# Patient Record
Sex: Male | Born: 1940
Health system: Southern US, Community
[De-identification: ages and names within clinical notes are randomized; demographics above are authoritative.]

## PROBLEM LIST (undated history)

## (undated) DIAGNOSIS — X32XXXA Exposure to sunlight, initial encounter: Secondary | ICD-10-CM

## (undated) DIAGNOSIS — I739 Peripheral vascular disease, unspecified: Secondary | ICD-10-CM

## (undated) DIAGNOSIS — M479 Spondylosis, unspecified: Secondary | ICD-10-CM

## (undated) DIAGNOSIS — I6529 Occlusion and stenosis of unspecified carotid artery: Secondary | ICD-10-CM

## (undated) DIAGNOSIS — I10 Essential (primary) hypertension: Secondary | ICD-10-CM

## (undated) DIAGNOSIS — M5430 Sciatica, unspecified side: Secondary | ICD-10-CM

## (undated) DIAGNOSIS — E785 Hyperlipidemia, unspecified: Secondary | ICD-10-CM

## (undated) DIAGNOSIS — K5790 Diverticulosis of intestine, part unspecified, without perforation or abscess without bleeding: Secondary | ICD-10-CM

## (undated) DIAGNOSIS — E538 Deficiency of other specified B group vitamins: Secondary | ICD-10-CM

## (undated) DIAGNOSIS — L570 Actinic keratosis: Secondary | ICD-10-CM

## (undated) DIAGNOSIS — I998 Other disorder of circulatory system: Secondary | ICD-10-CM

## (undated) DIAGNOSIS — K635 Polyp of colon: Secondary | ICD-10-CM

## (undated) HISTORY — DX: Occlusion and stenosis of unspecified carotid artery: I65.29

## (undated) HISTORY — DX: Spondylosis, unspecified: M47.9

## (undated) HISTORY — DX: Hyperlipidemia, unspecified: E78.5

## (undated) HISTORY — DX: Diverticulosis of intestine, part unspecified, without perforation or abscess without bleeding: K57.90

## (undated) HISTORY — DX: Actinic keratosis: L57.0

## (undated) HISTORY — DX: Sciatica, unspecified side: M54.30

## (undated) HISTORY — DX: Exposure to sunlight, initial encounter: X32.XXXA

## (undated) HISTORY — DX: Deficiency of other specified B group vitamins: E53.8

## (undated) HISTORY — DX: Essential (primary) hypertension: I10

## (undated) HISTORY — DX: Polyp of colon: K63.5

## (undated) HISTORY — PX: NECK MASS EXCISION: SHX2079

---

## 2008-10-04 ENCOUNTER — Ambulatory Visit: Payer: Self-pay | Admitting: Family Medicine

## 2008-10-04 DIAGNOSIS — F17200 Nicotine dependence, unspecified, uncomplicated: Secondary | ICD-10-CM

## 2008-10-04 DIAGNOSIS — K625 Hemorrhage of anus and rectum: Secondary | ICD-10-CM

## 2008-10-04 DIAGNOSIS — I10 Essential (primary) hypertension: Secondary | ICD-10-CM

## 2008-10-09 ENCOUNTER — Encounter: Admission: RE | Admit: 2008-10-09 | Discharge: 2008-10-09 | Payer: Self-pay | Admitting: Family Medicine

## 2008-10-15 ENCOUNTER — Ambulatory Visit: Payer: Self-pay | Admitting: Internal Medicine

## 2008-10-29 ENCOUNTER — Ambulatory Visit: Payer: Self-pay | Admitting: Internal Medicine

## 2008-10-29 ENCOUNTER — Encounter: Payer: Self-pay | Admitting: Internal Medicine

## 2008-10-31 ENCOUNTER — Encounter: Payer: Self-pay | Admitting: Internal Medicine

## 2008-11-01 ENCOUNTER — Ambulatory Visit: Payer: Self-pay | Admitting: Family Medicine

## 2008-11-29 ENCOUNTER — Encounter: Payer: Self-pay | Admitting: Family Medicine

## 2008-12-02 LAB — CONVERTED CEMR LAB
Albumin: 4.2 g/dL (ref 3.5–5.2)
Alkaline Phosphatase: 43 units/L (ref 39–117)
BUN: 18 mg/dL (ref 6–23)
CO2: 20 meq/L (ref 19–32)
Calcium: 9.3 mg/dL (ref 8.4–10.5)
Cholesterol: 265 mg/dL — ABNORMAL HIGH (ref 0–200)
Glucose, Bld: 91 mg/dL (ref 70–99)
HDL: 61 mg/dL (ref >39)
Potassium: 4.6 meq/L (ref 3.5–5.3)
Sodium: 141 meq/L (ref 135–145)
Total Protein: 7 g/dL (ref 6.0–8.3)
Triglycerides: 178 mg/dL — ABNORMAL HIGH (ref <150)

## 2009-01-29 ENCOUNTER — Ambulatory Visit: Payer: Self-pay | Admitting: Family Medicine

## 2009-01-29 DIAGNOSIS — L570 Actinic keratosis: Secondary | ICD-10-CM

## 2009-01-29 DIAGNOSIS — E785 Hyperlipidemia, unspecified: Secondary | ICD-10-CM | POA: Insufficient documentation

## 2009-01-30 ENCOUNTER — Encounter: Payer: Self-pay | Admitting: Family Medicine

## 2009-01-31 LAB — CONVERTED CEMR LAB
ALT: 11 units/L (ref 0–53)
Direct LDL: 171 mg/dL — ABNORMAL HIGH

## 2009-03-10 ENCOUNTER — Telehealth: Payer: Self-pay | Admitting: Family Medicine

## 2009-03-11 ENCOUNTER — Ambulatory Visit: Payer: Self-pay | Admitting: Family Medicine

## 2009-03-11 DIAGNOSIS — K649 Unspecified hemorrhoids: Secondary | ICD-10-CM | POA: Insufficient documentation

## 2009-03-11 DIAGNOSIS — L538 Other specified erythematous conditions: Secondary | ICD-10-CM

## 2009-04-08 ENCOUNTER — Telehealth: Payer: Self-pay | Admitting: Family Medicine

## 2010-03-27 ENCOUNTER — Encounter: Payer: Self-pay | Admitting: Family Medicine

## 2010-07-07 NOTE — Miscellaneous (Signed)
Summary: Fluzone at [facility]  Clinical Lists Changes  Observations: Added new observation of FLUVAXDUE: 03/27/2011 (03/27/2010 16:37) Added new observation of HDLNXTDUE: 11/29/2013 (03/27/2010 16:37) Added new observation of LDLNXTDUE: 11/29/2013 (03/27/2010 16:37) Added new observation of PSADUE: 11/29/2009 (03/27/2010 16:37) Added new observation of CREATNXTDUE: 11/29/2009 (03/27/2010 16:37) Added new observation of POTASSIUMDUE: 11/29/2009 (03/27/2010 16:37) Added new observation of FLU VAX: given (03/26/2010 16:37)     Flu Vaccine Result Date:  03/26/2010 Flu Vaccine Result:  given Flu Vaccine Next Due:  1 yr

## 2010-07-09 ENCOUNTER — Ambulatory Visit (INDEPENDENT_AMBULATORY_CARE_PROVIDER_SITE_OTHER): Payer: Medicare Other | Admitting: Emergency Medicine

## 2010-07-09 ENCOUNTER — Encounter: Payer: Self-pay | Admitting: Emergency Medicine

## 2010-07-09 DIAGNOSIS — I10 Essential (primary) hypertension: Secondary | ICD-10-CM

## 2010-07-10 ENCOUNTER — Encounter: Payer: Self-pay | Admitting: Family Medicine

## 2010-07-10 ENCOUNTER — Ambulatory Visit (INDEPENDENT_AMBULATORY_CARE_PROVIDER_SITE_OTHER): Payer: Medicare Other | Admitting: Family Medicine

## 2010-07-10 DIAGNOSIS — M5430 Sciatica, unspecified side: Secondary | ICD-10-CM

## 2010-07-10 DIAGNOSIS — I10 Essential (primary) hypertension: Secondary | ICD-10-CM

## 2010-07-10 DIAGNOSIS — E785 Hyperlipidemia, unspecified: Secondary | ICD-10-CM

## 2010-07-11 ENCOUNTER — Encounter: Payer: Self-pay | Admitting: Family Medicine

## 2010-07-13 ENCOUNTER — Ambulatory Visit: Payer: Medicare Other | Admitting: Family Medicine

## 2010-07-13 LAB — CONVERTED CEMR LAB
ALT: 14 units/L (ref 0–53)
AST: 17 units/L (ref 0–37)
CO2: 25 meq/L (ref 19–32)
Calcium: 9.1 mg/dL (ref 8.4–10.5)
Chloride: 102 meq/L (ref 96–112)
Cholesterol: 253 mg/dL — ABNORMAL HIGH (ref 0–200)
Creatinine, Ser: 1.44 mg/dL (ref 0.40–1.50)
PSA: 0.54 ng/mL (ref <=4.00)
Potassium: 4.7 meq/L (ref 3.5–5.3)
Sodium: 139 meq/L (ref 135–145)
Total CHOL/HDL Ratio: 2.9
Total Protein: 7.3 g/dL (ref 6.0–8.3)

## 2010-07-15 NOTE — Assessment & Plan Note (Signed)
Summary: f/u HTN   Vital Signs:  Patient profile:   70 year old male Height:      71.6 inches Weight:      199 pounds BMI:     27.39 O2 Sat:      98 % on Room air Pulse rate:   61 / minute BP sitting:   157 / 79  (left arm) Cuff size:   large  Vitals Entered By: Payton Spark CMA (July 10, 2010 11:40 AM)  O2 Flow:  Room air CC: F/U UC and BP   Primary Care Provider:  Seymour Bars DO  CC:  F/U UC and BP.  History of Present Illness: 70 yo WM with hx of HTN and hyperlipidemia presents 1 day after visit to UC for HIGH BP.  He ran out of meds 2 mos prior due to insurance no longer covering his RX for Lotrel.  He did not call here to let me know he needed a change.  He was having HAs but no blurry vision or SOB.  Denies palpitations or edema.    He was put back on Amlodopine + Benazepril separately which he took last night and this AM and is starting to feel better.  He is overdue for labs.    He also has R sided LBP with radiation down the leg on and off x mos.  No injury.  Taking a lot of ibuprofen.  Current Medications (verified): 1)  Bayer Aspirin Ec Low Dose 81 Mg Tbec (Aspirin) .Marland Kitchen.. 1 Tab By Mouth Daily 2)  Simvastatin 20 Mg Tabs (Simvastatin) .Marland Kitchen.. 1 Tab By Mouth Qhs 3)  Nystatin  Powd (Nystatin) .... Apply To Rash Bid 4)  Anucort-Hc 25 Mg Supp (Hydrocortisone Acetate) .Marland Kitchen.. 1 Supp Pr Two Times A Day As Needed Hemorrhoids 5)  Amlodipine Besylate 5 Mg Tabs (Amlodipine Besylate) .Marland Kitchen.. 1 By Mouth Daily 6)  Lotensin 20 Mg Tabs (Benazepril Hcl) .Marland Kitchen.. 1 By Mouth Daily  Allergies (verified): 1)  ! * Pollen  Past History:  Past Medical History: Reviewed history from 07/09/2010 and no changes required. HTN 2010 Stress Test: Spring 09, normal Hyperlipidemia  Social History: Reviewed history from 10/04/2008 and no changes required. Retired Engineer, maintenance. 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 HS degree.  Has 2 grown kids, son in [place] and daughter in [place] with 3  granddaughters. Enjoys fishing.  Lives alone. Smokes 1 ppd x 50 yrs. 1-2 ETOH/ day. Works out at [employer] 3 x a wk.  Review of Systems      See HPI  Physical Exam  General:  alert, well-developed, well-nourished, well-hydrated, and overweight-appearing.   Head:  normocephalic, atraumatic, and male-pattern balding.   Eyes:  pupils equal, pupils round, and pupils reactive to light.  wears glasses Mouth:  pharynx pink and moist.   Neck:  no masses.   Lungs:  normal respiratory effort and normal breath sounds.   Heart:  normal rate, regular rhythm, and no murmur.   Msk:  tender R sciatic notch, + R seated straight leg raise and tender R transverse process at L5-S1 Extremities:  no LE edema Neurologic:  gait normal.   Skin:  color normal.   Psych:  good eye contact, not anxious appearing, and not depressed appearing.     Impression & Recommendations:  Problem # 1:  ESSENTIAL HYPERTENSION, BENIGN (ICD-401.1) BP was HIGH w/o meds -- had stopped due to cost.  Slightly better today, 1 day after visit at [facility] for HTN.  He is doing well on Amlodopine 5 + Benazepril 20/ day but will add HCTZ daily, update labs today and schedule medicare physical in 3 wks. The following medications were removed from the medication list:    Lotrel 5-20 Mg Caps (Amlodipine besy-benazepril hcl) .Marland Kitchen... 1 tab by mouth once daily His updated medication list for this problem includes:    Amlodipine Besylate 5 Mg Tabs (Amlodipine besylate) .Marland Kitchen... 1 by mouth daily    Lotensin 20 Mg Tabs (Benazepril hcl) .Marland Kitchen... 1 by mouth daily    Hydrochlorothiazide 25 Mg Tabs (Hydrochlorothiazide) .Marland Kitchen... 1 tab by mouth daily  Orders: T-Comprehensive Metabolic Panel ([id number])  Problem # 2:  HYPERLIPIDEMIA (ICD-272.4) Overdue for labs.  Obtain today prior to sending in further RFs. His updated medication list for this problem includes:    Simvastatin 20 Mg Tabs (Simvastatin) .Marland Kitchen... 1 tab by mouth qhs  Orders: T-Lipid Profile  [phone])  Labs Reviewed: SGOT: 14 (01/30/2009)   SGPT: 11 (01/30/2009)   HDL:61 (11/29/2008)  LDL:168 (11/29/2008)  Chol:265 (11/29/2008)  Trig:178 (11/29/2008)  Problem # 3:  SCIATICA (ICD-724.3) R sided sciatica, chronic.  H/O given to pt on this condition.  He would benefit from regular exercise.  I'd like him to stop overuse of NSAIDs given HTN and change to Tylenol arthritis.  Plan further imaging/ tx if not improving. His updated medication list for this problem includes:    Bayer Aspirin Ec Low Dose 81 Mg Tbec (Aspirin) .Marland Kitchen... 1 tab by mouth daily  Complete Medication List: 1)  Bayer Aspirin Ec Low Dose 81 Mg Tbec (Aspirin) .Marland Kitchen.. 1 tab by mouth daily 2)  Simvastatin 20 Mg Tabs (Simvastatin) .Marland Kitchen.. 1 tab by mouth qhs 3)  Anucort-hc 25 Mg Supp (Hydrocortisone acetate) .Marland Kitchen.. 1 supp pr two times a day as needed hemorrhoids 4)  Amlodipine Besylate 5 Mg Tabs (Amlodipine besylate) .Marland Kitchen.. 1 by mouth daily 5)  Lotensin 20 Mg Tabs (Benazepril hcl) .Marland Kitchen.. 1 by mouth daily 6)  Hydrochlorothiazide 25 Mg Tabs (Hydrochlorothiazide) .Marland Kitchen.. 1 tab by mouth daily  Other Orders: T-PSA Total (Medicare Screen Only) ([id number])  Patient Instructions: 1)  Take Benazepril and HCTZ in the morning for high blood pressure and Amlodopine at night for high blood pressure. 2)  Update labs today. 3)  Will call you w/ results on Monday. 4)  Avoid OTC Ibuprofen or Aleve. 5)  Use Tylenol arthritis as needed for back pain/ sciatica and start exercising on a regular basis. 6)  REturn for a MEDICARE PHYSICAL in 3 wks. Prescriptions: HYDROCHLOROTHIAZIDE 25 MG TABS (HYDROCHLOROTHIAZIDE) 1 tab by mouth daily  #30 x 1   Entered and Authorized by:   Seymour Bars DO   Signed by:   Seymour Bars DO on 07/10/2010   Method used:   Electronically to        [facility]* (retail)       [address] [place], [place]  [address]       Ph: [phone]       Fax: [phone]   RxID:   [id number]    Orders  Added: 1)  T-Comprehensive Metabolic Panel [[id number]] 2)  T-Lipid Profile [phone] 3)  T-PSA Total (Medicare Screen Only) [[id number]] 4)  Est. Patient Level IV [93235]

## 2010-07-15 NOTE — Assessment & Plan Note (Signed)
Summary: High Blood Pressure?nh   Vital Signs:  Patient profile:   70 year old male Weight:      199 pounds BMI:     27.39 O2 Sat:      99 % on Room air Temp:     98.7 degrees F oral Pulse rate:   65 / minute Resp:     16 per minute BP sitting:   214 / 101  (left arm) Cuff size:   large  Vitals Entered By: Lajean Saver RN (July 09, 2010 2:39 PM)  O2 Flow:  Room air CC: hypertension Is Patient Diabetic? No Pain Assessment Patient in pain? no      Comments Patient has been off of his antihypertensive medication for about 2 months. Unable to see Dr. Cathey Endow today.    Allergies (verified): 1)  ! * Pollen  Past History:  Past Medical History: HTN 2010 Stress Test: Spring 09, normal Hyperlipidemia  Past Surgical History: none  Family History: Reviewed history from 10/04/2008 and no changes required. father died AMI, 86 brother alive, AMI at 73 mother alive, dementia 3 sisters alive, 1 disabled with spine problem  Social History: Reviewed history from 10/04/2008 and no changes required. Retired Engineer, maintenance. 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 HS degree.  Has 2 grown kids, son in [place] and daughter in [place] with 3 granddaughters. Enjoys fishing.  Lives alone. Smokes 1 ppd x 50 yrs. 1-2 ETOH/ day. Works out at [employer] 3 x a wk.   Physical Exam  General:  Well-developed,well-nourished,in no acute distress; alert,appropriate and cooperative throughout examination Head:  Normocephalic and atraumatic  Eyes:  PERRLA EOMI Lungs:  Normal respiratory effort, chest expands symmetrically. Lungs are clear to auscultation, no crackles or wheezes. Heart:  Normal rate and regular rhythm. S1 and S2 normal without gallop, murmur, click, rub or other extra sounds. Extremities:  FROM all 4 ext Neurologic:  CN2-12 intact, full strength and normal sensation all 4 extremities Skin:  Intact without suspicious lesions or rashes Psych:  Cognition and judgment  appear intact. Alert and cooperative with normal attention span and concentration. No apparent delusions, illusions, hallucinations  History of Present Illness Chief Complaint: hypertension History of Present Illness: He has been checking his BP at home on his machine and has noticed 200/100 frequently over the past 1-2 weeks.   He hasn't taken his BP med in almost 2 months due to the price.  He was on Amlodipine/Benzapril and the monthly price went from $5 to $90 and he cannot afford it.  He has no symptoms.  No CP, SOB, HA, dizziness, confusion, slurred speech, weakness.    Impression & Recommendations:  Problem # 1:  ESSENTIAL HYPERTENSION, BENIGN (ICD-401.1) Since he is unable to currently afford Lotrel, we separated it into Norvasc + Lotensin which should only be about $5 per month.  Since he has no alarming symptoms, he will restart his meds tonight.  We have him scheduled tomorrow morning with his PCP to recheck his BP.  I have also asked him to be fasting for the appt in case Dr. Cathey Endow would like to do any labwork.  I have written for only 1 month of meds and will leave it to his PCP to decide on a definitive treatment.  I have given him strict ER precautions (go to ER for any CP, SOB, confusion, blurry vision, weakness, or any stroke/MI symptoms).  I have also told him that if he is getting ready to run  out of any medicine, he needs to inform his PCP immediately so that a refill can be called in.  He understands and agrees.  His updated medication list for this problem includes:    Amlodipine Besylate 5 Mg Tabs (Amlodipine besylate) .Marland Kitchen... 1 by mouth daily    Lotensin 20 Mg Tabs (Benazepril hcl) .Marland Kitchen... 1 by mouth daily  Complete Medication List: 1)  Bayer Aspirin Ec Low Dose 81 Mg Tbec (Aspirin) .Marland Kitchen.. 1 tab by mouth daily 2)  Lotrel 5-20 Mg Caps (Amlodipine besy-benazepril hcl) .Marland Kitchen.. 1 tab by mouth once daily 3)  Simvastatin 20 Mg Tabs (Simvastatin) .Marland Kitchen.. 1 tab by mouth qhs 4)  Nystatin Powd  (Nystatin) .... Apply to rash bid 5)  Anucort-hc 25 Mg Supp (Hydrocortisone acetate) .Marland Kitchen.. 1 supp pr two times a day as needed hemorrhoids 6)  Amlodipine Besylate 5 Mg Tabs (Amlodipine besylate) .Marland Kitchen.. 1 by mouth daily 7)  Lotensin 20 Mg Tabs (Benazepril hcl) .Marland Kitchen.. 1 by mouth daily Prescriptions: LOTENSIN 20 MG TABS (BENAZEPRIL HCL) 1 by mouth daily  #30 x 0   Entered and Authorized by:   Hoyt Koch MD   Signed by:   Hoyt Koch MD on 07/09/2010   Method used:   Print then Give to Patient   RxID:   [id number] AMLODIPINE BESYLATE 5 MG TABS (AMLODIPINE BESYLATE) 1 by mouth daily  #30 x 0   Entered and Authorized by:   Hoyt Koch MD   Signed by:   Hoyt Koch MD on 07/09/2010   Method used:   Print then Give to Patient   RxID:   [id number]    Orders Added: 1)  New Patient Level III [72536]

## 2010-08-03 ENCOUNTER — Ambulatory Visit (INDEPENDENT_AMBULATORY_CARE_PROVIDER_SITE_OTHER): Payer: Medicare Other | Admitting: Family Medicine

## 2010-08-03 ENCOUNTER — Encounter: Payer: Self-pay | Admitting: Family Medicine

## 2010-08-03 DIAGNOSIS — Z0000 Encounter for general adult medical examination without abnormal findings: Secondary | ICD-10-CM

## 2010-08-03 DIAGNOSIS — R42 Dizziness and giddiness: Secondary | ICD-10-CM | POA: Insufficient documentation

## 2010-08-03 DIAGNOSIS — M5136 Other intervertebral disc degeneration, lumbar region: Secondary | ICD-10-CM | POA: Insufficient documentation

## 2010-08-04 ENCOUNTER — Other Ambulatory Visit: Payer: Self-pay | Admitting: Family Medicine

## 2010-08-04 ENCOUNTER — Ambulatory Visit
Admission: RE | Admit: 2010-08-04 | Discharge: 2010-08-04 | Disposition: A | Discharge status: Home / Self Care | Payer: Medicare Other | Source: Ambulatory Visit | Attending: Family Medicine | Admitting: Family Medicine

## 2010-08-04 DIAGNOSIS — R05 Cough: Secondary | ICD-10-CM

## 2010-08-04 DIAGNOSIS — M5430 Sciatica, unspecified side: Secondary | ICD-10-CM

## 2010-08-04 DIAGNOSIS — R0789 Other chest pain: Secondary | ICD-10-CM

## 2010-08-04 DIAGNOSIS — M79604 Pain in right leg: Secondary | ICD-10-CM

## 2010-08-04 DIAGNOSIS — R059 Cough, unspecified: Secondary | ICD-10-CM

## 2010-08-11 ENCOUNTER — Encounter: Payer: Self-pay | Admitting: Family Medicine

## 2010-08-12 ENCOUNTER — Encounter (INDEPENDENT_AMBULATORY_CARE_PROVIDER_SITE_OTHER): Payer: Medicare Other

## 2010-08-12 ENCOUNTER — Encounter: Payer: Self-pay | Admitting: Family Medicine

## 2010-08-12 DIAGNOSIS — I6529 Occlusion and stenosis of unspecified carotid artery: Secondary | ICD-10-CM

## 2010-08-12 DIAGNOSIS — I739 Peripheral vascular disease, unspecified: Secondary | ICD-10-CM

## 2010-08-13 ENCOUNTER — Telehealth: Payer: Self-pay | Admitting: Family Medicine

## 2010-08-18 NOTE — Miscellaneous (Signed)
Summary: Orders Update  Clinical Lists Changes  Orders: Added new Test order of Arterial Duplex Lower Extremity (Arterial Duplex Low) - Signed 

## 2010-08-18 NOTE — Progress Notes (Signed)
Summary: Vascular and Vein appt info  Phone Note From Other Clinic   Caller: Receptionist Reason for Call: Schedule Patient Appt Summary of Call: Patient has been scheduled with Vascular & Vein located on [facility] in [place] for 08/28/10 Friday at 11:15 and I spoke with Drinda Butts from that office and faxed her all the info.Marland KitchenMarland KitchenMichaelle Copas  August 13, 2010 12:06 PM  Initial call taken by: Michaelle Copas,  August 13, 2010 12:07 PM  Follow-up for Phone Call        OK.  Michelle, pls let pt know that if he develops dizziness, lightheadedness or chest pain, SOB, he needs to go to the ED.   Follow-up by: Seymour Bars DO,  August 13, 2010 12:08 PM     Appended Document: Vascular and Vein appt info Pt states he is experiencing increased dizziness, lightheadedness and numbness. I advised Pt to go to ED today. Pt verbalized understanding and importance of going. Pt did agree to go.

## 2010-08-18 NOTE — Miscellaneous (Signed)
Summary: Orders Update  Clinical Lists Changes  Orders: Added new Test order of Carotid Duplex (Carotid Duplex) - Signed 

## 2010-08-18 NOTE — Assessment & Plan Note (Signed)
Summary: Medicare Wellness   Vital Signs:  Patient profile:   70 year old male Height:      71.6 inches Weight:      196 pounds BMI:     26.98 O2 Sat:      97 % on Room air Pulse rate:   67 / minute BP sitting:   130 / 79  (left arm) Cuff size:   large  Vitals Entered By: Payton Spark CMA (August 03, 2010 1:31 PM)  O2 Flow:  Room air CC: Medicare Wellness Exam  Vision Screening:Left eye with correction: 20 / 25 Right eye with correction: 20 / 200 Both eyes with correction: 20 / 25        Vision Entered By: Payton Spark CMA (August 03, 2010 1:31 PM)   Primary Care Provider:  Seymour Bars DO  CC:  Medicare Wellness Exam.  History of Present Illness: I have personally reviewed the Medicare Annual Wellness questionnaire and have noted 1.   The patient's medical and social history 2.   Their use of alcohol, tobacco or illicit drugs 3.   Their current medications and supplements 4.   The patient's functional ability including ADL's, fall risks, home safety risks and hearing or visual             impairment. 5.   Diet and physical activities 6.   Evidence for depression or mood disorders The patients weight, height, BMI and visual acuity have been recorded in the chart I have made referrals, counseling and provided education to the patient based review of the above and I have provided the pt with a written personalized care plan for preventive services.     Habits & Providers  Alcohol-Tobacco-Diet     Tobacco Status: never     Tobacco Counseling: to quit use of tobacco products  Problems Prior to Update: 1)  Sciatica  (ICD-724.3) 2)  Intertrigo  (ICD-695.89) 3)  Hemorrhoids  (ICD-455.6) 4)  Solar Keratosis  (ICD-702.0) 5)  Hyperlipidemia  (ICD-272.4) 6)  Special Screening Malignant Neoplasm of Prostate  (ICD-V76.44) 7)  Essential Hypertension, Benign  (ICD-401.1) 8)  Rectal Bleeding  (ICD-569.3) 9)  Cigarette Smoker  (ICD-305.1)  Medications Prior to  Update: 1)  Bayer Aspirin Ec Low Dose 81 Mg Tbec (Aspirin) .Marland Kitchen.. 1 Tab By Mouth Daily 2)  Pravastatin Sodium 80 Mg Tabs (Pravastatin Sodium) .Marland Kitchen.. 1 Tab By Mouth Qhs 3)  Anucort-Hc 25 Mg Supp (Hydrocortisone Acetate) .Marland Kitchen.. 1 Supp Pr Two Times A Day As Needed Hemorrhoids 4)  Amlodipine Besylate 5 Mg Tabs (Amlodipine Besylate) .Marland Kitchen.. 1 By Mouth Daily 5)  Lotensin 20 Mg Tabs (Benazepril Hcl) .Marland Kitchen.. 1 By Mouth Daily 6)  Hydrochlorothiazide 25 Mg Tabs (Hydrochlorothiazide) .Marland Kitchen.. 1 Tab By Mouth Daily  Current Medications (verified): 1)  Bayer Aspirin Ec Low Dose 81 Mg Tbec (Aspirin) .Marland Kitchen.. 1 Tab By Mouth Daily 2)  Pravastatin Sodium 80 Mg Tabs (Pravastatin Sodium) .Marland Kitchen.. 1 Tab By Mouth Qhs 3)  Anamantle Hc Gel .... Apply To Hemorrhoids 1-2 X A Day As Needed 4)  Amlodipine Besylate 5 Mg Tabs (Amlodipine Besylate) .Marland Kitchen.. 1 By Mouth Daily 5)  Lotensin 20 Mg Tabs (Benazepril Hcl) .Marland Kitchen.. 1 By Mouth Daily 6)  Hydrochlorothiazide 25 Mg Tabs (Hydrochlorothiazide) .Marland Kitchen.. 1 Tab By Mouth Daily  Allergies: 1)  ! * Pollen  Directives (verified): 1)  Discussed - No Decision Made   Past History:  Past Medical History: Last updated: 07/09/2010 HTN 2010 Stress Test: Spring 09, normal Hyperlipidemia  Past Surgical History: Last updated: 07/09/2010 none  Family History: Last updated: 2008-10-24 father died AMI, 41 brother alive, AMI at 24 mother alive, dementia 3 sisters alive, 1 disabled with spine problem  Social History: Last updated: 10-24-2008 Retired Engineer, maintenance. 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 HS degree.  Has 2 grown kids, son in [place] and daughter in [place] with 3 granddaughters. Enjoys fishing.  Lives alone. Smokes 1 ppd x 50 yrs. 1-2 ETOH/ day. Works out at [employer] 3 x a wk.  Risk Factors: Smoking Status: never (08/03/2010)  Social History: Smoking Status:  never  Physical Exam  General:  alert, well-developed, well-nourished, and well-hydrated.   Head:  normocephalic,  atraumatic, and male-pattern balding.   Eyes:  pupils equal, pupils round, and pupils reactive to light.   Ears:  no external deformities.   Mouth:  pharynx pink and moist.   Neck:  no masses.  no audible carotid bruits Lungs:  normal respiratory effort and normal breath sounds.   Heart:  normal rate, regular rhythm, and no murmur.   Abdomen:  soft, non-tender, normal bowel sounds, no distention, no masses, no guarding, no hepatomegaly, and no splenomegaly.  no AA bruits Rectal:  ext hemorrhoidal tags with tenderness on DRE, hemoccult neg Prostate:  no nodules, no asymmetry, and 1+ enlarged.   Pulses:  2+ radial and pedal pulses Extremities:  no LE edema Neurologic:  gait normal.   Skin:  color normal.   Psych:  good eye contact, not anxious appearing, and not depressed appearing.     Impression & Recommendations:  Problem # 1:  HEALTH MAINTENANCE EXAM (ICD-V70.0)  Orders: Medicare -1st Annual Wellness Visit [phone]) EKG w/ Interpretation (93000)  Complete Medication List: 1)  Bayer Aspirin Ec Low Dose 81 Mg Tbec (Aspirin) .Marland Kitchen.. 1 tab by mouth daily 2)  Pravastatin Sodium 80 Mg Tabs (Pravastatin sodium) .Marland Kitchen.. 1 tab by mouth qhs 3)  Anamantle Hc Gel  .... Apply to hemorrhoids 1-2 x a day as needed 4)  Amlodipine Besylate 5 Mg Tabs (Amlodipine besylate) .Marland Kitchen.. 1 by mouth daily 5)  Lotensin 20 Mg Tabs (Benazepril hcl) .Marland Kitchen.. 1 by mouth daily 6)  Hydrochlorothiazide 25 Mg Tabs (Hydrochlorothiazide) .Marland Kitchen.. 1 tab by mouth daily  Other Orders: T-DG Chest 2 View (09811) T-DG Lumbar Spine 2-3 Views (72100) T-Vascular Study-Carotids Complete (91478) T-Arterial Duplex Lower Complete (29562)  Patient Instructions: 1)  I have provided you with a copy of your personalized plan for preventive services. Please take the time to review along with your updated medication list. Prescriptions: AMLODIPINE BESYLATE 5 MG TABS (AMLODIPINE BESYLATE) 1 by mouth daily  #30 x 6   Entered and Authorized by:    Seymour Bars DO   Signed by:   Seymour Bars DO on 08/03/2010   Method used:   Electronically to        [facility]* (retail)       [address] [place], [place]  [address]       Ph: [phone]       Fax: [phone]   RxID:   [id number] LOTENSIN 20 MG TABS (BENAZEPRIL HCL) 1 by mouth daily  #30 x 6   Entered and Authorized by:   Seymour Bars DO   Signed by:   Seymour Bars DO on 08/03/2010   Method used:   Electronically to        [facility]* (retail)  [address] [place], [place]  [address]       Ph: [phone]       Fax: [phone]   RxID:   [id number] ANAMANTLE HC GEL apply to hemorrhoids 1-2 x a day as needed  #1 tube x 1   Entered and Authorized by:   Seymour Bars DO   Signed by:   Seymour Bars DO on 08/03/2010   Method used:   Printed then faxed to ...       [facility]* (retail)       [address] [place], [place]  [address]       Ph: [phone]       Fax: [phone]   RxID:   [id number]    Orders Added: 1)  T-DG Chest 2 View [71020] 2)  T-DG Lumbar Spine 2-3 Views [72100] 3)  T-Vascular Study-Carotids Complete [93880] 4)  T-Arterial Duplex Lower Complete [93925] 5)  Medicare -1st Annual Wellness Visit [G0438] 6)  EKG w/ Interpretation [93000]

## 2010-08-24 ENCOUNTER — Other Ambulatory Visit: Payer: Self-pay | Admitting: Family Medicine

## 2010-08-24 DIAGNOSIS — R42 Dizziness and giddiness: Secondary | ICD-10-CM

## 2010-08-24 DIAGNOSIS — I739 Peripheral vascular disease, unspecified: Secondary | ICD-10-CM

## 2010-08-24 DIAGNOSIS — M79609 Pain in unspecified limb: Secondary | ICD-10-CM

## 2010-08-28 ENCOUNTER — Encounter (INDEPENDENT_AMBULATORY_CARE_PROVIDER_SITE_OTHER): Payer: Medicare Other | Admitting: Vascular Surgery

## 2010-08-28 DIAGNOSIS — I6529 Occlusion and stenosis of unspecified carotid artery: Secondary | ICD-10-CM

## 2010-08-31 ENCOUNTER — Telehealth: Payer: Self-pay | Admitting: Cardiovascular Disease

## 2010-08-31 NOTE — Telephone Encounter (Signed)
Patient needs an appointment sooner with Dr. Clifton James. Scheduled for 09/02/10 @ 2:15pm.

## 2010-08-31 NOTE — Telephone Encounter (Signed)
Pt wants to know test results and pt states he continues to have dizziness and and would like to be seen sooner.

## 2010-08-31 NOTE — Consult Note (Signed)
NEW PATIENT CONSULTATION  [name], [name] DOB:  1940-09-05                                       08/28/2010 ZOXWR#:[id number]  REASON FOR CONSULTATION:  Right internal carotid artery stenosis.  HISTORY OF PRESENT ILLNESS:  This is a 70 year old gentleman that recently had seen a screening examination of all his vascular beds and was then told that he has an elevated velocity in his right internal carotid artery.  At that point the patient was referred for evaluation by vascular surgery.  He is unclear whether he truly has a prior history of TIA or stroke.  He previously has been seen in the ER before and told that he had possibly had a stroke but he has never had residual deficits from that.  At this point he notes some dizziness with movements and some bilaterally blurred vision and also some right shoulder girdle numbness along with bilateral peripheral lower extremity neuropathy and occasional paresthesias.  He denies history that is consistent with amaurosis fugax or blindness.  He does note occasionally right facial numbness without droop and denies any hemiplegia.  He has never had any expressive or receptive aphasia.  His carotid disease risk factors included hyperlipidemia, hypertension and he does not have diabetes and he is also a smoker.  Risk factor management includes use of a statin and aspirin and he is on an antihypertensive regimen.  On his neurologic review of systems he noted dizziness, no vertigo.  No seizures.  He notes the paresthesias.  No headache.  No history of dementia or Parkinson's or Alzheimer's.  PAST MEDICAL HISTORY:  Past medical history includes diabetes, sciatica, intertrigo, hemorrhoids, solar keratosis, hyperlipidemia, hypertension and rectal bleeding.  PAST SURGICAL HISTORY:  Past surgical history includes some type of neck cystectomy.  SOCIAL HISTORY:  He is an active smoker one pack a day with greater than 50 pack year  history, drinks maybe one to two beers a day and denies any illicit drug use.  FAMILY HISTORY:  His father died of acute MI at age 70.  Mother is demented but alive.  MEDICATIONS:  Medications include aspirin, Zocor, amlodipine, Lotensin, hydrochlorothiazide, hydrochlorothiazide, Tylenol.  ALLERGIES:  No known drug allergies.  REVIEW OF SYSTEMS:  He noted dizziness, change in eye site, pain in legs with walking, blood clot in vein, arthritis, joint pain, muscle pain, chest tightness, pressure, shortness of breath, blood in stools and constipation.  Otherwise, the rest of his review is negative.  PHYSICAL EXAMINATION:  On the right arm he had a blood pressure 147/75, on the left 151/86, heart rate 67, respirations were 22. On examination he was alert and oriented x3, well-developed, well- nourished, in no apparent distress. On head exam normocephalic, atraumatic. On ENT exam hearing grossly intact.  Nares without any erythema or drainage.  Oropharynx without any erythema or exudate. On eye exam pupils were equal, round, reactive to light.  Extraocular movements were intact. On neck exam supple neck with no nuchal rigidity with no palpable lymphadenopathy. Pulmonary exam symmetric expansion.  Good air movement.  No rales, rhonchi or wheezing. Cardiac exam:  Regular rate and rhythm.  Normal S1, S2.  No murmurs, rubs, thrills, gallops. Vascular exam:  The upper extremities had easily palpable pulses and his carotids had no bruits on either side and had palpable pulses.  The aorta was not palpable due to  his obesity.  Femoral pulses were palpable bilaterally.  However, I did not feel any popliteal, dorsalis pedis pulses. On GI exam he had a soft abdomen, mildly obese, nontender, nondistended, no guarding, no rebound, no hepatosplenomegaly, no masses.  No costovertebral angle tenderness. Musculoskeletal exam:  He had 5/5 strength.  There are no obvious ischemic changes in any  extremity. On neuro exam cranial nerves II-XII were intact.  His motor exam was listed above.  He did have mildly decreased sensation in his lower extremities but he did have intact light touch in both extremities. Even with moving him from a supine position to a sitting position he did have some nystagmus in his eyes.  This was not a true Hallpike maneuver, however. On psych exam his judgment appears to be intact but his mood and affect is somewhat anxious. On skin exam he has some actinic keratoses on his face and arms. Otherwise no other rashes were noted.  The extremities were as listed above. On lymphatic exam no cervical, axillary, or inguinal lymphadenopathy.  I reviewed outside documentation 15 pages.  There is a carotid duplex that was read as 80-99% stenosis.  We repeated this study to verify and also determine the level of the bifurcation.  Peak systolic was up at 386 and on the end diastolic this was in the order of 136 cm/sec.  The plaque appears to extend to the bifurcation in both carotid arteries. This is consistent with an 80-90% stenosis.  The bifurcation is also noted to be in a normal location about the hyoid notch.  MEDICAL DECISION MAKING:  This is a 70 year old gentleman who has an asymptomatic right internal carotid artery stenosis.  It is not clear to me whether or not he truly had a stroke previously.  Regardless he meets the criteria for intervention as he has a greater than 80% stenosis.  At this point he meets criteria for intervention based on ACAS data.  Prior to proceeding with such I routinely obtain coronary artery risk factor stratification and preoperative optimization.  He is already being managed by [facility] health care, so will send him to get reevaluated by his cardiologist there hopefully within the next 2 weeks and then will have him follow-up.  At that point I will go ahead and schedule the carotid endarterectomy.    [name],  MD Electronically Signed  BLC/MEDQ  D:  08/28/2010  T:  08/31/2010  Job:  [id number]

## 2010-09-01 ENCOUNTER — Encounter: Payer: Self-pay | Admitting: *Deleted

## 2010-09-02 ENCOUNTER — Telehealth: Payer: Self-pay | Admitting: Physician Assistant

## 2010-09-02 ENCOUNTER — Encounter: Payer: Self-pay | Admitting: Cardiovascular Disease

## 2010-09-02 ENCOUNTER — Encounter: Payer: Self-pay | Admitting: Cardiology

## 2010-09-02 ENCOUNTER — Ambulatory Visit (INDEPENDENT_AMBULATORY_CARE_PROVIDER_SITE_OTHER): Payer: Medicare Other | Admitting: Cardiovascular Disease

## 2010-09-02 DIAGNOSIS — R079 Chest pain, unspecified: Secondary | ICD-10-CM | POA: Insufficient documentation

## 2010-09-02 DIAGNOSIS — Z01810 Encounter for preprocedural cardiovascular examination: Secondary | ICD-10-CM | POA: Insufficient documentation

## 2010-09-02 LAB — CBC WITH DIFFERENTIAL/PLATELET
Eosinophils Relative: 3 % (ref 0–5)
HCT: 40 % (ref 39.0–52.0)
Hemoglobin: 13.8 g/dL (ref 13.0–17.0)
Lymphocytes Relative: 21 % (ref 12–46)
MCHC: 34.5 g/dL (ref 30.0–36.0)
MCV: 96.6 fL (ref 78.0–100.0)
Monocytes Absolute: 0.9 10*3/uL (ref 0.1–1.0)
Monocytes Relative: 10 % (ref 3–12)
Neutro Abs: 5.8 10*3/uL (ref 1.7–7.7)
WBC: 9 10*3/uL (ref 4.0–10.5)

## 2010-09-02 LAB — BASIC METABOLIC PANEL
CO2: 26 mEq/L (ref 19–32)
Calcium: 9.8 mg/dL (ref 8.4–10.5)
Potassium: 5.2 mEq/L (ref 3.5–5.3)
Sodium: 137 mEq/L (ref 135–145)

## 2010-09-02 LAB — PROTIME-INR: INR: 0.9 (ref <1.50)

## 2010-09-02 NOTE — Patient Instructions (Signed)
Your physician recommends that you schedule a follow-up appointment in 1 month with Dr. Clifton James. Your physician has requested that you have a cardiac catheterization. Cardiac catheterization is used to diagnose and/or treat various heart conditions. Doctors may recommend this procedure for a number of different reasons. The most common reason is to evaluate chest pain. Chest pain can be a symptom of coronary artery disease (CAD), and cardiac catheterization can show whether plaque is narrowing or blocking your heart's arteries. This procedure is also used to evaluate the valves, as well as measure the blood flow and oxygen levels in different parts of your heart. For further information please visit [email or web address] Please follow instruction sheet, as given.

## 2010-09-02 NOTE — Assessment & Plan Note (Signed)
Plan cath for evaluation of CAD. See above.

## 2010-09-02 NOTE — Progress Notes (Signed)
History of Present Illness:69 yo WM with history of tobacco abuse, HTN, hyperlipidemia and recently diagnosed carotid artery disease. He was found to have severe disease in the right ICA. He has recently been seen by Dr. Imogene Burn with Vascular Surgery. Plans are being made for right carotid endarterectomy in the near future. I am asked to see him for cardiac risk assessment prior to the planned surgery. He has had no prior cardiac issues but he does have a strong family history of CAD including his father and brother. He describes tightness across his chest. At rest and  worsened at times with exertion. He has not performed much exercise lately because of his back issues. His chest pain lasts for 1-2 hours, no associated SOB, palpitations, diaphoresis or nausea. No radiation of pain. He continues to smoke, currently 2-3 cigarettes per day.    Past Medical History  Diagnosis Date  . Hypertension   . Hyperlipidemia     Past Surgical History  Procedure Date  . Neck mass excision     Current Outpatient Prescriptions  Medication Sig Dispense Refill  . Acetaminophen (TYLENOL ARTHRITIS PAIN PO) Take by mouth as needed.        Marland Kitchen amLODipine (NORVASC) 5 MG tablet Take 5 mg by mouth daily.        . Aspirin (ASPIR-81 PO) Take by mouth daily.        . benazepril (LOTENSIN) 20 MG tablet Take 20 mg by mouth daily.        . hydrochlorothiazide 25 MG tablet Take 25 mg by mouth daily.        . pravastatin (PRAVACHOL) 80 MG tablet Take 80 mg by mouth at bedtime.        Marland Kitchen DISCONTD: amLODipine (NORVASC) 2.5 MG tablet Take 2.5 mg by mouth daily.       Marland Kitchen DISCONTD: Lidocaine-Hydrocortisone Ace (ANAMANTLE HC RE) Place rectally as directed.          Allergies  Allergen Reactions  . Pollen Extract     History   Social History  . Marital Status: Widowed    Spouse Name: N/A    Number of Children: N/A  . Years of Education: N/A   Occupational History  . Not on file.   Social History Main Topics  . Smoking  status: Current Everyday Smoker    Types: Cigarettes  . Smokeless tobacco: Not on file  . Alcohol Use: Yes  . Drug Use: No  . Sexually Active: Not on file   Other Topics Concern  . Not on file   Social History Narrative  . No narrative on file    Family History  Problem Relation Age of Onset  . Dementia Mother   . Heart attack Father   . Heart attack Brother     Review of Systems:  Positive for chest pain. No SOB, palpitations, dizziness,  near syncope or syncope.  No PND, orthopnea, or Lower extremity edema.   BP 152/70  Pulse 74  Resp 18  Ht 5\' 11"  (1.803 m)  Wt 191 lb 12.8 oz (87 kg)  BMI 26.75 kg/m2  Physical Examination: General: Well developed, well nourished, NAD HEENT: OP clear, mucus membranes moist SKIN: warm, dry. No rashes. Neuro: No focal deficits Musculoskeletal: Muscle strength 5/5 all ext Psychiatric: Mood and affect normal Neck: No JVD, no carotid bruits, no thyromegaly, no lymphadenopathy. Lungs:Clear bilaterally, no wheezes, rhonci, crackles Cardiovascular: Regular rate and rhythm. No murmurs, gallops or rubs. Abdomen:Soft. Bowel sounds present. Non-tender.  Extremities: No lower extremity edema. Pulses are 2 + in the bilateral DP/PT.  EKG:NSR, rate 64 bpm. Incomplete RBBB  ABI normal by recent study.  Carotid dopplers: RICA 80-99%. LICA 40-59%.

## 2010-09-02 NOTE — Assessment & Plan Note (Signed)
Elevated. Will reassess at next visit. No changes today.

## 2010-09-02 NOTE — Telephone Encounter (Signed)
Received call from [facility] labs re: stat labs. Cr is 1.8. Reviewed Dr. Gibson Ramp OV from today. The patient was set up for a cath that is supposed to happen tomorrow. Discussed case with Dr. Myrtis Ser; we have told the patient it may not be the best timing for his cath with his kidney function. We instructed him to hold his HCTZ and Benazepril tomorrow and we will discuss with Dr. Clifton James about repeat labs/timing of upcoming cath. The patient was instructed to come to the ER if he develops CP/SOB in the interim. He expressed understanding and gratitude about update. Told him to call our office if he has not heard anything by the afternoon tomorrow.

## 2010-09-02 NOTE — Assessment & Plan Note (Signed)
Squeezing type chest pain with h/o tobacco abuse, HTN and hyperlipidemia with recent diagnosis of carotid artery disease. Strong family history of CAD. I think that a cardiac cath is indicated before proceeding with the planned surgery. He has a high probability of CAD. Will arrange left heart cath at [facility] in the outpatient cath lab tomorrow. Will get labs today including BMET, CBC and coags.

## 2010-09-02 NOTE — Progress Notes (Signed)
Addended by: Floreen Comber on: 09/02/2010 04:54 PM   Modules accepted: Orders

## 2010-09-03 NOTE — Progress Notes (Signed)
Reviewed and d/w pt. cdm

## 2010-09-03 NOTE — Progress Notes (Signed)
Labs reviewed and d/w pt on phone. cdm

## 2010-09-03 NOTE — Telephone Encounter (Signed)
Patient will come for a repeat BMP on Monday 09/07/10

## 2010-09-03 NOTE — Telephone Encounter (Signed)
I called the pt's cell phone this am and left a voice message. Cath cancelled because his creatinine was 1.8. I would like to hold his HCTZ and Benazepril and repeat BMET early next week, Monday. If creatinine better, would plan his cath for Tuesday or Wednesday of next week. Whitney, can you follow up with him today and arrange the BMET for next week? Thanks, chris

## 2010-09-04 ENCOUNTER — Ambulatory Visit (INDEPENDENT_AMBULATORY_CARE_PROVIDER_SITE_OTHER): Payer: Medicare Other | Admitting: Vascular Surgery

## 2010-09-04 ENCOUNTER — Telehealth: Payer: Self-pay | Admitting: Cardiovascular Disease

## 2010-09-04 DIAGNOSIS — I6529 Occlusion and stenosis of unspecified carotid artery: Secondary | ICD-10-CM

## 2010-09-04 NOTE — Telephone Encounter (Signed)
Pt had an appt today at [facility] and his BP was 185/102 and Dr Imogene Burn wanted him to call the office to check and see if there was something that he needed to do or take until his blood work on Monday

## 2010-09-04 NOTE — Telephone Encounter (Signed)
Spoke with Dr. Clifton James, we will increase Norvasc to 10mg  by mouth daily. I advised patient to recheck his BP at home this afternoon about an hour after taking the extra Norvasc & to call us back if his BP remains elevated.

## 2010-09-05 NOTE — Procedures (Unsigned)
CAROTID DUPLEX EXAM  INDICATION:  Right carotid stenosis.  HISTORY: Diabetes:  No. Cardiac:  No. Hypertension:  Yes. Smoking:  Yes. Previous Surgery:  No. CV History:  Asymptomatic. Amaurosis Fugax No, Paresthesias No, Hemiparesis No.                                      RIGHT               LEFT Brachial systolic pressure: Brachial Doppler waveforms: Vertebral direction of flow:        Antegrade DUPLEX VELOCITIES (cm/sec) CCA peak systolic                   116 ECA peak systolic                   265 ICA peak systolic                   P = 86, P/M = 350 ICA end diastolic                   P = 27, P/M = 136 PLAQUE MORPHOLOGY:                  Mixed PLAQUE AMOUNT:                      Large PLAQUE LOCATION:                    ICA/ECA/bifurcation  IMPRESSION: 1. Right-side-only repeat study. 2. Right internal carotid artery 80% to 99% stenosis with main focal     areas of severity extending from approximately 1.5 cm into the     internal carotid artery to approximately 3 cm into the internal     carotid artery. 3. Right external carotid artery stenosis.  ___________________________________________ [name], MD  AS/MEDQ  D:  08/28/2010  T:  08/28/2010  Job:  [id number]

## 2010-09-07 ENCOUNTER — Ambulatory Visit: Payer: Medicare Other | Admitting: Cardiovascular Disease

## 2010-09-07 ENCOUNTER — Other Ambulatory Visit: Payer: Self-pay | Admitting: Cardiovascular Disease

## 2010-09-07 ENCOUNTER — Telehealth: Payer: Self-pay | Admitting: Cardiovascular Disease

## 2010-09-07 ENCOUNTER — Other Ambulatory Visit: Payer: Medicare Other | Admitting: *Deleted

## 2010-09-07 LAB — BASIC METABOLIC PANEL WITH GFR
BUN: 17 mg/dL (ref 6–23)
CO2: 21 meq/L (ref 19–32)
Calcium: 9.4 mg/dL (ref 8.4–10.5)
Chloride: 102 meq/L (ref 96–112)
Creat: 1.28 mg/dL (ref 0.40–1.50)
Glucose, Bld: 94 mg/dL (ref 70–99)
Potassium: 4.5 meq/L (ref 3.5–5.3)
Sodium: 139 meq/L (ref 135–145)

## 2010-09-07 NOTE — Telephone Encounter (Signed)
We will start [name] back on his HCTZ since his creatinine is back to 1.28 from 1.9

## 2010-09-07 NOTE — Telephone Encounter (Signed)
Patient needs to have blood work drawn & would like to have the BMP drawn @ [facility] in [place]. I will fax the order now.

## 2010-09-07 NOTE — Assessment & Plan Note (Signed)
OFFICE VISIT  [name], [name] DOB:  [birth year]                                       09/04/2010 ONGEX#:[id number]  This is an established patient.  This is a 70 year old gentleman with an asymptomatic right internal carotid artery stenosis that is greater than 80%.  He has had some anginal type symptoms and was seen by Dr. Clifton James with cardiology and felt that he needed a cardiac catheterization.  He apparently was initially scheduled this last Thursday but due to some elevated creatinine the decision was made to hold off, hydrate and possible reschedule for the cardiac catheterization this coming week.  At this point he is continuing to be asymptomatic so mainly this appointment was to discuss the operation.  We are going to delay the procedure until after the cardiac catheterization.  I discussed over a 15-20 minute time period the nature of carotid endarterectomy.  We discussed the risks, benefits and alternatives.  The patient is aware the risks include but are not limited to bleeding, infection, possible stroke, possible myocardial infarction, possible death due to these two, possible cranial nerve injury, possible airway compromise due to hematoma and possible need for future procedures.  He is aware of such.  We discussed in depth the operation, what it entailed and the recovery.  He is aware of these details at this point and has no further questions.  I am going to tentatively schedule him for the week of April 16 pending the outcome of Dr. Gibson Ramp cardiac catheterization.  We discussed also if he ends up getting a drug-eluting stent there is a possibility that I may have to place a drain as an adjunct to try to control any possible bleeding from the procedure on Plavix.  He is aware of such and agrees to proceed forward.    [name], MD Electronically Signed  BLC/MEDQ  D:  09/04/2010  T:  09/07/2010  Job:  [id number]

## 2010-09-08 NOTE — Telephone Encounter (Signed)
Patient is aware of test/lab results. He will resume his HCTZ and will have his catheterization on 09/09/10 @ 11:30am. Spoke with patient's daughter and they are aware.

## 2010-09-09 ENCOUNTER — Inpatient Hospital Stay (HOSPITAL_BASED_OUTPATIENT_CLINIC_OR_DEPARTMENT_OTHER)
Admission: RE | Admit: 2010-09-09 | Discharge: 2010-09-09 | Disposition: A | Discharge status: Home / Self Care | Payer: Medicare Other | Source: Ambulatory Visit | Attending: Cardiovascular Disease | Admitting: Cardiovascular Disease

## 2010-09-09 DIAGNOSIS — Z8249 Family history of ischemic heart disease and other diseases of the circulatory system: Secondary | ICD-10-CM | POA: Insufficient documentation

## 2010-09-09 DIAGNOSIS — I10 Essential (primary) hypertension: Secondary | ICD-10-CM | POA: Insufficient documentation

## 2010-09-09 DIAGNOSIS — I6529 Occlusion and stenosis of unspecified carotid artery: Secondary | ICD-10-CM | POA: Insufficient documentation

## 2010-09-09 DIAGNOSIS — F17200 Nicotine dependence, unspecified, uncomplicated: Secondary | ICD-10-CM | POA: Insufficient documentation

## 2010-09-09 DIAGNOSIS — R079 Chest pain, unspecified: Secondary | ICD-10-CM | POA: Insufficient documentation

## 2010-09-09 DIAGNOSIS — E785 Hyperlipidemia, unspecified: Secondary | ICD-10-CM | POA: Insufficient documentation

## 2010-09-10 ENCOUNTER — Ambulatory Visit: Payer: Medicare Other

## 2010-09-10 NOTE — Procedures (Signed)
NAMEMarland Kitchen  [name], [name] NO.:  [id number]  MEDICAL RECORD NO.:  [id number]          PATIENT TYPE:  LOCATION:                                 FACILITY:  PHYSICIAN:  Verne Carrow, MDDATE OF BIRTH:  [birth year]  DATE OF PROCEDURE:  09/09/2010 DATE OF DISCHARGE:                           CARDIAC CATHETERIZATION   VASCULAR SURGEON:  [name], MD  PROCEDURES PERFORMED: 1. Left heart catheterization. 2. Selective coronary angiography. 3. Left ventricular angiogram.  OPERATOR:  Verne Carrow, MD  INDICATION:  This is a 70 year old Caucasian male with recent diagnosis of severe right internal carotid artery stenosis.  The patient has been seen by Dr. Leonides Sake of vein and vascular specialists with plans for upcoming surgery for the stenosis in the right internal carotid artery. The patient has a personal history of tobacco abuse, hypertension, and hyperlipidemia.  He also has a strong family history of coronary artery disease including his father and brother.  He continues to smoke two to three cigarettes per day.  I saw him in our office for preoperative evaluation.  Given his risk factors and family history of coronary artery disease, as well as his personal history of carotid artery disease, I felt that it was most appropriate to evaluate his chest discomfort with a diagnostic left heart catheterization.  Diagnostic left heart catheterization was planned in our outpatient cath lab.  DETAILS OF PROCEDURE:  The patient was brought to the outpatient cardiac catheterization laboratory after signing informed consent for the procedure.  The right groin was prepped and draped in a sterile fashion. A 1% lidocaine was used for local anesthesia.  A 4-French sheath was inserted into the right femoral artery without difficulty.  Standard diagnostic catheters were used to perform selective coronary angiography.  A pigtail catheter was used to perform a  left ventricular angiogram.  The patient tolerated the procedure well and was taken to the recovery area in stable condition.  HEMODYNAMIC FINDINGS:  Central aortic pressure 144/74.  Left ventricular pressure 146/9/25.  ANGIOGRAPHIC FINDINGS: 1. The left main coronary artery had no evidence of disease. 2. Left anterior descending was a large vessel that coursed to the     apex and gave off several diagonal branches.  There appeared to be     mild 20% stenosis in the proximal left anterior descending artery. 3. Circumflex artery had no obstructive disease. 4. The right coronary artery had mild proximal 20% stenosis. 5. Left ventricular angiogram was performed in the RAO projection and     showed normal left ventricular systolic function with an ejection     fraction of 60%.  IMPRESSION: 1. Mild nonobstructive coronary artery disease. 2. Normal left ventricular systolic function.  RECOMMENDATIONS:  This patient can proceed to his planned carotid artery surgery per Dr. Imogene Burn.  No further cardiac workup is necessary.  In regard to his hypertension, we will restart his ACE inhibitor at a lower dose.     Verne Carrow, MD     CM/MEDQ  D:  09/09/2010  T:  09/10/2010  Job:  [id number]  cc:   Arlys John  Rolena Infante, MD  Electronically Signed by Verne Carrow MD on 09/10/2010 02:09:26 PM

## 2010-09-14 ENCOUNTER — Telehealth: Payer: Self-pay | Admitting: Cardiovascular Disease

## 2010-09-14 NOTE — Telephone Encounter (Signed)
Cath faxed over to [facility] @ [phone] 09/14/10/KM

## 2010-09-17 ENCOUNTER — Telehealth: Payer: Self-pay | Admitting: Family Medicine

## 2010-09-17 ENCOUNTER — Ambulatory Visit (HOSPITAL_COMMUNITY)
Admission: RE | Admit: 2010-09-17 | Discharge: 2010-09-17 | Disposition: A | Discharge status: Home / Self Care | Payer: Medicare Other | Source: Ambulatory Visit | Attending: Vascular Surgery | Admitting: Vascular Surgery

## 2010-09-17 ENCOUNTER — Other Ambulatory Visit: Payer: Self-pay | Admitting: Vascular Surgery

## 2010-09-17 ENCOUNTER — Encounter (HOSPITAL_COMMUNITY)
Admission: RE | Admit: 2010-09-17 | Discharge: 2010-09-17 | Disposition: A | Discharge status: Home / Self Care | Payer: Medicare Other | Source: Ambulatory Visit | Attending: Vascular Surgery | Admitting: Vascular Surgery

## 2010-09-17 DIAGNOSIS — Z01812 Encounter for preprocedural laboratory examination: Secondary | ICD-10-CM | POA: Insufficient documentation

## 2010-09-17 DIAGNOSIS — I6529 Occlusion and stenosis of unspecified carotid artery: Secondary | ICD-10-CM

## 2010-09-17 DIAGNOSIS — Z01818 Encounter for other preprocedural examination: Secondary | ICD-10-CM | POA: Insufficient documentation

## 2010-09-17 DIAGNOSIS — Z01810 Encounter for preprocedural cardiovascular examination: Secondary | ICD-10-CM | POA: Insufficient documentation

## 2010-09-17 LAB — URINALYSIS, ROUTINE W REFLEX MICROSCOPIC
Glucose, UA: NEGATIVE mg/dL
Hgb urine dipstick: NEGATIVE
Specific Gravity, Urine: 1.026 (ref 1.005–1.030)
Urobilinogen, UA: 0.2 mg/dL (ref 0.0–1.0)
pH: 5 (ref 5.0–8.0)

## 2010-09-17 LAB — CBC
HCT: 39.1 % (ref 39.0–52.0)
Hemoglobin: 13.4 g/dL (ref 13.0–17.0)
MCH: 33.6 pg (ref 26.0–34.0)
MCHC: 34.3 g/dL (ref 30.0–36.0)
MCV: 98 fL (ref 78.0–100.0)

## 2010-09-17 LAB — COMPREHENSIVE METABOLIC PANEL
ALT: 59 U/L — ABNORMAL HIGH (ref 0–53)
AST: 29 U/L (ref 0–37)
CO2: 26 mEq/L (ref 19–32)
Calcium: 9.7 mg/dL (ref 8.4–10.5)
Chloride: 100 mEq/L (ref 96–112)
GFR calc Af Amer: 53 mL/min — ABNORMAL LOW (ref >60)
GFR calc non Af Amer: 44 mL/min — ABNORMAL LOW (ref >60)
Sodium: 137 mEq/L (ref 135–145)

## 2010-09-17 LAB — SURGICAL PCR SCREEN: Staphylococcus aureus: NEGATIVE

## 2010-09-17 NOTE — Telephone Encounter (Signed)
Faxed OV, EKG, CXR, Labs, Carotid & Cath to Olegario Messier (per Delice Bison) at [facility] ([phone]).

## 2010-09-21 ENCOUNTER — Inpatient Hospital Stay (HOSPITAL_COMMUNITY)
Admission: RE | Admit: 2010-09-21 | Discharge: 2010-09-22 | DRG: 039 | Disposition: A | Discharge status: Home / Self Care | Payer: Medicare Other | Source: Ambulatory Visit | Attending: Vascular Surgery | Admitting: Vascular Surgery

## 2010-09-21 ENCOUNTER — Other Ambulatory Visit: Payer: Self-pay | Admitting: Vascular Surgery

## 2010-09-21 DIAGNOSIS — Z01810 Encounter for preprocedural cardiovascular examination: Secondary | ICD-10-CM

## 2010-09-21 DIAGNOSIS — J4489 Other specified chronic obstructive pulmonary disease: Secondary | ICD-10-CM | POA: Diagnosis present

## 2010-09-21 DIAGNOSIS — I6529 Occlusion and stenosis of unspecified carotid artery: Principal | ICD-10-CM | POA: Diagnosis present

## 2010-09-21 DIAGNOSIS — E119 Type 2 diabetes mellitus without complications: Secondary | ICD-10-CM | POA: Diagnosis present

## 2010-09-21 DIAGNOSIS — I10 Essential (primary) hypertension: Secondary | ICD-10-CM | POA: Diagnosis present

## 2010-09-21 DIAGNOSIS — I2510 Atherosclerotic heart disease of native coronary artery without angina pectoris: Secondary | ICD-10-CM | POA: Diagnosis present

## 2010-09-21 DIAGNOSIS — F17200 Nicotine dependence, unspecified, uncomplicated: Secondary | ICD-10-CM | POA: Diagnosis present

## 2010-09-21 DIAGNOSIS — J449 Chronic obstructive pulmonary disease, unspecified: Secondary | ICD-10-CM | POA: Diagnosis present

## 2010-09-21 DIAGNOSIS — Z01812 Encounter for preprocedural laboratory examination: Secondary | ICD-10-CM

## 2010-09-21 HISTORY — PX: CAROTID ENDARTERECTOMY: SUR193

## 2010-09-21 LAB — BASIC METABOLIC PANEL
CO2: 25 mEq/L (ref 19–32)
Calcium: 9.5 mg/dL (ref 8.4–10.5)
Chloride: 104 mEq/L (ref 96–112)
Glucose, Bld: 98 mg/dL (ref 70–99)
Sodium: 137 mEq/L (ref 135–145)

## 2010-09-22 LAB — BASIC METABOLIC PANEL
BUN: 12 mg/dL (ref 6–23)
CO2: 25 mEq/L (ref 19–32)
Calcium: 8.4 mg/dL (ref 8.4–10.5)
Chloride: 104 mEq/L (ref 96–112)
Creatinine, Ser: 1.23 mg/dL (ref 0.4–1.5)
GFR calc Af Amer: >60 mL/min (ref >60)
Glucose, Bld: 105 mg/dL — ABNORMAL HIGH (ref 70–99)

## 2010-09-22 LAB — CBC
Hemoglobin: 10.2 g/dL — ABNORMAL LOW (ref 13.0–17.0)
MCH: 33.7 pg (ref 26.0–34.0)
MCHC: 34 g/dL (ref 30.0–36.0)
MCV: 99 fL (ref 78.0–100.0)

## 2010-09-22 NOTE — Op Note (Signed)
[name], [name] NO.:  [id number]  MEDICAL RECORD NO.:  [id number]           PATIENT TYPE:  I  LOCATION:  3310                         FACILITY:  [facility]  PHYSICIAN:  [name], MD       DATE OF BIRTH:  01-13-1941  DATE OF PROCEDURE:  09/21/2010 DATE OF DISCHARGE:                              OPERATIVE REPORT   PROCEDURES: 1. Right carotid endarterectomy with bovine patch angioplasty. 2. Intraoperative carotid duplex.  PREOPERATIVE DIAGNOSIS:  Right internal carotid artery stenosis, 80%.  POSTOPERATIVE DIAGNOSIS:  Right internal carotid artery stenosis, 80%.  SURGEON:  Arlys John L. Imogene Burn, MD  ASSISTANT:  Della Goo, PA-C  ANESTHESIA:  General.  FINDINGS IN THIS CASE:  No intimal flap in the right common carotid artery, internal carotid artery, and external carotid artery at the end of the case. Nearly occluded left internal carotid artery.  SPECIMEN IN THIS CASE:  Carotid plaque which was sent to pathology.  ESTIMATED BLOOD LOSS:  100 mL.  INDICATIONS:  This is a 70 year old gentleman with an asymptomatic right internal carotid artery stenosis.  He had multiple vague neurologic complaints but did not have any frank motor or cognitive defect associated with this internal carotid artery stenosis.  He underwent preoperative evaluation beforehand including a cardiac catheterization which delayed his procedure.  The cardiac catheterization was deemed to be negative and he needed no preoperative revascularization.  The patient is aware of the risks of this procedure including bleeding, infection, stroke, myocardial infarction, possible death related to the two previous, possible cranial nerve injury, possible need for additional procedures, and possibility of a neck hematoma.  He was aware of these risks and agreed to proceed forward.  DESCRIPTION OF OPERATION:  After full informed written consent had been obtained from the patient, he was brought  back to the operating room and placed supine upon the operating table.  Prior to induction, he had received IV antibiotics.  He was then prepped and draped in the standard fashion for a right carotid endarterectomy, after obtaining adequate anesthesia. I made a longitudinal incision over the anterior border of his sternocleidomastoid.  Using blunt dissection and electrocautery, I developed a plane down to the platysmas which was divided with electrocautery, then dissected down to internal jugular vein that was dissected laterally.  I identified the facial vein.  This was ligated with 2 silk ties and then transected.  This helped me find the bifurcation, which was lower than usual. I extended the dissection more proximally and  was able to dissect around the common carotid artery, and placed a umbilical  tape around the common carotid artery.  A Rumel tourniquet was loosely applied  to this.  Then I dissected in a periadventitial fashion, taking care to avoid  the vagus nerve which was obviously anterior and lateral in this patient.   I was able to dissect out the common carotid artery up to the bifurcation  and then carried the dissection onto the internal carotid artery.  There  appeared to be an area of focal stenosis in the common carotid artery just  proximal to the bifurcation and then an area of stenosis within the internal  carotid artery.  I was able to carry the dissection distal to this stenosis and was able to place umbilical tape around this artery, then dissected out the external carotid artery, placed a vessel loop around it and also dissected out the superior thyroid artery.  A tie was placed around this artery.  At this point, I gave the patient a therapeutic dose of heparin, for his weight approximately 7000 units of heparin and then waited 3 minutes.  Meanwhile, I also made certain that his blood pressure was elevated to a systolic in the 140s and then a bovine patch was  soaking during this period.  A 10 size shunt was also prepared. After almost 5 minutes had elapsed, I at that point clamped the external carotid artery, internal carotid artery, then the common carotid artery, and then made an arteriotomy in the common carotid artery.  I extended this  arteriotomy with Potts scissor up into the internal carotid artery past the  area of stenosis.  Note that the internal carotid artery stenosis was quite  tight and I actually had to reenter into the lumen from above rather than  directly through the lesion.  I also transected the lesion more proximally  using the Potts scissor and extended the arteriotomy more proximally onto the common carotid artery.  At this point, I took the 10 shunt and placed it into the common carotid artery and then brought the Rumel down around this shunt.  I allowed the shunt to bleed in a antegrade fashion until it demonstrated a good pulsatile bleeding.  Then I inserted the distal end of the shunt into the internal carotid artery distal to this stenosis and then there was actually a good fit between the shunt and the distal internal carotid artery, so I did not have to go down on the Rumel on this side.  At this point, less than 2 minutes of clamp time had elapsed  and the patient was successfully shunted.  At this point, I started the endarterectomy.  Using a Cytogeneticist, I dissected the plaque  around the common carotid artery and carried down to where it was densely  adherent.  Using scissors then I transected the plaque where it was adherent  and carried the dissection circumferentially.  I then carried this dissection up into the internal carotid artery.  It did not feather initially very well and with some additional dissection with the Penfield, I dissected it anteriorly where the plaque was densely adherent to the internal carotid artery and then at this point the plaque did feather out.  I then released the clamp  on the external carotid artery and everted the external carotid artery and then extracted the plaque from the external carotid artery.  At this point, the entirety of the carotid plaque was passed off the field as a single specimen.  I then spent the next 30 minutes carefully checking for any loose intimal flap or any debris. They were all removed.  I repeatedly tested the internal carotid artery plaque.  The plaque that was present was densely adherent and even with manual testing with a Penfield, I could not get the plaque to lift.  It had feathered out nicely and all of the plaque that was present here was densely adherent.  I then interrogated the rest of this carotid artery picking out any loose intimal debris and then examined the common carotid artery.  I transected the one portion of the plaque that was loose but the rest of this plaque was noted be densely adherent to the common carotid artery.  This was tested also with the [facility] and also the dextran.  This plaque did not lift whatsoever.  At the end of this 30-minute period, I no longer felt there was any residual plaque present that would be a potential risk.  All of the residual debris was densely adherent to the wall and could not be removed without damaging the artery.  At this point, I then took the bovine pericardial patch that was soaking and then fashioned it into the geometry of this arteriotomy.  It was sewn in place with 2 6-0 Prolene stitches, sewn from above and below and tying in the middle. Prior to completing this patch angioplasty, I did extract the shunt first from the internal carotid artery, then I clamped the internal carotid artery, then I extracted it from the common carotid artery and I clamped the common carotid artery.  In such fashion, the shunt was removed.  I allowed the internal carotid to back bleed.  There was good vigorous back bleeding.  There was also good vigorous back bleeding from  the external carotid artery and also good pulsatile bleeding from the common carotid artery.  I then flushed out this patch artery with heparinized saline until there was no blood present.  I completed the patch angioplasty in the usual fashion, tying off the ties and at this point I released the clamp on the external, then I released the clamp on the common carotid artery, and after waiting a few seconds I released the clamp on the internal carotid artery.  Total clamp time for this portion  of the case was less than 3 minutes.   At this point, there was good pulsatile flow in all arteries.  Using continuous Doppler, I tested all arteries and had appropriate flow signatures for each artery.  I then obtained a sterilely draped Sonosite probe and used it to interrogate all 3 carotid arteries.  There was no intimal flap evident on examination in both longitudinal and transverse views.  At this point, I examined the patched artery.  There was one area in the artery that needed to be repaired with a single 6-0 Prolene stitch and there were few areas that needed electrocautery to control the bleeding.  There was one loose branch off the residual facial vein stump that had to be tightened again with silk tie.  I then irrigated out the wound and then gave 15 mg of protamine to reverse the anticoagulation.  At this point, a hour had passed since the initial heparin bolus.  We then also placed thrombin and Gelfoam throughout the wound.  We repeated this process another 2-3 times controlling any venous ooze with electrocautery.  By the end of this case, there were no more active bleeding.  I irrigated out this surgical wound one last time and felt that at this point that hemostasis had been adequately obtained.  The platysma muscle was then reapproximated with running stitch of 3-0 Vicryl.  The skin was then reapproximated with a running subcuticular 4-0 Monocryl and then reinforced with a  Dermabond closure.  The patient was allowed to awaken at this point without any problems.  COMPLICATIONS:  None.  CONDITION:  Stable.     [name], MD     BLC/MEDQ  D:  09/21/2010  T:  09/22/2010  Job:  [id number]  Electronically Signed by Leonides Sake MD on 09/22/2010 10:36:50 AM

## 2010-09-26 ENCOUNTER — Encounter: Payer: Self-pay | Admitting: Cardiovascular Disease

## 2010-09-28 NOTE — Discharge Summary (Signed)
NAMESAULO, [name] NO.:  [id number]  MEDICAL RECORD NO.:  [id number]           PATIENT TYPE:  I  LOCATION:  3310                         FACILITY:  [facility]  PHYSICIAN:  [name], MD       DATE OF BIRTH:  [birth year]  DATE OF ADMISSION:  09/21/2010 DATE OF DISCHARGE:                              DISCHARGE SUMMARY   ADMISSION DIAGNOSIS:  Right internal carotid artery stenosis.  PAST MEDICAL HISTORY AND DISCHARGE DIAGNOSES: 1. Right internal carotid artery stenosis status post right carotid     endarterectomy. 2. Diabetes. 3. Sciatica. 4. Intertrigo. 5. Hemorrhoids. 6. Solar keratosis. 7. Hyperlipidemia. 8. Hypertension. 9. Rectal bleeding.  ALLERGIES:  No known drug allergies.  BRIEF HISTORY:  The patient is a 70 year old male with asymptomatic right internal carotid artery stenosis.  The patient had multiple vague neurologic complaints but does not have any frank motor or cognitive deficits associated with his internal carotid artery stenosis.  He underwent a preoperative evaluation including a cardiac catheterization which delayed his procedure.  The cath was negative and he needed no further preoperative intervention.  The patient was then scheduled for right carotid endarterectomy secondary to greater than 80%  right internal carotid artery stenosis.  HOSPITAL COURSE:  The patient was admitted and taken to the OR on September 21, 2010 for right carotid endarterectomy with bovine patch angioplasty.  The patient tolerated the procedure well and was hemodynamically immediately postoperatively.  He was transferred from the OR to the postanesthesia care unit in stable condition. He was extubated without complication and woke from anesthesia neurologically intact.  On postoperative day #1, the patient complained of mild incisional pain. He was afebrile with stable vital signs.  On physical exam: cardiac, regular rate and rhythm.  Lungs clear  to auscultation. The abdomen is soft, nontender, nondistended.  The neck incision is clean, dry and intact with no evidence of hematoma.  He is neurologically intact with motor and sensation 5/5 in all extremities.  The patient is doing well and he has ambulated and tolerated regular diet without difficulty.  As long as the patient is able to void, he will be able to discharge home on postop day #1 in stable condition.  LABORATORY DATA:  CBC and BMP on September 22, 2010; white count 6.9, hemoglobin 10.2, hematocrit 30, platelets 162.  Sodium 136, potassium 4.3, BUN 12, creatinine 1.3.  DISCHARGE INSTRUCTIONS:  The patient received specific written discharge instructions regarding diet, activity, and wound care.  He will follow up with Dr. Imogene Burn in approximately 2 weeks.  He will be contacted by the office with date and time of that appointment.  DISCHARGE MEDICATIONS: 1. Percocet 5/325 mg 1-2 every 4-6 hours p.r.n. 2. Amlodipine 5 mg daily. 3. Aspirin 81 mg daily. 4. Benazepril 20 mg one-half tablet daily. 5. HCTZ 25 mg daily. 6. Pravastatin 80 mg daily.     Pecola Leisure, PA   ______________________________ [name], MD    AY/MEDQ  D:  09/22/2010  T:  09/22/2010  Job:  [id number]  Electronically Signed by Pecola Leisure PA  on 09/22/2010 10:55:48 AM Electronically Signed by Leonides Sake MD on 09/28/2010 10:14:45 AM

## 2010-09-29 ENCOUNTER — Ambulatory Visit: Payer: Medicare Other | Admitting: Cardiovascular Disease

## 2010-09-30 ENCOUNTER — Ambulatory Visit: Payer: Medicare Other | Admitting: Family Medicine

## 2010-10-09 ENCOUNTER — Ambulatory Visit (INDEPENDENT_AMBULATORY_CARE_PROVIDER_SITE_OTHER): Payer: Medicare Other | Admitting: Vascular Surgery

## 2010-10-09 DIAGNOSIS — I6529 Occlusion and stenosis of unspecified carotid artery: Secondary | ICD-10-CM

## 2010-10-12 NOTE — Assessment & Plan Note (Signed)
OFFICE VISIT  MOHSIN, CRUM DOB:  11-18-1940                                       10/09/2010 MWUXL#:[id number]  This is a postop followup.  HISTORY OF PRESENT ILLNESS:  This is a 70 year old gentleman who presents status post a right carotid endarterectomy.  Since then the patient has had no stroke or TIA symptomatology, but has had a headache. His blood pressure however has been completely at his usual range.  He notes no like neurologic disturbance with these headaches.  At this point the frequency of these headaches is greatly decreased.  PHYSICAL EXAMINATION:  Blood pressure 163/75, heart rate of 56, respirations were 12, temperature 97.4.  On focused examination on neuro exam cranial nerves II-XII were intact.  Motor exam was symmetric, 5/5 strength.  Sensation grossly intact in all extremities.  Right neck is firm along the incision completely intact with no drainage, no erythema.  MEDICAL DECISION MAKING:  This is a 70 year old gentleman who presents now status post a right carotid endarterectomy.  No postoperative stroke.  At this point I emphasized the importance of maximal medical management including continuing aspirin use.  He is going to follow up in 6 months and repeat bilateral carotid duplexes for surveillance purposes and we will see him in 6 months.    [name], MD Electronically Signed  BLC/MEDQ  D:  10/09/2010  T:  10/12/2010  Job:  [id number]

## 2010-11-03 ENCOUNTER — Ambulatory Visit: Payer: Medicare Other | Admitting: Cardiovascular Disease

## 2011-02-23 ENCOUNTER — Encounter: Payer: Self-pay | Admitting: Vascular Surgery

## 2011-04-15 ENCOUNTER — Encounter: Payer: Self-pay | Admitting: Vascular Surgery

## 2011-04-16 ENCOUNTER — Encounter: Payer: Self-pay | Admitting: Vascular Surgery

## 2011-04-16 ENCOUNTER — Ambulatory Visit (INDEPENDENT_AMBULATORY_CARE_PROVIDER_SITE_OTHER): Payer: Medicare Other | Admitting: Vascular Surgery

## 2011-04-16 ENCOUNTER — Other Ambulatory Visit (INDEPENDENT_AMBULATORY_CARE_PROVIDER_SITE_OTHER): Payer: Medicare Other | Admitting: *Deleted

## 2011-04-16 VITALS — BP 204/79 | HR 59 | Resp 20 | Ht 71.0 in | Wt 195.0 lb

## 2011-04-16 DIAGNOSIS — I6529 Occlusion and stenosis of unspecified carotid artery: Secondary | ICD-10-CM

## 2011-04-16 DIAGNOSIS — Z48812 Encounter for surgical aftercare following surgery on the circulatory system: Secondary | ICD-10-CM

## 2011-04-16 NOTE — Progress Notes (Signed)
[facility] [place]  Established Carotid Patient  History of Present Illness  [name] is a 70 y.o. male who presents with chief complaint: routine follow.  S/p R CEA 09/21/10 for asx >80% R ICA stenosis.  Patient has no history of TIA or stroke symptom.  The patient has never had amaurosis fugax or monocular blindness.  The patient has never had facial drooping or hemiplegia.  The patient has never had receptive or expressive aphasia.  The patient notes some residual burning sensation and pain along the surgical incision.  Past Medical History, Past Surgical History, Social History, Family History, Medications, Allergies, and Review of Systems are unchanged from previous evaluation on 08/28/10.  Physical Examination  Filed Vitals:   04/16/11 1633  BP: 204/79  Pulse: 59  Resp: 20  Height: 5\' 11"  (1.803 m)  Weight: 195 lb (88.451 kg)   Body mass index is 27.20 kg/(m^2).  General: A&O x 3, WDWN  Eyes: PERRLA, EOMI  Neck: mild TTP to palpable of right neck incision, well healed  Pulmonary: Sym exp, good air movt, CTAB, no rales, rhonchi, & wheezing  Cardiac: RRR, Nl S1, S2, no Murmurs, rubs or gallops  Vascular: Vessel Right Left  Radial Palpable Palpable  Brachial Palpable Palpable  Carotid Palpable, without bruit Palpable, without bruit  Aorta Non-palpable N/A  Femoral Palpable Palpable  Popliteal Non-palpable Non-palpable  PT Palpable Palpable  DP Non-Palpable Non-Palpable   Gastrointestinal: soft, NTND, -G/R, - HSM, - masses, - CVAT B, mildly obese  Musculoskeletal: M/S 5/5 throughout , Extremities without ischemic changes   Neurologic: CN 2-12 intact , Pain and light touch intact in extremities , Motor exam as listed above  Non-Invasive Vascular Imaging  CAROTID DUPLEX (Date: 04/16/11):   R ICA stenosis: widely patent   R VA: patent and antegrade  L ICA stenosis: 1-39%  L VA: patent and antegrade  Medical Decision Making  [name]  is a 71 y.o. male who presents with: s/p successful R CEA and minimal L ICA stenosis.  Based on the patient's vascular studies and examination, I have offered the patient: surveillance in the form of B carotid duplex in 6 months.   If there is no evidence of restenosis on the right side in 6 months, I would proceed with annual surveillance. The patient may have an element of reflex sympathetic dystrophy at the surgical incision, given his hyperesthesia.  At this point, the patient is not interested in any immediate interventions.  I discussed in depth with the patient the nature of atherosclerosis, and emphasized the importance of maximal medical management including strict control of blood pressure, blood glucose, and lipid levels, obtaining regular exercise, and cessation of smoking.  The patient is aware that without maximal medical management the underlying atherosclerotic disease process will progress, limiting the benefit of any interventions.  Thank you for allowing [place] to participate in this patient's care.  Leonides Sake, MD [facility] [place] Office: [phone] Pager: [phone]  04/16/2011, 5:21 PM

## 2011-04-22 NOTE — Procedures (Unsigned)
CAROTID DUPLEX EXAM  INDICATION:  Follow up right CEA.  HISTORY: Diabetes:  No. Cardiac:  No. Hypertension:  Yes. Smoking:  Yes. Previous Surgery:  Right CEA, 09/21/2010. CV History: Amaurosis Fugax No, Paresthesias No, Hemiparesis No.                                      RIGHT             LEFT Brachial systolic pressure:         180               180 Brachial Doppler waveforms:         WNL               WNL Vertebral direction of flow:        Antegrade         Antegrade DUPLEX VELOCITIES (cm/sec) CCA peak systolic                   102               79 ECA peak systolic                   150               131 ICA peak systolic                   48                82 ICA end diastolic                   17                30 PLAQUE MORPHOLOGY:                                    Heterogenous PLAQUE AMOUNT:                      NA                Minimal PLAQUE LOCATION:                                      ICA  IMPRESSION: 1. Widely patent right carotid endarterectomy without evidence of     hyperplasia or restenosis. 2. 1% to 39% plaquing of the left internal carotid artery. 3. Bilateral vertebral arteries are within normal limits.  ___________________________________________ [name], MD  LT/MEDQ  D:  04/16/2011  T:  04/16/2011  Job:  [id number]

## 2011-05-28 ENCOUNTER — Other Ambulatory Visit: Payer: Self-pay | Admitting: *Deleted

## 2011-05-28 MED ORDER — PRAVASTATIN SODIUM 80 MG PO TABS: 80.0000 mg | ORAL_TABLET | Freq: Every day | ORAL | Status: DC

## 2011-05-28 MED ORDER — BENAZEPRIL HCL 20 MG PO TABS: 20.0000 mg | ORAL_TABLET | Freq: Every day | ORAL | Status: DC

## 2011-07-30 ENCOUNTER — Other Ambulatory Visit: Payer: Self-pay | Admitting: Family Medicine

## 2011-08-02 ENCOUNTER — Other Ambulatory Visit: Payer: Self-pay | Admitting: *Deleted

## 2011-08-02 MED ORDER — AMLODIPINE BESYLATE 5 MG PO TABS: 5.0000 mg | ORAL_TABLET | Freq: Every day | ORAL | Status: DC

## 2011-09-17 ENCOUNTER — Other Ambulatory Visit: Payer: Self-pay | Admitting: Family Medicine

## 2011-09-21 ENCOUNTER — Encounter: Payer: Self-pay | Admitting: Internal Medicine

## 2011-11-04 ENCOUNTER — Other Ambulatory Visit: Payer: Self-pay | Admitting: Physician Assistant

## 2011-11-04 ENCOUNTER — Other Ambulatory Visit: Payer: Self-pay | Admitting: Family Medicine

## 2011-11-09 ENCOUNTER — Other Ambulatory Visit: Payer: Self-pay | Admitting: *Deleted

## 2011-11-09 MED ORDER — BENAZEPRIL HCL 20 MG PO TABS: 20.0000 mg | ORAL_TABLET | Freq: Every day | ORAL | Status: DC

## 2011-11-11 ENCOUNTER — Encounter: Payer: Self-pay | Admitting: Neurosurgery

## 2011-11-12 ENCOUNTER — Ambulatory Visit (INDEPENDENT_AMBULATORY_CARE_PROVIDER_SITE_OTHER): Payer: Medicare Other | Admitting: Vascular Surgery

## 2011-11-12 ENCOUNTER — Encounter: Payer: Self-pay | Admitting: Neurosurgery

## 2011-11-12 ENCOUNTER — Ambulatory Visit (INDEPENDENT_AMBULATORY_CARE_PROVIDER_SITE_OTHER): Payer: Medicare Other | Admitting: Neurosurgery

## 2011-11-12 VITALS — BP 174/85 | HR 61 | Resp 16 | Ht 71.0 in | Wt 194.4 lb

## 2011-11-12 DIAGNOSIS — I6529 Occlusion and stenosis of unspecified carotid artery: Secondary | ICD-10-CM | POA: Insufficient documentation

## 2011-11-12 DIAGNOSIS — Z48812 Encounter for surgical aftercare following surgery on the circulatory system: Secondary | ICD-10-CM

## 2011-11-12 NOTE — Progress Notes (Signed)
[facility] [place] HISTORY AND PHYSICAL   CC: Annual carotid duplex 14 months status post right CEA Referring Physician: Imogene Burn  History of Present Illness: 71 year old male patient of Dr. Imogene Burn who underwent a right CEA in April 2012. Since that time he has been asymptomatic, he reports no signs or symptoms of CVA, TIA, amaurosis fugax or any neural deficit. The patient also denies any new medical diagnoses or recent surgeries.  Past Medical History  Diagnosis Date  . Hypertension     2010  . Hyperlipidemia   . Degenerative arthritis of spine   . Hemorrhoids   . Solar keratosis   . Carotid artery occlusion   . Sciatica   . Colon polyps   . Diverticulosis   . Hemorrhoids     ROS: [x]  Positive   [ ]  Denies    General: [ ]  Weight loss, [ ]  Fever, [ ]  chills Neurologic: [ x] Dizziness, [ ]  Blackouts, [ ]  Seizure [ ]  Stroke, [ ]  "Mini stroke", [ ]  Slurred speech, [ ]  Temporary blindness; [ ]  weakness in arms or legs, [ ]  Hoarseness Cardiac: [ ]  Chest pain/pressure, [ ]  Shortness of breath at rest [ ]  Shortness of breath with exertion, [ ]  Atrial fibrillation or irregular heartbeat Vascular: [ ]  Pain in legs with walking, [ ]  Pain in legs at rest, [ ]  Pain in legs at night,  [ ]  Non-healing ulcer, [ ]  Blood clot in vein/DVT,   Pulmonary: [ ]  Home oxygen, [ ]  Productive cough, [ ]  Coughing up blood, [ ]  Asthma,  [ ]  Wheezing Musculoskeletal:  [ ]  Arthritis, [ ]  Low back pain, [ ]  Joint pain Hematologic: [ ]  Easy Bruising, [ ]  Anemia; [ ]  Hepatitis Gastrointestinal: [ ]  Blood in stool, [ ]  Gastroesophageal Reflux/heartburn, [ ]  Trouble swallowing Urinary: [ ]  chronic Kidney disease, [ ]  on HD - [ ]  MWF or [ ]  TTHS, [ ]  Burning with urination, [ ]  Difficulty urinating Skin: [ ]  Rashes, [ ]  Wounds Psychological: [ ]  Anxiety, [ ]  Depression   Social History History  Substance Use Topics  . Smoking status: Former Smoker -- 1.0 packs/day for 50 years    Types:  Cigarettes    Quit date: 10/14/2010  . Smokeless tobacco: Never Used  . Alcohol Use: 0.0 oz/week    1-2 drink(s) per week    Family History Family History  Problem Relation Age of Onset  . Dementia Mother   . Heart attack Father     First MI age 31.  Marland Kitchen Heart disease Father 5  . Heart attack Brother     CAD, prior MI  . Hypertension Brother     Allergies  Allergen Reactions  . Pollen Extract     Current Outpatient Prescriptions  Medication Sig Dispense Refill  . Acetaminophen (TYLENOL ARTHRITIS PAIN PO) Take by mouth as needed.        Marland Kitchen amLODipine (NORVASC) 5 MG tablet TAKE 1 TABLET BY MOUTH EVERY DAY  30 tablet  0  . Aspirin (ASPIR-81 PO) Take by mouth daily.        . benazepril (LOTENSIN) 20 MG tablet Take 1 tablet (20 mg total) by mouth daily.  30 tablet  0  . hydrochlorothiazide 25 MG tablet Take 25 mg by mouth daily.        . Lidocaine-Hydrocortisone Ace (ANAMANTLE HC RE) Anamantle HC Gel - apply to hemorrhoids 1-2 x a day as needed.       Marland Kitchen  pravastatin (PRAVACHOL) 80 MG tablet TAKE 1 TABLET BY MOUTH EVERY NIGHT AT BEDTIME  30 tablet  0  . simvastatin (ZOCOR) 20 MG tablet Take 20 mg by mouth at bedtime.          Physical Examination  Filed Vitals:   11/12/11 1154  BP: 174/85  Pulse: 61  Resp: 16    Body mass index is 27.11 kg/(m^2).  General:  WDWN in NAD Gait: Normal HEENT: WNL Eyes: Pupils equal Pulmonary: normal non-labored breathing , without Rales, rhonchi,  wheezing Cardiac: RRR, without  Murmurs, rubs or gallops; Abdomen: soft, NT, no masses Skin: no rashes, ulcers noted  Vascular Exam Pulses: 2+ radial pulses bilaterally Carotid bruits: Carotid pulses to auscultation no bruits are heard Extremities without ischemic changes, no Gangrene , no cellulitis; no open wounds;  Musculoskeletal: no muscle wasting or atrophy   Neurologic: A&O X 3; Appropriate Affect ; SENSATION: normal; MOTOR FUNCTION:  moving all extremities equally. Speech is  fluent/normal  Non-Invasive Vascular Imaging CAROTID DUPLEX 11/12/2011  Right ICA 0 - 19% stenosis Left ICA 20 - 39 % stenosis   ASSESSMENT/PLAN: Asymptomatic mild left carotid stenosis 14 months status post right CEA. Patient will followup in one year with repeat carotid duplex and be seen in my clinic. His  questions were encouraged and answered.  Lauree Chandler ANP Clinic MD: Imogene Burn

## 2011-11-12 NOTE — Progress Notes (Signed)
Carotid duplex performed 11/12/2011 @ [facility]

## 2011-11-19 NOTE — Procedures (Unsigned)
CAROTID DUPLEX EXAM  INDICATION:  Carotid stenosis.  HISTORY: Diabetes:  No. Cardiac:  No. Hypertension:  Yes. Smoking:  Currently. Previous Surgery:  Right carotid endarterectomy on 09/21/2010. CV History:  Intermittent right-sided numbness and weakness, episodes of blurry vision. Amaurosis Fugax No, Paresthesias Yes, Hemiparesis No.                                      RIGHT             LEFT Brachial systolic pressure:         182               178 Brachial Doppler waveforms:         WNL               WNL Vertebral direction of flow:        Antegrade         Antegrade DUPLEX VELOCITIES (cm/sec) CCA peak systolic                   101               111 ECA peak systolic                   158               131 ICA peak systolic                   40                93 ICA end diastolic                   14                30 PLAQUE MORPHOLOGY:                  NV                Heterogenous PLAQUE AMOUNT:                      NV                Mild PLAQUE LOCATION:                    NV                CCA/ICA  IMPRESSION: 1. Right internal carotid artery is patent with a history of     endarterectomy, no hypoplasia or hemodynamically significant plaque     visualized. 2. Bilateral external carotid arteries are patent. 3. Left internal carotid artery stenosis present in the 1% to 39%     range. 4. Bilateral vertebrals are patent and antegrade. 5. Essentially unchanged since the previous study on 04/16/2011.  ___________________________________________ [name], MD  SH/MEDQ  D:  11/12/2011  T:  11/12/2011  Job:  [id number]

## 2011-12-07 ENCOUNTER — Other Ambulatory Visit: Payer: Self-pay | Admitting: Family Medicine

## 2011-12-07 ENCOUNTER — Other Ambulatory Visit: Payer: Self-pay | Admitting: Physician Assistant

## 2012-02-03 ENCOUNTER — Other Ambulatory Visit: Payer: Self-pay | Admitting: Family Medicine

## 2012-09-13 ENCOUNTER — Encounter: Payer: Self-pay | Admitting: Internal Medicine

## 2012-11-03 ENCOUNTER — Other Ambulatory Visit: Payer: Self-pay | Admitting: *Deleted

## 2012-11-03 DIAGNOSIS — Z48812 Encounter for surgical aftercare following surgery on the circulatory system: Secondary | ICD-10-CM

## 2012-11-10 ENCOUNTER — Other Ambulatory Visit: Payer: Medicare Other

## 2012-11-10 ENCOUNTER — Ambulatory Visit: Payer: Medicare Other | Admitting: Neurosurgery

## 2013-01-05 ENCOUNTER — Encounter: Payer: Self-pay | Admitting: Sports Medicine

## 2013-01-05 ENCOUNTER — Ambulatory Visit (INDEPENDENT_AMBULATORY_CARE_PROVIDER_SITE_OTHER): Payer: Medicare Other | Admitting: Sports Medicine

## 2013-01-05 VITALS — BP 198/99 | HR 77 | Wt 203.0 lb

## 2013-01-05 DIAGNOSIS — Z23 Encounter for immunization: Secondary | ICD-10-CM

## 2013-01-05 DIAGNOSIS — E785 Hyperlipidemia, unspecified: Secondary | ICD-10-CM

## 2013-01-05 DIAGNOSIS — Z299 Encounter for prophylactic measures, unspecified: Secondary | ICD-10-CM | POA: Insufficient documentation

## 2013-01-05 DIAGNOSIS — I10 Essential (primary) hypertension: Secondary | ICD-10-CM

## 2013-01-05 MED ORDER — HYDROCHLOROTHIAZIDE 25 MG PO TABS: 25.0000 mg | ORAL_TABLET | Freq: Every day | ORAL | Status: DC

## 2013-01-05 MED ORDER — SIMVASTATIN 20 MG PO TABS: 20.0000 mg | ORAL_TABLET | Freq: Every day | ORAL | Status: DC

## 2013-01-05 MED ORDER — AMLODIPINE BESYLATE 5 MG PO TABS: ORAL_TABLET | ORAL | Status: DC

## 2013-01-05 MED ORDER — ASPIRIN 81 MG PO TBEC: 81.0000 mg | DELAYED_RELEASE_TABLET | Freq: Every day | ORAL | Status: DC

## 2013-01-05 MED ORDER — PRAVASTATIN SODIUM 80 MG PO TABS: ORAL_TABLET | ORAL | Status: DC

## 2013-01-05 MED ORDER — BENAZEPRIL HCL 20 MG PO TABS: ORAL_TABLET | ORAL | Status: DC

## 2013-01-05 NOTE — Assessment & Plan Note (Signed)
Colonoscopy 6 years ago, Tdap and Pneumovax today.

## 2013-01-05 NOTE — Progress Notes (Signed)
  Subjective:    CC: Reestablish, hypertension  HPI:  [name] has not been to our practice in several years, he had a carotid endarterectomy years ago, unfortunately he tells me that he had some complications, and lost trust in doctors. He has not had his blood pressure medicine in several weeks, he knows his blood pressure is elevated, and he does have a mild headache localized in both temples, mild, persistent, no radiation, improving. No visual changes, no slurred speech, numbness or tingling, no lower extremity swelling. He desires to restart all medications.  Hyperlipidemia: Has been off of statins, he is on simvastatin and pravastatin.  Past medical history, Surgical history, Family history not pertinant except as noted below, Social history, Allergies, and medications have been entered into the medical record, reviewed, and no changes needed.   Review of Systems: No headache, visual changes, nausea, vomiting, diarrhea, constipation, dizziness, abdominal pain, skin rash, fevers, chills, night sweats, swollen lymph nodes, weight loss, chest pain, body aches, joint swelling, muscle aches, shortness of breath, mood changes, visual or auditory hallucinations.  Objective:    General: Well Developed, well nourished, and in no acute distress.  Neuro: Alert and oriented x3, extra-ocular muscles intact, sensation grossly intact.  HEENT: Normocephalic, atraumatic, pupils equal round reactive to light, neck supple, no masses, no lymphadenopathy, thyroid nonpalpable.  Skin: Warm and dry, no rashes noted.  Cardiac: Regular rate and rhythm, no murmurs rubs or gallops.  Respiratory: Clear to auscultation bilaterally. Not using accessory muscles, speaking in full sentences.  Abdominal: Soft, nontender, nondistended, positive bowel sounds, no masses, no organomegaly.  Musculoskeletal: Shoulder, elbow, wrist, hip, knee, ankle stable, and with full range of motion Impression and Recommendations:    The  patient was counselled, risk factors were discussed, anticipatory guidance given.

## 2013-01-05 NOTE — Assessment & Plan Note (Signed)
Refilling simvastatin and pravastatin, it seems he's using both of these together.

## 2013-01-05 NOTE — Assessment & Plan Note (Signed)
Extremely elevated, no current signs of end organ damage. Refilling blood pressure medications, he will call me in one week to let me know how the blood pressure is doing, and see me at my next available slot for a Medicare physical.

## 2013-01-15 ENCOUNTER — Encounter: Payer: Self-pay | Admitting: Sports Medicine

## 2013-01-15 ENCOUNTER — Ambulatory Visit (INDEPENDENT_AMBULATORY_CARE_PROVIDER_SITE_OTHER): Payer: Medicare Other

## 2013-01-15 ENCOUNTER — Ambulatory Visit (INDEPENDENT_AMBULATORY_CARE_PROVIDER_SITE_OTHER): Payer: Medicare Other | Admitting: Sports Medicine

## 2013-01-15 VITALS — BP 119/72 | HR 76 | Wt 202.0 lb

## 2013-01-15 DIAGNOSIS — I10 Essential (primary) hypertension: Secondary | ICD-10-CM

## 2013-01-15 DIAGNOSIS — Z299 Encounter for prophylactic measures, unspecified: Secondary | ICD-10-CM

## 2013-01-15 DIAGNOSIS — M5136 Other intervertebral disc degeneration, lumbar region: Secondary | ICD-10-CM

## 2013-01-15 DIAGNOSIS — L570 Actinic keratosis: Secondary | ICD-10-CM

## 2013-01-15 DIAGNOSIS — E785 Hyperlipidemia, unspecified: Secondary | ICD-10-CM

## 2013-01-15 DIAGNOSIS — M51379 Other intervertebral disc degeneration, lumbosacral region without mention of lumbar back pain or lower extremity pain: Secondary | ICD-10-CM

## 2013-01-15 DIAGNOSIS — R209 Unspecified disturbances of skin sensation: Secondary | ICD-10-CM

## 2013-01-15 DIAGNOSIS — L538 Other specified erythematous conditions: Secondary | ICD-10-CM

## 2013-01-15 DIAGNOSIS — K625 Hemorrhage of anus and rectum: Secondary | ICD-10-CM

## 2013-01-15 DIAGNOSIS — M51369 Other intervertebral disc degeneration, lumbar region without mention of lumbar back pain or lower extremity pain: Secondary | ICD-10-CM

## 2013-01-15 DIAGNOSIS — M545 Low back pain: Secondary | ICD-10-CM

## 2013-01-15 DIAGNOSIS — F17200 Nicotine dependence, unspecified, uncomplicated: Secondary | ICD-10-CM

## 2013-01-15 DIAGNOSIS — R42 Dizziness and giddiness: Secondary | ICD-10-CM

## 2013-01-15 DIAGNOSIS — M5137 Other intervertebral disc degeneration, lumbosacral region: Secondary | ICD-10-CM

## 2013-01-15 DIAGNOSIS — Z0000 Encounter for general adult medical examination without abnormal findings: Secondary | ICD-10-CM

## 2013-01-15 DIAGNOSIS — Z136 Encounter for screening for cardiovascular disorders: Secondary | ICD-10-CM

## 2013-01-15 DIAGNOSIS — K649 Unspecified hemorrhoids: Secondary | ICD-10-CM

## 2013-01-15 MED ORDER — MELOXICAM 15 MG PO TABS: ORAL_TABLET | ORAL | Status: DC

## 2013-01-15 MED ORDER — PREDNISONE 50 MG PO TABS: ORAL_TABLET | ORAL | Status: DC

## 2013-01-15 NOTE — Assessment & Plan Note (Signed)
Continue current medications. 

## 2013-01-15 NOTE — Assessment & Plan Note (Signed)
We will start conservatively with physical therapy, prednisone, Mobic. X-rays. Return in one month for this, MRI for interventional injection planning if no better.

## 2013-01-15 NOTE — Assessment & Plan Note (Signed)
Well-controlled on current medications, no changes. 

## 2013-01-15 NOTE — Assessment & Plan Note (Signed)
Medicare wellness exam performed today.

## 2013-01-15 NOTE — Progress Notes (Signed)
Subjective:    [name] is a 72 y.o. male who presents for Medicare Annual/Subsequent preventive examination.  Bilateral leg pain: Elisandro does have some difficulty going up stairs, this predominately stems from low back pain radiating down both legs and causing claudication. It is worse when sitting for long periods of time. Moderate, persistent.  Hypertension: Much better controlled now he is taking his medications.  Hyperlipidemia: Taking simvastatin and pravastatin. Stable.  Preventive Screening-Counseling & Management  Tobacco History  Smoking status  . Former Smoker -- 1.00 packs/day for 50 years  . Types: Cigarettes  . Quit date: 10/14/2010  Smokeless tobacco  . Never Used    Problems Prior to Visit 1.   Current Problems (verified) Patient Active Problem List   Diagnosis Date Noted  . Preventive measure 01/05/2013  . Occlusion and stenosis of carotid artery without mention of cerebral infarction 11/12/2011  . Carotid stenosis 04/16/2011  . Chest pain 09/02/2010  . Pre-operative cardiovascular examination 09/02/2010  . LEG PAIN, BILATERAL 08/03/2010  . DIZZINESS 08/03/2010  . SCIATICA 07/10/2010  . HEMORRHOIDS 03/11/2009  . INTERTRIGO 03/11/2009  . HYPERLIPIDEMIA 01/29/2009  . SOLAR KERATOSIS 01/29/2009  . CIGARETTE SMOKER 10/04/2008  . ESSENTIAL HYPERTENSION, BENIGN 10/04/2008  . RECTAL BLEEDING 10/04/2008    Medications Prior to Visit Current Outpatient Prescriptions on File Prior to Visit  Medication Sig Dispense Refill  . Acetaminophen (TYLENOL ARTHRITIS PAIN PO) Take by mouth as needed.        Marland Kitchen amLODipine (NORVASC) 5 MG tablet TAKE 1 TABLET BY MOUTH EVERY DAY  90 tablet  0  . aspirin (ASPIR-81) 81 MG EC tablet Take 1 tablet (81 mg total) by mouth daily. Swallow whole.  90 tablet  3  . benazepril (LOTENSIN) 20 MG tablet TAKE 1 TABLET BY MOUTH EVERY DAY  90 tablet  0  . hydrochlorothiazide (HYDRODIURIL) 25 MG tablet Take 1 tablet (25 mg total) by mouth  daily.  90 tablet  0  . Lidocaine-Hydrocortisone Ace (ANAMANTLE HC RE) Anamantle HC Gel - apply to hemorrhoids 1-2 x a day as needed.       . pravastatin (PRAVACHOL) 80 MG tablet TAKE 1 TABLET BY MOUTH EVERY NIGHT AT BEDTIME  90 tablet  0  . simvastatin (ZOCOR) 20 MG tablet Take 1 tablet (20 mg total) by mouth at bedtime.  90 tablet  0   No current facility-administered medications on file prior to visit.    Current Medications (verified) Current Outpatient Prescriptions  Medication Sig Dispense Refill  . Acetaminophen (TYLENOL ARTHRITIS PAIN PO) Take by mouth as needed.        Marland Kitchen amLODipine (NORVASC) 5 MG tablet TAKE 1 TABLET BY MOUTH EVERY DAY  90 tablet  0  . aspirin (ASPIR-81) 81 MG EC tablet Take 1 tablet (81 mg total) by mouth daily. Swallow whole.  90 tablet  3  . benazepril (LOTENSIN) 20 MG tablet TAKE 1 TABLET BY MOUTH EVERY DAY  90 tablet  0  . hydrochlorothiazide (HYDRODIURIL) 25 MG tablet Take 1 tablet (25 mg total) by mouth daily.  90 tablet  0  . Lidocaine-Hydrocortisone Ace (ANAMANTLE HC RE) Anamantle HC Gel - apply to hemorrhoids 1-2 x a day as needed.       . pravastatin (PRAVACHOL) 80 MG tablet TAKE 1 TABLET BY MOUTH EVERY NIGHT AT BEDTIME  90 tablet  0  . simvastatin (ZOCOR) 20 MG tablet Take 1 tablet (20 mg total) by mouth at bedtime.  90 tablet  0  No current facility-administered medications for this visit.     Allergies (verified) Pollen extract   PAST HISTORY  Family History Family History  Problem Relation Age of Onset  . Dementia Mother   . Heart attack Father     First MI age 80.  Marland Kitchen Heart disease Father 66  . Heart attack Brother     CAD, prior MI  . Hypertension Brother     Social History History  Substance Use Topics  . Smoking status: Former Smoker -- 1.00 packs/day for 50 years    Types: Cigarettes    Quit date: 10/14/2010  . Smokeless tobacco: Never Used  . Alcohol Use: 0.0 oz/week    1-2 drink(s) per week    Are there smokers in your  home (other than you)?  No  Risk Factors Current exercise habits: Walking  Dietary issues discussed: Yes   Cardiac risk factors: advanced age (older than 63 for men, 83 for women).  Depression Screen (Note: if answer to either of the following is "Yes", a more complete depression screening is indicated)   Q1: Over the past two weeks, have you felt down, depressed or hopeless? No  Q2: Over the past two weeks, have you felt little interest or pleasure in doing things? No  Have you lost interest or pleasure in daily life? No  Do you often feel hopeless? No  Do you cry easily over simple problems? No  Activities of Daily Living In your present state of health, do you have any difficulty performing the following activities?:  Driving? No Managing money?  No Feeding yourself? No Getting from bed to chair? No Climbing a flight of stairs? Yes Preparing food and eating?: No Bathing or showering? No Getting dressed: No Getting to the toilet? No Using the toilet:No Moving around from place to place: No In the past year have you fallen or had a near fall?:No   Are you sexually active?  No  Do you have more than one partner?  No  Hearing Difficulties: No Do you often ask people to speak up or repeat themselves? No Do you experience ringing or noises in your ears? Yes Do you have difficulty understanding soft or whispered voices? No   Do you feel that you have a problem with memory? No  Do you often misplace items? No  Do you feel safe at home?  Yes  Cognitive Testing  Alert? Yes  Normal Appearance?Yes  Oriented to person? Yes  Place? Yes   Time? Yes  Recall of three objects?  Yes  Can perform simple calculations? Yes  Displays appropriate judgment?Yes  Can read the correct time from a watch face?Yes   Advanced Directives have been discussed with the patient? Yes   List the Names of Other Physician/Practitioners you currently use: 1.    Indicate any recent Medical Services  you may have received from other than [facility] providers in the past year (date may be approximate).  Immunization History  Administered Date(s) Administered  . DTP 03/08/2007  . Influenza Whole 03/26/2010  . Pneumococcal Conjugate 01/05/2013  . Tdap 01/05/2013    Screening Tests Health Maintenance  Topic Date Due  . Zostavax  11/10/2001  . Influenza Vaccine  02/05/2013  . Colonoscopy  06/07/2016  . Tetanus/tdap  01/06/2023  . Pneumococcal Polysaccharide Vaccine Age 65 And Over  Completed    All answers were reviewed with the patient and necessary referrals were made:  Rodney Langton, MD   01/15/2013   History  reviewed: allergies, current medications, past family history, past medical history, past social history, past surgical history and problem list  Review of Systems Pertinent items are noted in HPI.    Objective:     Vision by Snellen chart: right eye:20/20, left eye:20/200 Blood pressure 119/72, pulse 76, weight 202 lb (91.627 kg). Body mass index is 28.19 kg/(m^2).  General Appearance: Alert, well developed and well nourished, cooperative, no distress.  Head: Normocephalic, no obvious abnormality  Eyes: PERRL, EOM's intact, conjunctiva and corneas clear.  Nose: Nares symmetrical, septum midline, mucosa pink, clear watery discharge; no sinus tenderness  Throat: Lips, tongue, and mucosa are moist, pink, and intact; teeth intact  Neck: Supple, symmetrical, trachea midline, no adenopathy; thyroid: no enlargement, symmetric,no tenderness/mass/nodules; no carotid bruit, no JVD  Back: Symmetrical, no curvature, ROM normal, no CVA tenderness  Chest/Breast: No mass or tenderness  Lungs: Clear to auscultation bilaterally, respirations unlabored  Heart: Normal PMI, no lower extremity edema, regular rate & rhythm, S1 and S2 normal, no murmurs, rubs, or gallops. Abdomen: Soft, non-tender, bowel sounds active all four quadrants, no mass, or organomegaly  Musculoskeletal: All  joints, extremities examined, non-tender and unremarkable.  Lymphatic: No adenopathy  Skin/Hair/Nails: Skin warm, dry, and intact, no rashes or abnormal dyspigmentation  Neurologic: Alert and oriented x3, no cranial nerve deficits, normal strength and tone, gait steady   12-lead EKG was reviewed, rate is 76, no PR interval abnormalities, there is some delayed repolarization as well as right bundle branch block, as well as what appears to be some left ventricular hypertrophy.     Assessment:     Healthy male     Plan:     During the course of the visit the patient was educated and counseled about appropriate screening and preventive services including:    Nothing else.  Diet review for nutrition referral? Yes ____  Not Indicated __x__   Patient Instructions (the written plan) was given to the patient.  Medicare Attestation I have personally reviewed: The patient's medical and social history Their use of alcohol, tobacco or illicit drugs Their current medications and supplements The patient's functional ability including ADLs,fall risks, home safety risks, cognitive, and hearing and visual impairment Diet and physical activities Evidence for depression or mood disorders  The patient's weight, height, BMI, and visual acuity have been recorded in the chart.  I have made referrals, counseling, and provided education to the patient based on review of the above and I have provided the patient with a written personalized care plan for preventive services.     Rodney Langton, MD   01/15/2013

## 2013-01-23 ENCOUNTER — Encounter: Payer: Self-pay | Admitting: *Deleted

## 2013-02-15 ENCOUNTER — Encounter: Payer: Self-pay | Admitting: Sports Medicine

## 2013-02-15 ENCOUNTER — Ambulatory Visit (INDEPENDENT_AMBULATORY_CARE_PROVIDER_SITE_OTHER): Payer: Medicare Other | Admitting: Sports Medicine

## 2013-02-15 VITALS — BP 122/79 | HR 83 | Wt 201.0 lb

## 2013-02-15 DIAGNOSIS — M51379 Other intervertebral disc degeneration, lumbosacral region without mention of lumbar back pain or lower extremity pain: Secondary | ICD-10-CM

## 2013-02-15 DIAGNOSIS — M51369 Other intervertebral disc degeneration, lumbar region without mention of lumbar back pain or lower extremity pain: Secondary | ICD-10-CM

## 2013-02-15 DIAGNOSIS — M5137 Other intervertebral disc degeneration, lumbosacral region: Secondary | ICD-10-CM

## 2013-02-15 DIAGNOSIS — M5136 Other intervertebral disc degeneration, lumbar region: Secondary | ICD-10-CM

## 2013-02-15 MED ORDER — TRAMADOL HCL 50 MG PO TABS: ORAL_TABLET | ORAL | Status: DC

## 2013-02-15 NOTE — Progress Notes (Signed)
  Subjective:    CC: Followup  HPI: Lumbar radiculitis: [name] has degenerative disc disease at L1-L2 as well as L5-S1. He has been through conservative measures, unfortunately he never got a call from physical therapy and has not done this. He is feeling better with the medications, but does desire to pursue physical therapy before pursuing any interventional treatments. Symptoms are moderate, persistent, continue to radiate down both legs and L5 versus an S1 distribution.  Past medical history, Surgical history, Family history not pertinant except as noted below, Social history, Allergies, and medications have been entered into the medical record, reviewed, and no changes needed.   Review of Systems: No fevers, chills, night sweats, weight loss, chest pain, or shortness of breath.   Objective:    General: Well Developed, well nourished, and in no acute distress.  Neuro: Alert and oriented x3, extra-ocular muscles intact, sensation grossly intact.  HEENT: Normocephalic, atraumatic, pupils equal round reactive to light, neck supple, no masses, no lymphadenopathy, thyroid nonpalpable.  Skin: Warm and dry, no rashes. Cardiac: Regular rate and rhythm, no murmurs rubs or gallops, no lower extremity edema.  Respiratory: Clear to auscultation bilaterally. Not using accessory muscles, speaking in full sentences.  Impression and Recommendations:

## 2013-02-15 NOTE — Assessment & Plan Note (Signed)
Degenerative changes at the L1-L2 level as well as L5-S1 level. Symptoms seem to predominately represent a bilateral S1 radiculitis. He did not yet have physical therapy, I'm going to reorder this. I do think eventually we will proceed to interventional treatment so I will also obtain an MRI and like to see him back to go over results. Tramadol as needed for pain in the meantime.

## 2013-02-16 ENCOUNTER — Telehealth: Payer: Self-pay | Admitting: *Deleted

## 2013-02-16 NOTE — Telephone Encounter (Signed)
PA obtained for MRI lumbar w/o contrast. Auth # is [id number]. Good thru 04/02/13. Linda at [facility] notified.

## 2013-02-19 ENCOUNTER — Ambulatory Visit (INDEPENDENT_AMBULATORY_CARE_PROVIDER_SITE_OTHER): Payer: Medicare Other | Admitting: Physical Therapy

## 2013-02-19 DIAGNOSIS — M2560 Stiffness of unspecified joint, not elsewhere classified: Secondary | ICD-10-CM

## 2013-02-19 DIAGNOSIS — M545 Low back pain: Secondary | ICD-10-CM

## 2013-02-19 DIAGNOSIS — M5137 Other intervertebral disc degeneration, lumbosacral region: Secondary | ICD-10-CM

## 2013-02-20 ENCOUNTER — Ambulatory Visit (HOSPITAL_BASED_OUTPATIENT_CLINIC_OR_DEPARTMENT_OTHER)
Admission: RE | Admit: 2013-02-20 | Discharge: 2013-02-20 | Disposition: A | Discharge status: Home / Self Care | Payer: Medicare Other | Source: Ambulatory Visit | Attending: Sports Medicine | Admitting: Sports Medicine

## 2013-02-20 DIAGNOSIS — M5137 Other intervertebral disc degeneration, lumbosacral region: Secondary | ICD-10-CM | POA: Insufficient documentation

## 2013-02-20 DIAGNOSIS — M5136 Other intervertebral disc degeneration, lumbar region: Secondary | ICD-10-CM

## 2013-02-20 DIAGNOSIS — M47817 Spondylosis without myelopathy or radiculopathy, lumbosacral region: Secondary | ICD-10-CM | POA: Insufficient documentation

## 2013-02-20 DIAGNOSIS — M51379 Other intervertebral disc degeneration, lumbosacral region without mention of lumbar back pain or lower extremity pain: Secondary | ICD-10-CM | POA: Insufficient documentation

## 2013-02-28 ENCOUNTER — Encounter (INDEPENDENT_AMBULATORY_CARE_PROVIDER_SITE_OTHER): Payer: Medicare Other | Admitting: Physical Therapy

## 2013-02-28 DIAGNOSIS — M545 Low back pain: Secondary | ICD-10-CM

## 2013-02-28 DIAGNOSIS — M5137 Other intervertebral disc degeneration, lumbosacral region: Secondary | ICD-10-CM

## 2013-02-28 DIAGNOSIS — M2560 Stiffness of unspecified joint, not elsewhere classified: Secondary | ICD-10-CM

## 2013-03-07 ENCOUNTER — Encounter: Payer: Medicare Other | Admitting: Physical Therapy

## 2013-06-08 ENCOUNTER — Other Ambulatory Visit: Payer: Self-pay | Admitting: Sports Medicine

## 2014-12-19 ENCOUNTER — Encounter: Payer: Self-pay | Admitting: Family Medicine

## 2014-12-19 ENCOUNTER — Ambulatory Visit (INDEPENDENT_AMBULATORY_CARE_PROVIDER_SITE_OTHER): Payer: Medicare Other | Admitting: Family Medicine

## 2014-12-19 VITALS — BP 152/78 | HR 73 | Wt 214.0 lb

## 2014-12-19 DIAGNOSIS — R609 Edema, unspecified: Secondary | ICD-10-CM

## 2014-12-19 MED ORDER — FUROSEMIDE 20 MG PO TABS: 20.0000 mg | ORAL_TABLET | Freq: Every day | ORAL | Status: DC

## 2014-12-19 NOTE — Assessment & Plan Note (Signed)
Bilateral lower extremity edema. I suspect patient has heart failure.  Check basic metabolic panel and BNP.  Order echocardiogram without bubbles. Start Lasix.  Return in one to 2 weeks.

## 2014-12-19 NOTE — Addendum Note (Signed)
Addended by: Minna AntisBRIGHAM, Taytum Wheller T on: 12/19/2014 02:04 PM   Modules accepted: Orders

## 2014-12-19 NOTE — Patient Instructions (Signed)
Thank you for coming in today. We are doing some labs today. We will need to get an ultrasound of your heart. Return in one to 2 weeks for repeat labs and follow-up with legs Start Lasix daily in the morning. Go to the emergency room if you get worse.  Call or go to the emergency room if you get worse, have trouble breathing, have chest pains, or palpitations.   Edema Edema is an abnormal buildup of fluids in your bodytissues. Edema is somewhatdependent on gravity to pull the fluid to the lowest place in your body. That makes the condition more common in the legs and thighs (lower extremities). Painless swelling of the feet and ankles is common and becomes more likely as you get older. It is also common in looser tissues, like around your eyes.  When the affected area is squeezed, the fluid may move out of that spot and leave a dent for a few moments. This dent is called pitting.  CAUSES  There are many possible causes of edema. Eating too much salt and being on your feet or sitting for a long time can cause edema in your legs and ankles. Hot weather may make edema worse. Common medical causes of edema include:  Heart failure.  Liver disease.  Kidney disease.  Weak blood vessels in your legs.  Cancer.  An injury.  Pregnancy.  Some medications.  Obesity. SYMPTOMS  Edema is usually painless.Your skin may look swollen or shiny.  DIAGNOSIS  Your health care provider may be able to diagnose edema by asking about your medical history and doing a physical exam. You may need to have tests such as X-rays, an electrocardiogram, or blood tests to check for medical conditions that may cause edema.  TREATMENT  Edema treatment depends on the cause. If you have heart, liver, or kidney disease, you need the treatment appropriate for these conditions. General treatment may include:  Elevation of the affected body part above the level of your heart.  Compression of the affected body part.  Pressure from elastic bandages or support stockings squeezes the tissues and forces fluid back into the blood vessels. This keeps fluid from entering the tissues.  Restriction of fluid and salt intake.  Use of a water pill (diuretic). These medications are appropriate only for some types of edema. They pull fluid out of your body and make you urinate more often. This gets rid of fluid and reduces swelling, but diuretics can have side effects. Only use diuretics as directed by your health care provider. HOME CARE INSTRUCTIONS   Keep the affected body part above the level of your heart when you are lying down.   Do not sit still or stand for prolonged periods.   Do not put anything directly under your knees when lying down.  Do not wear constricting clothing or garters on your upper legs.   Exercise your legs to work the fluid back into your blood vessels. This may help the swelling go down.   Wear elastic bandages or support stockings to reduce ankle swelling as directed by your health care provider.   Eat a low-salt diet to reduce fluid if your health care provider recommends it.   Only take medicines as directed by your health care provider. SEEK MEDICAL CARE IF:   Your edema is not responding to treatment.  You have heart, liver, or kidney disease and notice symptoms of edema.  You have edema in your legs that does not improve after elevating  them.   You have sudden and unexplained weight gain. SEEK IMMEDIATE MEDICAL CARE IF:   You develop shortness of breath or chest pain.   You cannot breathe when you lie down.  You develop pain, redness, or warmth in the swollen areas.   You have heart, liver, or kidney disease and suddenly get edema.  You have a fever and your symptoms suddenly get worse. MAKE SURE YOU:   Understand these instructions.  Will watch your condition.  Will get help right away if you are not doing well or get worse. Document Released:  05/24/2005 Document Revised: 10/08/2013 Document Reviewed: 03/16/2013 [facility] Patient Information 2015 [place], [place]. This information is not intended to replace advice given to you by your health care provider. Make sure you discuss any questions you have with your health care provider.

## 2014-12-19 NOTE — Progress Notes (Signed)
[name] is a 74 y.o. male who [facility]  today for lower extremity edema. Patient has a two-week history of bilateral lower extremity edema. No chest pains palpitations or shortness of breath. He denies any significant orthopnea. He notes that he had been not taking his blood pressure medicines recently. He restarted his amlodipine, hydrochlorothiazide, and ACE inhibitor when he developed the edema. That has not helped much. He feels well otherwise.   Past Medical History  Diagnosis Date  . Hypertension     2010  . Hyperlipidemia   . Degenerative arthritis of spine   . Hemorrhoids   . Solar keratosis   . Carotid artery occlusion   . Sciatica   . Colon polyps   . Diverticulosis   . Hemorrhoids    Past Surgical History  Procedure Laterality Date  . Neck mass excision    . Carotid endarterectomy  09/21/10    Right CEA   History  Substance Use Topics  . Smoking status: Former Smoker -- 1.00 packs/day for 50 years    Types: Cigarettes    Quit date: 10/14/2010  . Smokeless tobacco: Never Used  . Alcohol Use: 0.0 oz/week    1-2 drink(s) per week   ROS as above Medications: Current Outpatient Prescriptions  Medication Sig Dispense Refill  . Acetaminophen (TYLENOL ARTHRITIS PAIN PO) Take by mouth as needed.      Marland Kitchen. amLODipine (NORVASC) 5 MG tablet TAKE 1 TABLET BY MOUTH EVERY DAY 90 tablet 3  . aspirin (ASPIR-81) 81 MG EC tablet Take 1 tablet (81 mg total) by mouth daily. Swallow whole. 90 tablet 3  . benazepril (LOTENSIN) 20 MG tablet TAKE 1 TABLET BY MOUTH EVERY DAY 90 tablet 3  . hydrochlorothiazide (HYDRODIURIL) 25 MG tablet TAKE 1 TABLET BY MOUTH DAILY 90 tablet 3  . Lidocaine-Hydrocortisone Ace (ANAMANTLE HC RE) Anamantle HC Gel - apply to hemorrhoids 1-2 x a day as needed.     . meloxicam (MOBIC) 15 MG tablet One tab PO qAM with breakfast for 2 weeks, then daily prn pain. 30 tablet 3  . pravastatin (PRAVACHOL) 80 MG tablet  TAKE 1 TABLET BY MOUTH EVERY NIGHT AT BEDTIME 90 tablet 0  . predniSONE (DELTASONE) 50 MG tablet One tab PO daily for 5 days. 5 tablet 0  . simvastatin (ZOCOR) 20 MG tablet Take 1 tablet (20 mg total) by mouth at bedtime. 90 tablet 0  . traMADol (ULTRAM) 50 MG tablet 1-2 tabs by mouth Q8 hours, maximum 6 tabs per day. 90 tablet 0  . furosemide (LASIX) 20 MG tablet Take 1 tablet (20 mg total) by mouth daily. 30 tablet 0   No current facility-administered medications for this visit.   Allergies  Allergen Reactions  . Pollen Extract      Exam:  BP 152/78 mmHg  Pulse 73  Wt 214 lb (97.07 kg) Gen: Well NAD HEENT: EOMI,  MMM Lungs: Normal work of breathing. CTABL Heart: RRR no MRG Abd: NABS, Soft. Nondistended, Nontender Exts: Brisk capillary refill, warm and well perfused. Pulses intact. 2+ pitting edema bilateral lower extremities to the knees. Symmetrical appearance.  12-lead EKG:  Normal sinus rhythm with PAC and a rate of 63 bpm. QRS morphology suggestive of right bundle branch block. Normal-appearing T waves. QTC 470. Unchanged from prior EKG dated 01/15/2013.  No results found for this or any previous visit (from the past 24 hour(s)). No results found.   Please see individual assessment and  plan sections. This visit required moderate complexity and decision making.

## 2014-12-20 LAB — BASIC METABOLIC PANEL
BUN: 12 mg/dL (ref 6–23)
CHLORIDE: 102 meq/L (ref 96–112)
CO2: 24 meq/L (ref 19–32)
Calcium: 8.9 mg/dL (ref 8.4–10.5)
Creat: 1.37 mg/dL — ABNORMAL HIGH (ref 0.50–1.35)
Glucose, Bld: 86 mg/dL (ref 70–99)
Potassium: 4.9 mEq/L (ref 3.5–5.3)
SODIUM: 137 meq/L (ref 135–145)

## 2014-12-20 LAB — PRO B NATRIURETIC PEPTIDE: Pro B Natriuretic peptide (BNP): 2396 pg/mL — ABNORMAL HIGH (ref <126)

## 2014-12-26 ENCOUNTER — Encounter: Payer: Self-pay | Admitting: Family Medicine

## 2014-12-26 ENCOUNTER — Ambulatory Visit (INDEPENDENT_AMBULATORY_CARE_PROVIDER_SITE_OTHER): Payer: Medicare Other | Admitting: Family Medicine

## 2014-12-26 VITALS — BP 120/68 | HR 74 | Wt 208.0 lb

## 2014-12-26 DIAGNOSIS — R42 Dizziness and giddiness: Secondary | ICD-10-CM

## 2014-12-26 DIAGNOSIS — R609 Edema, unspecified: Secondary | ICD-10-CM

## 2014-12-26 NOTE — Patient Instructions (Signed)
Thank you for coming in today. STOP the hydrochlorothiazide and amlodipine Continue Lasix (furosemide)  Come back Monday for lab recheck You should hear from ultrasound tomorrow for heart ultrasound Call or go to the emergency room if you get worse, have trouble breathing, have chest pains, or palpitations.   Peripheral Edema You have swelling in your legs (peripheral edema). This swelling is due to excess accumulation of salt and water in your body. Edema may be a sign of heart, kidney or liver disease, or a side effect of a medication. It may also be due to problems in the leg veins. Elevating your legs and using special support stockings may be very helpful, if the cause of the swelling is due to poor venous circulation. Avoid long periods of standing, whatever the cause. Treatment of edema depends on identifying the cause. Chips, pretzels, pickles and other salty foods should be avoided. Restricting salt in your diet is almost always needed. Water pills (diuretics) are often used to remove the excess salt and water from your body via urine. These medicines prevent the kidney from reabsorbing sodium. This increases urine flow. Diuretic treatment may also result in lowering of potassium levels in your body. Potassium supplements may be needed if you have to use diuretics daily. Daily weights can help you keep track of your progress in clearing your edema. You should call your caregiver for follow up care as recommended. SEEK IMMEDIATE MEDICAL CARE IF:   You have increased swelling, pain, redness, or heat in your legs.  You develop shortness of breath, especially when lying down.  You develop chest or abdominal pain, weakness, or fainting.  You have a fever. Document Released: 07/01/2004 Document Revised: 08/16/2011 Document Reviewed: 06/11/2009 [facility] Patient Information 2015 [place], [place]. This information is not intended to replace advice given to you by your health care provider. Make  sure you discuss any questions you have with your health care provider.

## 2014-12-26 NOTE — Assessment & Plan Note (Addendum)
Likely related to heart failure. Patient has elevated proBNP. Echo pending. Clarify the order. It should be performed soon. Discontinue hydrochlorothiazide and amlodipine as I believe this is causing mild hypotension. Check BMP today and return on Monday. Continue Lasix

## 2014-12-26 NOTE — Progress Notes (Signed)
[name] is a 74 y.o. male who presents to [facility]  today for follow-up leg edema. Patient was seen last week and diagnosed with peripheral edema. He was thought possibly due to heart failure. An echocardiogram was ordered but has not been done yet. He denies any chest pain or shortness of breath. He does note lightheadedness starting especially with standing since starting Lasix. He notes that he is urinating frequently while taking the medication. He notes the lower extremity swelling has improved slightly but is still persistent.   Past Medical History  Diagnosis Date  . Hypertension     2010  . Hyperlipidemia   . Degenerative arthritis of spine   . Hemorrhoids   . Solar keratosis   . Carotid artery occlusion   . Sciatica   . Colon polyps   . Diverticulosis   . Hemorrhoids    Past Surgical History  Procedure Laterality Date  . Neck mass excision    . Carotid endarterectomy  09/21/10    Right CEA   History  Substance Use Topics  . Smoking status: Former Smoker -- 1.00 packs/day for 50 years    Types: Cigarettes    Quit date: 10/14/2010  . Smokeless tobacco: Never Used  . Alcohol Use: 0.0 oz/week    1-2 drink(s) per week   ROS as above Medications: Current Outpatient Prescriptions  Medication Sig Dispense Refill  . Acetaminophen (TYLENOL ARTHRITIS PAIN PO) Take by mouth as needed.      Marland Kitchen amLODipine (NORVASC) 5 MG tablet TAKE 1 TABLET BY MOUTH EVERY DAY 90 tablet 3  . aspirin (ASPIR-81) 81 MG EC tablet Take 1 tablet (81 mg total) by mouth daily. Swallow whole. 90 tablet 3  . benazepril (LOTENSIN) 20 MG tablet TAKE 1 TABLET BY MOUTH EVERY DAY 90 tablet 3  . furosemide (LASIX) 20 MG tablet Take 1 tablet (20 mg total) by mouth daily. 30 tablet 0  . hydrochlorothiazide (HYDRODIURIL) 25 MG tablet TAKE 1 TABLET BY MOUTH DAILY 90 tablet 3  . Lidocaine-Hydrocortisone Ace (ANAMANTLE HC RE) Anamantle HC Gel - apply to hemorrhoids 1-2 x a day as  needed.     . meloxicam (MOBIC) 15 MG tablet One tab PO qAM with breakfast for 2 weeks, then daily prn pain. 30 tablet 3  . pravastatin (PRAVACHOL) 80 MG tablet TAKE 1 TABLET BY MOUTH EVERY NIGHT AT BEDTIME 90 tablet 0  . predniSONE (DELTASONE) 50 MG tablet One tab PO daily for 5 days. 5 tablet 0  . simvastatin (ZOCOR) 20 MG tablet Take 1 tablet (20 mg total) by mouth at bedtime. 90 tablet 0  . traMADol (ULTRAM) 50 MG tablet 1-2 tabs by mouth Q8 hours, maximum 6 tabs per day. 90 tablet 0   No current facility-administered medications for this visit.   Allergies  Allergen Reactions  . Pollen Extract      Exam:  BP 120/68 mmHg  Pulse 74  Wt 208 lb (94.348 kg)  Orthostatic VS for the past 24 hrs:  BP- Lying Pulse- Lying BP- Sitting Pulse- Sitting BP- Standing at 0 minutes Pulse- Standing at 0 minutes  12/26/14 1552 121/63 mmHg 73 120/68 mmHg 74 103/60 mmHg 72      Gen: Well NAD HEENT: EOMI,  MMM Lungs: Normal work of breathing. CTABL Heart: RRR no MRG Abd: NABS, Soft. Nondistended, Nontender Exts: Brisk capillary refill, warm and well perfused. 1+ bilateral lower extremity edema.  No results found for this or any previous  visit (from the past 24 hour(s)). No results found.   Please see individual assessment and plan sections.

## 2014-12-26 NOTE — Assessment & Plan Note (Signed)
Patient is a new problem of mild lightheadedness and dizziness. This is likely orthostasis due to his mild hypotension. This is likely due to decreasing preload due to Lasix while continuing to take 3 and I hypertensive. Discontinue hydrochlorothiazide and amlodipine.

## 2014-12-27 LAB — BASIC METABOLIC PANEL
BUN: 19 mg/dL (ref 6–23)
CALCIUM: 8.9 mg/dL (ref 8.4–10.5)
CHLORIDE: 101 meq/L (ref 96–112)
CO2: 21 mEq/L (ref 19–32)
Creat: 1.66 mg/dL — ABNORMAL HIGH (ref 0.50–1.35)
GLUCOSE: 86 mg/dL (ref 70–99)
Potassium: 4.5 mEq/L (ref 3.5–5.3)
SODIUM: 139 meq/L (ref 135–145)

## 2014-12-27 NOTE — Progress Notes (Signed)
Quick Note:  Not sig changed, no changes. ______

## 2014-12-30 ENCOUNTER — Ambulatory Visit (INDEPENDENT_AMBULATORY_CARE_PROVIDER_SITE_OTHER): Payer: Medicare Other | Admitting: Family Medicine

## 2014-12-30 ENCOUNTER — Encounter: Payer: Self-pay | Admitting: Family Medicine

## 2014-12-30 VITALS — BP 140/80 | HR 75 | Wt 208.0 lb

## 2014-12-30 DIAGNOSIS — R609 Edema, unspecified: Secondary | ICD-10-CM | POA: Diagnosis not present

## 2014-12-30 MED ORDER — FUROSEMIDE 20 MG PO TABS: 20.0000 mg | ORAL_TABLET | Freq: Two times a day (BID) | ORAL | Status: DC

## 2014-12-30 MED ORDER — BENAZEPRIL HCL 20 MG PO TABS: 20.0000 mg | ORAL_TABLET | Freq: Every day | ORAL | Status: DC

## 2014-12-30 NOTE — Progress Notes (Signed)
[name] is a 74 y.o. male who presents to [facility]  today for follow-up dizziness. Patient was seen last week with continued leg swelling. He felt quite dizzy while on Lasix as well as hydrochlorothiazide and amlodipine. He feels much less dizzy than he did last week however notes continued leg swelling. He takes his Lasix one time daily. No chest pains palpitations or shortness of breath.   Past Medical History  Diagnosis Date  . Hypertension     2010  . Hyperlipidemia   . Degenerative arthritis of spine   . Hemorrhoids   . Solar keratosis   . Carotid artery occlusion   . Sciatica   . Colon polyps   . Diverticulosis   . Hemorrhoids    Past Surgical History  Procedure Laterality Date  . Neck mass excision    . Carotid endarterectomy  09/21/10    Right CEA   History  Substance Use Topics  . Smoking status: Former Smoker -- 1.00 packs/day for 50 years    Types: Cigarettes    Quit date: 10/14/2010  . Smokeless tobacco: Never Used  . Alcohol Use: 0.0 oz/week    1-2 drink(s) per week   ROS as above Medications: Current Outpatient Prescriptions  Medication Sig Dispense Refill  . Acetaminophen (TYLENOL ARTHRITIS PAIN PO) Take by mouth as needed.      Marland Kitchen aspirin (ASPIR-81) 81 MG EC tablet Take 1 tablet (81 mg total) by mouth daily. Swallow whole. 90 tablet 3  . benazepril (LOTENSIN) 20 MG tablet Take 1 tablet (20 mg total) by mouth daily. 90 tablet 3  . furosemide (LASIX) 20 MG tablet Take 1 tablet (20 mg total) by mouth 2 (two) times daily. 60 tablet 2  . Lidocaine-Hydrocortisone Ace (ANAMANTLE HC RE) Anamantle HC Gel - apply to hemorrhoids 1-2 x a day as needed.     . meloxicam (MOBIC) 15 MG tablet One tab PO qAM with breakfast for 2 weeks, then daily prn pain. 30 tablet 3  . pravastatin (PRAVACHOL) 80 MG tablet TAKE 1 TABLET BY MOUTH EVERY NIGHT AT BEDTIME 90 tablet 0  . simvastatin (ZOCOR) 20 MG tablet Take 1 tablet (20 mg total) by mouth  at bedtime. 90 tablet 0  . traMADol (ULTRAM) 50 MG tablet 1-2 tabs by mouth Q8 hours, maximum 6 tabs per day. 90 tablet 0   No current facility-administered medications for this visit.   Allergies  Allergen Reactions  . Pollen Extract      Exam:  BP 140/80 mmHg  Pulse 75  Wt 208 lb (94.348 kg) Gen: Well NAD HEENT: EOMI,  MMM Lungs: Normal work of breathing. CTABL Heart: RRR no MRG Abd: NABS, Soft. Nondistended, Nontender Exts: Brisk capillary refill, warm and well perfused. 1+ pitting edema bilateral lower extremities. Symmetrical appearing  No results found for this or any previous visit (from the past 24 hour(s)). No results found.   Please see individual assessment and plan sections.

## 2014-12-30 NOTE — Assessment & Plan Note (Signed)
The dizziness resolved. Increase Lasix to twice daily. Ultrasound scheduled for Wednesday. Return in 2 weeks BMP today

## 2014-12-30 NOTE — Patient Instructions (Addendum)
Thank you for coming in today. Please follow up with ECHO ultrasound of your heart in [place] at 3pm on 01/01/15. Arrive by 2:30pm. Ask at the front desk where to go. 1st floor [address]. Take Lasix twice daily.  Return after you get back from the beach.  Call or go to the emergency room if you get worse, have trouble breathing, have chest pains, or palpitations.   Get labs again today.

## 2014-12-31 LAB — BASIC METABOLIC PANEL
BUN: 18 mg/dL (ref 7–25)
CALCIUM: 9.3 mg/dL (ref 8.6–10.3)
CHLORIDE: 104 mmol/L (ref 98–110)
CO2: 25 mmol/L (ref 20–31)
CREATININE: 1.56 mg/dL — AB (ref 0.70–1.18)
GLUCOSE: 90 mg/dL (ref 65–99)
Potassium: 5.5 mmol/L — ABNORMAL HIGH (ref 3.5–5.3)
SODIUM: 139 mmol/L (ref 135–146)

## 2014-12-31 NOTE — Progress Notes (Signed)
Quick Note:  Potassium is high. An increased dose of Lasix should help. Return on Thursday for a lab only visit to recheck lab. ______

## 2015-01-01 ENCOUNTER — Ambulatory Visit (HOSPITAL_BASED_OUTPATIENT_CLINIC_OR_DEPARTMENT_OTHER)
Admission: RE | Admit: 2015-01-01 | Discharge: 2015-01-01 | Disposition: A | Discharge status: Home / Self Care | Payer: Medicare Other | Source: Ambulatory Visit | Attending: Family Medicine | Admitting: Family Medicine

## 2015-01-01 DIAGNOSIS — I509 Heart failure, unspecified: Secondary | ICD-10-CM | POA: Insufficient documentation

## 2015-01-01 DIAGNOSIS — I10 Essential (primary) hypertension: Secondary | ICD-10-CM | POA: Diagnosis not present

## 2015-01-01 DIAGNOSIS — F17200 Nicotine dependence, unspecified, uncomplicated: Secondary | ICD-10-CM | POA: Diagnosis not present

## 2015-01-01 DIAGNOSIS — R609 Edema, unspecified: Secondary | ICD-10-CM

## 2015-01-01 DIAGNOSIS — E785 Hyperlipidemia, unspecified: Secondary | ICD-10-CM | POA: Insufficient documentation

## 2015-01-01 NOTE — Progress Notes (Signed)
  Echocardiogram 2D Echocardiogram has been performed.  Leta Jungling M 01/01/2015, 3:41 PM

## 2015-01-02 ENCOUNTER — Telehealth: Payer: Self-pay | Admitting: Family Medicine

## 2015-01-02 DIAGNOSIS — I5032 Chronic diastolic (congestive) heart failure: Secondary | ICD-10-CM

## 2015-01-02 NOTE — Telephone Encounter (Signed)
Pt stopped by the office and picked up a copy of results.

## 2015-01-02 NOTE — Addendum Note (Signed)
Addended by: Rodolph Bong on: 01/02/2015 02:27 PM   Modules accepted: Orders

## 2015-01-02 NOTE — Telephone Encounter (Signed)
Patient has diastolic heart failure. This is what we were expecting to find and better then he could have been. Continue medicines. Get those labs rechecked before he goes on vacation. CMA will notify pt.

## 2015-01-02 NOTE — Telephone Encounter (Signed)
Called and left vm requesting a call back for results and recommendations.

## 2015-01-03 ENCOUNTER — Telehealth: Payer: Self-pay | Admitting: Family Medicine

## 2015-01-03 ENCOUNTER — Telehealth: Payer: Self-pay

## 2015-01-03 LAB — BASIC METABOLIC PANEL
BUN: 22 mg/dL (ref 7–25)
CALCIUM: 9.4 mg/dL (ref 8.6–10.3)
CO2: 25 mEq/L (ref 20–31)
Chloride: 100 mEq/L (ref 98–110)
Creat: 1.82 mg/dL — ABNORMAL HIGH (ref 0.70–1.18)
Glucose, Bld: 89 mg/dL (ref 65–99)
Potassium: 5 mEq/L (ref 3.5–5.3)
SODIUM: 137 meq/L (ref 135–146)

## 2015-01-03 NOTE — Telephone Encounter (Signed)
Pt daughter called to see if the pt could have blood work done at location near where they will be vacationing and have the results sent to [place] on Monday 01/06/2015 . I advised that this is ok per Dr. Denyse Amass.

## 2015-01-03 NOTE — Telephone Encounter (Signed)
pts daughter notified of results and recommendations. I advised that the pt should not go out of town this weekend per Dr. Denyse Amass. Pt will schedule an appointment for Monday.

## 2015-01-03 NOTE — Telephone Encounter (Signed)
Creatinine is elevated. Called patient and left a message to decrease benazepril to one half tablet daily and just take Lasix one daily. Return on Monday for recheck

## 2015-01-06 ENCOUNTER — Other Ambulatory Visit: Payer: Self-pay | Admitting: Sports Medicine

## 2015-01-06 ENCOUNTER — Telehealth: Payer: Self-pay

## 2015-01-06 ENCOUNTER — Ambulatory Visit: Payer: Medicare Other | Admitting: Family Medicine

## 2015-01-06 DIAGNOSIS — E785 Hyperlipidemia, unspecified: Secondary | ICD-10-CM

## 2015-01-06 DIAGNOSIS — I10 Essential (primary) hypertension: Secondary | ICD-10-CM

## 2015-01-06 DIAGNOSIS — L570 Actinic keratosis: Secondary | ICD-10-CM

## 2015-01-06 DIAGNOSIS — I5032 Chronic diastolic (congestive) heart failure: Secondary | ICD-10-CM

## 2015-01-06 NOTE — Telephone Encounter (Signed)
Pts daughter left a vm requesting that an order for labs be sent to [facility] in [place] ,[place]. This was taken care of Marcy Salvo, LPN.

## 2015-01-08 LAB — BASIC METABOLIC PANEL WITH GFR
BUN: 22 mg/dL (ref 7–25)
CHLORIDE: 104 mmol/L (ref 98–110)
CO2: 22 mmol/L (ref 20–31)
Calcium: 8.8 mg/dL (ref 8.6–10.3)
Creat: 1.79 mg/dL — ABNORMAL HIGH (ref 0.70–1.18)
GFR, EST NON AFRICAN AMERICAN: 37 mL/min — AB (ref >=60)
GFR, Est African American: 43 mL/min — ABNORMAL LOW (ref >=60)
Glucose, Bld: 89 mg/dL (ref 65–99)
Potassium: 4.6 mmol/L (ref 3.5–5.3)
Sodium: 139 mmol/L (ref 135–146)

## 2015-01-08 NOTE — Progress Notes (Signed)
Quick Note:  Labs OK no changes f/u with Dr/ t next week. ______

## 2015-01-13 ENCOUNTER — Ambulatory Visit (INDEPENDENT_AMBULATORY_CARE_PROVIDER_SITE_OTHER): Payer: Medicare Other | Admitting: Family Medicine

## 2015-01-13 ENCOUNTER — Encounter: Payer: Self-pay | Admitting: Family Medicine

## 2015-01-13 VITALS — BP 161/73 | HR 71 | Wt 206.0 lb

## 2015-01-13 DIAGNOSIS — L299 Pruritus, unspecified: Secondary | ICD-10-CM | POA: Diagnosis not present

## 2015-01-13 DIAGNOSIS — R609 Edema, unspecified: Secondary | ICD-10-CM | POA: Diagnosis not present

## 2015-01-13 DIAGNOSIS — I5032 Chronic diastolic (congestive) heart failure: Secondary | ICD-10-CM

## 2015-01-13 DIAGNOSIS — M7989 Other specified soft tissue disorders: Secondary | ICD-10-CM | POA: Insufficient documentation

## 2015-01-13 LAB — COMPLETE METABOLIC PANEL WITH GFR
ALT: 11 U/L (ref 9–46)
AST: 14 U/L (ref 10–35)
Albumin: 3.5 g/dL — ABNORMAL LOW (ref 3.6–5.1)
Alkaline Phosphatase: 45 U/L (ref 40–115)
BUN: 18 mg/dL (ref 7–25)
CO2: 22 mmol/L (ref 20–31)
CREATININE: 1.6 mg/dL — AB (ref 0.70–1.18)
Calcium: 8.7 mg/dL (ref 8.6–10.3)
Chloride: 107 mmol/L (ref 98–110)
GFR, Est African American: 49 mL/min — ABNORMAL LOW (ref >=60)
GFR, Est Non African American: 42 mL/min — ABNORMAL LOW (ref >=60)
Glucose, Bld: 94 mg/dL (ref 65–99)
POTASSIUM: 5.2 mmol/L (ref 3.5–5.3)
SODIUM: 139 mmol/L (ref 135–146)
Total Bilirubin: 0.3 mg/dL (ref 0.2–1.2)
Total Protein: 6.4 g/dL (ref 6.1–8.1)

## 2015-01-13 MED ORDER — FUROSEMIDE 20 MG PO TABS
20.0000 mg | ORAL_TABLET | Freq: Every day | ORAL | Status: DC
Start: 2015-01-13 — End: 2015-02-13

## 2015-01-13 MED ORDER — BENAZEPRIL HCL 20 MG PO TABS: 10.0000 mg | ORAL_TABLET | Freq: Every day | ORAL | Status: DC

## 2015-01-13 MED ORDER — METOPROLOL SUCCINATE ER 50 MG PO TB24: 50.0000 mg | ORAL_TABLET | Freq: Every day | ORAL | Status: DC

## 2015-01-13 MED ORDER — TRIAMCINOLONE 0.1 % CREAM:EUCERIN CREAM 1:1: 1.0000 "application " | TOPICAL_CREAM | Freq: Two times a day (BID) | CUTANEOUS | Status: DC | PRN

## 2015-01-13 NOTE — Assessment & Plan Note (Signed)
a 

## 2015-01-13 NOTE — Progress Notes (Signed)
[name] is a 74 y.o. male who presents to [facility]  today for follow-up heart failure and leg swelling. Patient feels well with no chest pains or palpitations or shortness of breath. He notes the leg swelling is persistent. He has reduced his benazepril to 10 mg daily and only is taking Lasix once daily now. He does note some itching across his whole body present for a few weeks now. He has tried some over-the-counter medicines which have helped. He notes persistent swelling of his left leg and is concerned about a DVT.   Past Medical History  Diagnosis Date  . Hypertension     2010  . Hyperlipidemia   . Degenerative arthritis of spine   . Hemorrhoids   . Solar keratosis   . Carotid artery occlusion   . Sciatica   . Colon polyps   . Diverticulosis   . Hemorrhoids    Past Surgical History  Procedure Laterality Date  . Neck mass excision    . Carotid endarterectomy  09/21/10    Right CEA   History  Substance Use Topics  . Smoking status: Former Smoker -- 1.00 packs/day for 50 years    Types: Cigarettes    Quit date: 10/14/2010  . Smokeless tobacco: Never Used  . Alcohol Use: 0.0 oz/week    1-2 drink(s) per week   ROS as above Medications: Current Outpatient Prescriptions  Medication Sig Dispense Refill  . Acetaminophen (TYLENOL ARTHRITIS PAIN PO) Take by mouth as needed.      Marland Kitchen aspirin (ASPIR-81) 81 MG EC tablet Take 1 tablet (81 mg total) by mouth daily. Swallow whole. 90 tablet 3  . benazepril (LOTENSIN) 20 MG tablet Take 0.5 tablets (10 mg total) by mouth daily. 45 tablet 3  . furosemide (LASIX) 20 MG tablet Take 1 tablet (20 mg total) by mouth daily. 60 tablet 2  . Lidocaine-Hydrocortisone Ace (ANAMANTLE HC RE) Anamantle HC Gel - apply to hemorrhoids 1-2 x a day as needed.     . meloxicam (MOBIC) 15 MG tablet One tab PO qAM with breakfast for 2 weeks, then daily prn pain. 30 tablet 3  . pravastatin (PRAVACHOL) 80 MG tablet TAKE 1  TABLET BY MOUTH EVERY NIGHT AT BEDTIME 90 tablet 0  . simvastatin (ZOCOR) 20 MG tablet Take 1 tablet (20 mg total) by mouth at bedtime. 90 tablet 0  . traMADol (ULTRAM) 50 MG tablet 1-2 tabs by mouth Q8 hours, maximum 6 tabs per day. 90 tablet 0  . metoprolol succinate (TOPROL-XL) 50 MG 24 hr tablet Take 1 tablet (50 mg total) by mouth daily. Take with or immediately following a meal. 30 tablet 1  . Triamcinolone Acetonide (TRIAMCINOLONE 0.1 % CREAM : EUCERIN) CREA Apply 1 application topically 2 (two) times daily as needed. 50/50. Disp 1 pound 1 each 1   No current facility-administered medications for this visit.   Allergies  Allergen Reactions  . Pollen Extract      Exam:  BP 161/73 mmHg  Pulse 71  Wt 206 lb (93.441 kg) Gen: Well NAD HEENT: EOMI,  MMM Lungs: Normal work of breathing. CTABL Heart: RRR no MRG Abd: NABS, Soft. Nondistended, Nontender Exts: Brisk capillary refill, warm and well perfused. 1+ pitting lower extremity edema bilaterally. Soft swelling left lateral lower leg present. No palpable cords. Calf diameters are normal and equal bilaterally Skin: No rash  No results found for this or any previous visit (from the past 24 hour(s)). No results  found.   Please see individual assessment and plan sections.

## 2015-01-13 NOTE — Assessment & Plan Note (Addendum)
Doing reasonably well. Creatinine continues to be elevated.  Continued decreased benazepril and Lasix dose. Add Toprol now. Recommend lower extremity compression stockings. Follow-up with PCP

## 2015-01-13 NOTE — Assessment & Plan Note (Signed)
Triamcinolone Eucerin cream.

## 2015-01-13 NOTE — Patient Instructions (Signed)
Thank you for coming in today. Call or go to the emergency room if you get worse, have trouble breathing, have chest pains, or palpitations.  Follow up with Dr. Karie Schwalbe in 2 weeks.   START metoporol.  You should hear from the ultrasound people.

## 2015-01-13 NOTE — Assessment & Plan Note (Signed)
Obtain vascular ultrasound to rule out DVT

## 2015-01-14 NOTE — Progress Notes (Signed)
Quick Note:  Normal, no changes. ______ 

## 2015-01-15 ENCOUNTER — Ambulatory Visit (HOSPITAL_COMMUNITY)
Admission: RE | Admit: 2015-01-15 | Discharge: 2015-01-15 | Disposition: A | Discharge status: Home / Self Care | Payer: Medicare Other | Source: Ambulatory Visit | Attending: Family Medicine | Admitting: Family Medicine

## 2015-01-15 DIAGNOSIS — M7989 Other specified soft tissue disorders: Secondary | ICD-10-CM | POA: Diagnosis not present

## 2015-01-15 NOTE — Progress Notes (Signed)
Preliminary results by tech - Left Lower Ext. Venous Duplex Completed. Negative for deep and superficial vein thrombosis in the left lower extremity. Dr. Denyse Amass was notified with these results. Marilynne Halsted, BS, RDMS, RVT

## 2015-01-16 NOTE — Progress Notes (Signed)
Quick Note:  No DVT on ultrasound. Follow up with Dr. Karie Schwalbe. ______

## 2015-01-27 ENCOUNTER — Ambulatory Visit (INDEPENDENT_AMBULATORY_CARE_PROVIDER_SITE_OTHER): Payer: Medicare Other | Admitting: Sports Medicine

## 2015-01-27 ENCOUNTER — Encounter: Payer: Self-pay | Admitting: Sports Medicine

## 2015-01-27 VITALS — BP 131/63 | HR 52 | Ht 71.5 in | Wt 200.0 lb

## 2015-01-27 DIAGNOSIS — L299 Pruritus, unspecified: Secondary | ICD-10-CM

## 2015-01-27 DIAGNOSIS — I5032 Chronic diastolic (congestive) heart failure: Secondary | ICD-10-CM | POA: Diagnosis not present

## 2015-01-27 DIAGNOSIS — R2243 Localized swelling, mass and lump, lower limb, bilateral: Secondary | ICD-10-CM

## 2015-01-27 DIAGNOSIS — E538 Deficiency of other specified B group vitamins: Secondary | ICD-10-CM

## 2015-01-27 DIAGNOSIS — R269 Unspecified abnormalities of gait and mobility: Secondary | ICD-10-CM | POA: Insufficient documentation

## 2015-01-27 DIAGNOSIS — R42 Dizziness and giddiness: Secondary | ICD-10-CM

## 2015-01-27 DIAGNOSIS — D519 Vitamin B12 deficiency anemia, unspecified: Secondary | ICD-10-CM

## 2015-01-27 MED ORDER — AMBULATORY NON FORMULARY MEDICATION: Status: AC

## 2015-01-27 MED ORDER — HYDROXYZINE HCL 50 MG PO TABS: 25.0000 mg | ORAL_TABLET | Freq: Three times a day (TID) | ORAL | Status: DC | PRN

## 2015-01-27 NOTE — Assessment & Plan Note (Addendum)
Relatively wide-based shuffling gait, resting tremor, as well as masklike face. Also with complaints of poor balance. No incontinence or dementia type symptoms to suggest normal pressure hydrocephalus. 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 Referral to neurology.  Labs do show low B12 level, anemia, elevated BNP. Patient will return for weekly B12 injections for 1 month, then monthly B12 injections. I'm also going to add an anemia workup.

## 2015-01-27 NOTE — Assessment & Plan Note (Signed)
Grade 2 diastolic heart failure. Currently slightly hypervolemic. Continue benazepril, metoprolol, Lasix. I'm not going to go up on the dose, he is having mild renal insufficiency as well as orthostasis, going to simply use compression stockings. Lower extremity Dopplers were negative. Recent echocardiogram was stable.

## 2015-01-27 NOTE — Progress Notes (Signed)
  Subjective:    CC: Follow-up  HPI: Diastolic congestive heart failure: On the appropriate medications including ACE inhibitor, diuretics, and beta blockers, overall down to 200 pounds and denies any orthopnea or paroxysmal nocturnal dyspnea. His exercise tolerance is good however he is limited due to a sensation of being off balance. He does also describe an element of orthostatic symptoms. He has not been wearing any of his compression hose.  Instability: Patient notes abnormalities and instability of gait, he does walk with a wide-based gait, shuffling, and no further questioning his daughter does note a resting tremor. She also notes that his face is often flat and without expression.  Imaging: Unable to cover sufficient area with topical creams, amenable to trying oral medication, bilirubin levels were normal.  Past medical history, Surgical history, Family history not pertinant except as noted below, Social history, Allergies, and medications have been entered into the medical record, reviewed, and no changes needed.   Review of Systems: No fevers, chills, night sweats, weight loss, chest pain, or shortness of breath.   Objective:    General: Well Developed, well nourished, and in no acute distress.  Neuro: Alert and oriented x3, extra-ocular muscles intact, sensation grossly intact. Cranial nerves II through XII are intact, motor, sensory functions are intact, he does walk with a broad-based, shuffling gait, also exhibits a masklike facies, no tremor visible however his daughter tells me he does have a pill-rolling type resting tremor HEENT: Normocephalic, atraumatic, pupils equal round reactive to light, neck supple, no masses, no lymphadenopathy, thyroid nonpalpable.  Skin: Warm and dry, no rashes. Cardiac: Regular rate and rhythm, no murmurs rubs or gallops, no lower extremity edema.  Respiratory: Clear to auscultation bilaterally. Not using accessory muscles, speaking in full  sentences. Legs: 2+ lower extremity pitting edema with a negative Homans sign bilaterally, did have a negative lower extremity Doppler. Both lower extremities were strapped with compressive dressing.  Impression and Recommendations:   I spent 40 minutes with this patient, greater than 50% was face-to-face time counseling regarding the above diagnoses

## 2015-01-27 NOTE — Assessment & Plan Note (Signed)
Adding B12 levels, TSH, vestibular rehabilitation.

## 2015-01-27 NOTE — Assessment & Plan Note (Signed)
Adding hydroxyzine.

## 2015-01-28 DIAGNOSIS — D531 Other megaloblastic anemias, not elsewhere classified: Secondary | ICD-10-CM | POA: Insufficient documentation

## 2015-01-28 LAB — COMPREHENSIVE METABOLIC PANEL
Albumin: 3.7 g/dL (ref 3.6–5.1)
BUN: 26 mg/dL — ABNORMAL HIGH (ref 7–25)
Chloride: 105 mmol/L (ref 98–110)
Potassium: 4.3 mmol/L (ref 3.5–5.3)
Total Protein: 6.5 g/dL (ref 6.1–8.1)

## 2015-01-28 LAB — COMPREHENSIVE METABOLIC PANEL WITH GFR
ALT: 8 U/L — ABNORMAL LOW (ref 9–46)
AST: 14 U/L (ref 10–35)
Alkaline Phosphatase: 35 U/L — ABNORMAL LOW (ref 40–115)
CO2: 23 mmol/L (ref 20–31)
Calcium: 9 mg/dL (ref 8.6–10.3)
Creat: 1.85 mg/dL — ABNORMAL HIGH (ref 0.70–1.18)
Glucose, Bld: 85 mg/dL (ref 65–99)
Sodium: 137 mmol/L (ref 135–146)
Total Bilirubin: 0.4 mg/dL (ref 0.2–1.2)

## 2015-01-28 LAB — CBC
HCT: 27.7 % — ABNORMAL LOW (ref 39.0–52.0)
Hemoglobin: 8.1 g/dL — ABNORMAL LOW (ref 13.0–17.0)
MCH: 21.3 pg — ABNORMAL LOW (ref 26.0–34.0)
MCHC: 29.2 g/dL — ABNORMAL LOW (ref 30.0–36.0)
MCV: 72.9 fL — ABNORMAL LOW (ref 78.0–100.0)
MPV: 9.6 fL (ref 8.6–12.4)
Platelets: 238 10*3/uL (ref 150–400)
RBC: 3.8 MIL/uL — ABNORMAL LOW (ref 4.22–5.81)
RDW: 19.2 % — ABNORMAL HIGH (ref 11.5–15.5)
WBC: 6.7 K/uL (ref 4.0–10.5)

## 2015-01-28 LAB — FOLATE: Folate: 4.2 ng/mL

## 2015-01-28 LAB — TSH: TSH: 0.44 u[IU]/mL (ref 0.350–4.500)

## 2015-01-28 LAB — VITAMIN B12: Vitamin B-12: 185 pg/mL — ABNORMAL LOW (ref 211–911)

## 2015-01-28 LAB — BRAIN NATRIURETIC PEPTIDE: Brain Natriuretic Peptide: 621.2 pg/mL — ABNORMAL HIGH (ref 0.0–100.0)

## 2015-01-28 NOTE — Addendum Note (Signed)
Addended by: Monica Becton on: 01/28/2015 11:59 AM   Modules accepted: Orders

## 2015-01-28 NOTE — Assessment & Plan Note (Signed)
Labs do show low B12 level, anemia, elevated BNP. Surprisingly he is microcytic with anemia. Patient will return for weekly B12 injections for 1 month, then monthly B12 injections. I'm also going to add an anemia workup.

## 2015-01-29 ENCOUNTER — Ambulatory Visit (INDEPENDENT_AMBULATORY_CARE_PROVIDER_SITE_OTHER): Payer: Medicare Other | Admitting: Sports Medicine

## 2015-01-29 DIAGNOSIS — E538 Deficiency of other specified B group vitamins: Secondary | ICD-10-CM

## 2015-01-30 DIAGNOSIS — E538 Deficiency of other specified B group vitamins: Secondary | ICD-10-CM | POA: Diagnosis not present

## 2015-01-30 LAB — CBC
HCT: 27.5 % — ABNORMAL LOW (ref 39.0–52.0)
Hemoglobin: 8.1 g/dL — ABNORMAL LOW (ref 13.0–17.0)
MCH: 21.4 pg — ABNORMAL LOW (ref 26.0–34.0)
MCHC: 29.5 g/dL — ABNORMAL LOW (ref 30.0–36.0)
MCV: 72.6 fL — ABNORMAL LOW (ref 78.0–100.0)
MPV: 9.2 fL (ref 8.6–12.4)
Platelets: 225 K/uL (ref 150–400)
RBC: 3.79 MIL/uL — ABNORMAL LOW (ref 4.22–5.81)
RDW: 19.5 % — ABNORMAL HIGH (ref 11.5–15.5)
WBC: 5.4 K/uL (ref 4.0–10.5)

## 2015-01-30 LAB — PATHOLOGIST SMEAR REVIEW

## 2015-01-30 LAB — RETICULOCYTES
ABS Retic: 37.9 10*3/uL (ref 19.0–186.0)
RBC.: 3.79 MIL/uL — ABNORMAL LOW (ref 4.22–5.81)
Retic Ct Pct: 1 % (ref 0.4–2.3)

## 2015-01-30 LAB — IRON AND TIBC
%SAT: 5 % — ABNORMAL LOW (ref 20–55)
Iron: 20 ug/dL — ABNORMAL LOW (ref 42–165)
TIBC: 411 ug/dL (ref 215–435)
UIBC: 391 ug/dL (ref 125–400)

## 2015-01-30 LAB — HOMOCYSTEINE: Homocysteine: 29.4 umol/L — ABNORMAL HIGH (ref <11.4)

## 2015-01-30 LAB — VITAMIN B12: Vitamin B-12: >2000 pg/mL — ABNORMAL HIGH (ref 211–911)

## 2015-01-30 LAB — FOLATE: Folate: 5.8 ng/mL

## 2015-01-30 LAB — FERRITIN: Ferritin: 7 ng/mL — ABNORMAL LOW (ref 22–322)

## 2015-01-30 MED ORDER — CYANOCOBALAMIN 1000 MCG/ML IJ SOLN
1000.0000 ug | Freq: Once | INTRAMUSCULAR | Status: AC
  Administered 2015-01-30: 1000 ug via INTRAMUSCULAR

## 2015-01-30 NOTE — Progress Notes (Signed)
   Subjective:    Patient ID: [name], male    DOB: 06/12/1940, 74 y.o.   MRN: [id number]  HPI  Ramone Gander is here for a vitamin B 12 injection.   Review of Systems     Objective:   Physical Exam        Assessment & Plan:  Patient tolerated injection well without complications. Patient advised to schedule next injection 7 days from today.

## 2015-01-31 LAB — METHYLMALONIC ACID, SERUM: Methylmalonic Acid, Quant: 396 nmol/L — ABNORMAL HIGH (ref 87–318)

## 2015-02-06 ENCOUNTER — Ambulatory Visit (INDEPENDENT_AMBULATORY_CARE_PROVIDER_SITE_OTHER): Payer: Medicare Other | Admitting: Sports Medicine

## 2015-02-06 VITALS — BP 102/55 | HR 52 | Wt 196.0 lb

## 2015-02-06 DIAGNOSIS — I10 Essential (primary) hypertension: Secondary | ICD-10-CM

## 2015-02-06 DIAGNOSIS — D519 Vitamin B12 deficiency anemia, unspecified: Secondary | ICD-10-CM

## 2015-02-06 MED ORDER — CYANOCOBALAMIN 1000 MCG/ML IJ SOLN
1000.0000 ug | Freq: Once | INTRAMUSCULAR | Status: AC
  Administered 2015-02-06: 1000 ug via INTRAMUSCULAR

## 2015-02-06 NOTE — Assessment & Plan Note (Signed)
Blood pressure minimally low, patient also clinically and symptomatically orthostatic. Discontinue furosemide and increase hydration the next week, and then come back to recheck blood pressure and weight.

## 2015-02-06 NOTE — Progress Notes (Signed)
Patient came into clinic today for B12 injection. Pt BP was low today, he states it has "felt low lately." Pt describes that feeling as weakness, fatigue, and blurred vision/light headed if he stands too quickly. Advised I would route this information to his PCP and see if any Rx adjustments are advised. Pt was advised I would contact him with any new information. Pt tolerated B12 injection in Rt deltoid well with no immediate complications. Pt scheduled his next f/u appt prior to leaving clinic today.

## 2015-02-07 NOTE — Progress Notes (Signed)
Left information on Pt's voicemail, provided callback information for any questions he may have. Pt is scheduled to come back in next week for B12 injection, will recheck his BP at that time.

## 2015-02-12 ENCOUNTER — Ambulatory Visit (INDEPENDENT_AMBULATORY_CARE_PROVIDER_SITE_OTHER): Payer: Medicare Other | Admitting: Family Medicine

## 2015-02-12 VITALS — BP 88/46 | HR 49 | Wt 198.0 lb

## 2015-02-12 DIAGNOSIS — I10 Essential (primary) hypertension: Secondary | ICD-10-CM | POA: Diagnosis not present

## 2015-02-12 DIAGNOSIS — D519 Vitamin B12 deficiency anemia, unspecified: Secondary | ICD-10-CM | POA: Diagnosis not present

## 2015-02-12 MED ORDER — METOPROLOL SUCCINATE ER 25 MG PO TB24: 25.0000 mg | ORAL_TABLET | Freq: Every day | ORAL | Status: DC

## 2015-02-12 MED ORDER — CYANOCOBALAMIN 1000 MCG/ML IJ SOLN
1000.0000 ug | Freq: Once | INTRAMUSCULAR | Status: AC
  Administered 2015-02-12: 1000 ug via INTRAMUSCULAR

## 2015-02-12 NOTE — Progress Notes (Signed)
   Subjective:    Patient ID: [name], male    DOB: 01-11-1941, 74 y.o.   MRN: [id number]  HPI Patient came into clinic for his B12 injection. Pt BP was low at his last visit and it was lower today. Pt states he has continued to hold his Lasix, increased his fluids, and has been monitoring for edema. Pt reports he has continued taking his metoprolol at night and his benazepril daily. Pt does report he still gets the "dizzy feeling" if he stands to quickly but it "passes," no symptoms at this time.   Review of Systems     Objective:   Physical Exam        Assessment & Plan:  Pt tolerated B12 injection in left deltoid well, with no immediate complications. Advised Pt I would contact him with any other changes, he does have an upcoming appt with Neurology tomorrow (02/13/15) and an appt with PCP on 02/17/15.

## 2015-02-12 NOTE — Progress Notes (Signed)
Pt advised of Rx change. States he has 3 pills of the Metoprolol  left, so he will cut them in half then go get the new Rx for  when he runs out. Advised him to keep his future appt. Verbalized understanding.

## 2015-02-12 NOTE — Progress Notes (Signed)
Kelsi, Will you please let patient know that his BP appears to be abnormally low.  I'd recommend changing his metoprolol to  daily.  A new Rx was sent to his [facility] [address].  I'd recommend f/u with Dr. Karie Schwalbe sometime next week.

## 2015-02-13 ENCOUNTER — Ambulatory Visit (INDEPENDENT_AMBULATORY_CARE_PROVIDER_SITE_OTHER): Payer: Medicare Other | Admitting: Neurology

## 2015-02-13 ENCOUNTER — Encounter: Payer: Self-pay | Admitting: Neurology

## 2015-02-13 VITALS — BP 160/58 | HR 41 | Ht 71.0 in | Wt 198.0 lb

## 2015-02-13 DIAGNOSIS — R269 Unspecified abnormalities of gait and mobility: Secondary | ICD-10-CM

## 2015-02-13 NOTE — Progress Notes (Signed)
Reason for visit: Gait disturbance  Referring physician: Dr. Raymond Gurney [name] is a 73 y.o. 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 He had a carotid endarterectomy on the right side about 4 years ago, and he believes that his balance was slightly off following the surgery. The patient has seemed to gradually worsen with the gait instability over the last 2 years, but over the last 2 months in particular the walking has become more of a problem. He has had occasional falls, the last fall was 1 month ago. He has reports of dizziness with standing, at times he has been noted to have low blood pressures. The patient indicates that when he sits down the sensation of imbalance will improve, if he is walking and he touches a wall or table, the sensation of imbalance improves. The patient does have some numbness sensations in the feet, no burning or stinging in the feet at night. He does have a history of low back pain, MRI evaluation of the low back done in 2014 showed some mild spondylosis, no significant spinal stenosis was noted. The patient reports generalized fatigue. He does report some urgency of the bladder, no incontinence. He denies any neck pain or pain down the arms. He reports no issues with focal numbness or weakness of the arms or legs, and no issues with memory. He denies any headaches, or visual disturbances. He recently was found to have a low vitamin B12 level of 185 with a slightly elevated methylmalonic acid level. He is now on B12 shots, this was just recently started 3 weeks ago. He comes to this office for evaluation of the gait disturbance.  Past Medical History  Diagnosis Date  . Hypertension     2010  . Hyperlipidemia   . Degenerative arthritis of spine   . Hemorrhoids   . Solar keratosis   . Carotid artery occlusion   . Sciatica   . Colon polyps   .  Diverticulosis   . Hemorrhoids   . Vitamin B12 deficiency     Past Surgical History  Procedure Laterality Date  . Neck mass excision    . Carotid endarterectomy  09/21/10    Right CEA    Family History  Problem Relation Age of Onset  . Dementia Mother   . Heart attack Father     First MI age 79.  Marland Kitchen Heart disease Father 70  . Heart attack Brother     CAD, prior MI  . Hypertension Brother   . ALS Sister     Social history:  reports that he quit smoking about 4 years ago. His smoking use included Cigarettes. He has a 50 pack-year smoking history. He has never used smokeless tobacco. He reports that he drinks alcohol. He reports that he does not use illicit drugs.  Medications:  Prior to Admission medications   Medication Sig Start Date End Date Taking? Authorizing Provider  Acetaminophen (TYLENOL ARTHRITIS PAIN PO) Take by mouth as needed.     Yes Historical Provider, MD  AMBULATORY NON FORMULARY MEDICATION Knee-high, medium compression, graduated compression stockings. Apply to lower extremities. [email or web address], Zippered Compression Stockings, medium circ, long length 01/27/15  Yes Monica Becton, MD  benazepril (LOTENSIN) 20 MG tablet Take 0.5 tablets (10 mg total) by mouth daily. 01/13/15  Yes Rodolph Bong, MD  cyanocobalamin (,VITAMIN B-12,) 1000 MCG/ML injection  1000 mg weekly for 1 month, then monthly. 01/28/15  Yes Monica Becton, MD  hydrOXYzine (ATARAX/VISTARIL) 50 MG tablet Take 0.5 tablets (25 mg total) by mouth 3 (three) times daily as needed for itching. 01/27/15  Yes Monica Becton, MD  metoprolol succinate (TOPROL-XL) 25 MG 24 hr tablet Take 1 tablet (25 mg total) by mouth daily. Follow up with Dr. Karie Schwalbe on/after September 14th 2016 02/12/15  Yes Laren Boom, DO      Allergies  Allergen Reactions  . Pollen Extract     ROS:  Out of a complete 14 system review of symptoms, the patient complains only of the following symptoms, and all other  reviewed systems are negative.  Fatigue Chest pain, palpitations of the heart, swelling in the legs Ringing in the ears, dizziness Itching Blurred vision Shortness of breath, snoring Blood in stools, diarrhea, constipation Urinary urgency Easy bruising, easy bleeding Feeling cold Joint pain Skin sensitivity Numbness, weakness, slurred speech, dizziness, tremor Anxiety, decreased energy Sleepiness  Blood pressure 160/58, pulse 41, height  (1.803 m), weight 198 lb (89.812 kg).  Physical Exam  General: The patient is alert and cooperative at the time of the examination.  Eyes: Pupils are equal, round, and reactive to light. Discs are flat bilaterally.  Neck: The neck is supple, no carotid bruits are noted.  Respiratory: The respiratory examination is clear.  Cardiovascular: The cardiovascular examination reveals a regular rate and rhythm, no obvious murmurs or rubs are noted.  Skin: Extremities are with 2+ edema on the lower extremities, right somewhat more prominent than the left.  Neurologic Exam  Mental status: The patient is alert and oriented x 3 at the time of the examination. The patient has apparent normal recent and remote memory, with an apparently normal attention span and concentration ability.  Cranial nerves: Facial symmetry is present. There is good sensation of the face to pinprick and soft touch on the right, slightly decreased on the left. The strength of the facial muscles and the muscles to head turning and shoulder shrug are normal bilaterally. Speech is well enunciated, no aphasia or dysarthria is noted. Extraocular movements are full. Visual fields are full. The tongue is midline, and the patient has symmetric elevation of the soft palate. No obvious hearing deficits are noted.  Motor: The motor testing reveals 5 over 5 strength of all 4 extremities. Good symmetric motor tone is noted throughout.  Sensory: Sensory testing is intact to pinprick, soft  touch, vibration sensation, and position sense on the upper extremities, with exception that soft touch sensation is slightly decreased on the left arm. With the lower extremity, there is a stocking pattern pinprick sensory deficit up to the knees bilaterally, vibration sensation is impaired in both feet, position sensation is relatively normal in both feet. No evidence of extinction is noted.  Coordination: Cerebellar testing reveals good finger-nose-finger and heel-to-shin bilaterally.  Gait and station: Gait is wide-based, unsteady. Tandem gait is unsteady. Romberg is negative. No drift is seen.  Reflexes: Deep tendon reflexes are symmetric, but are depressed bilaterally. Toes are downgoing bilaterally.   Assessment/Plan:  1. Gait disturbance  2. Vitamin B12 deficiency  3. History of cerebrovascular disease, right carotid endarterectomy  The patient recently was found to have a vitamin B12 deficiency, this could have some impact on the balance. The patient appears to have some features on clinical examination that could be consistent with a peripheral neuropathy. He will be set up for nerve conduction studies of both  legs, and one arm. He will have EMG evaluation of one leg. Given the history of cerebrovascular disease, he will be set up for MRI evaluation of the brain. Further blood work may be done if the patient has been documented to have a peripheral neuropathy. He will continue the vitamin B12 supplementation, if this issue has had any impact on his balance, he may see some improvement over the next several months. The patient will be set up for physical therapy for gait training. He will follow-up for the EMG evaluation.   Marlan Palau MD 02/13/2015 7:07 PM  [facility] [address] [place], [place] [address]  Phone [phone] Fax [phone]

## 2015-02-13 NOTE — Patient Instructions (Addendum)
We will get you set up for MRI evaluation of the brain, and look at the peripheral nerve function the legs with EMG and nerve conduction studies. He will get you set up for physical therapy to work on balance training and safety.  Fall Prevention and Home Safety Falls cause injuries and can affect all age groups. It is possible to use preventive measures to significantly decrease the likelihood of falls. There are many simple measures which can make your home safer and prevent falls. OUTDOORS  Repair cracks and edges of walkways and driveways.  Remove high doorway thresholds.  Trim shrubbery on the main path into your home.  Have good outside lighting.  Clear walkways of tools, rocks, debris, and clutter.  Check that handrails are not broken and are securely fastened. Both sides of steps should have handrails.  Have leaves, snow, and ice cleared regularly.  Use sand or salt on walkways during winter months.  In the garage, clean up grease or oil spills. BATHROOM  Install night lights.  Install grab bars by the toilet and in the tub and shower.  Use non-skid mats or decals in the tub or shower.  Place a plastic non-slip stool in the shower to sit on, if needed.  Keep floors dry and clean up all water on the floor immediately.  Remove soap buildup in the tub or shower on a regular basis.  Secure bath mats with non-slip, double-sided rug tape.  Remove throw rugs and tripping hazards from the floors. BEDROOMS  Install night lights.  Make sure a bedside light is easy to reach.  Do not use oversized bedding.  Keep a telephone by your bedside.  Have a firm chair with side arms to use for getting dressed.  Remove throw rugs and tripping hazards from the floor. KITCHEN  Keep handles on pots and pans turned toward the center of the stove. Use back burners when possible.  Clean up spills quickly and allow time for drying.  Avoid walking on wet floors.  Avoid hot  utensils and knives.  Position shelves so they are not too high or low.  Place commonly used objects within easy reach.  If necessary, use a sturdy step stool with a grab bar when reaching.  Keep electrical cables out of the way.  Do not use floor polish or wax that makes floors slippery. If you must use wax, use non-skid floor wax.  Remove throw rugs and tripping hazards from the floor. STAIRWAYS  Never leave objects on stairs.  Place handrails on both sides of stairways and use them. Fix any loose handrails. Make sure handrails on both sides of the stairways are as long as the stairs.  Check carpeting to make sure it is firmly attached along stairs. Make repairs to worn or loose carpet promptly.  Avoid placing throw rugs at the top or bottom of stairways, or properly secure the rug with carpet tape to prevent slippage. Get rid of throw rugs, if possible.  Have an electrician put in a light switch at the top and bottom of the stairs. OTHER FALL PREVENTION TIPS  Wear low-heel or rubber-soled shoes that are supportive and fit well. Wear closed toe shoes.  When using a stepladder, make sure it is fully opened and both spreaders are firmly locked. Do not climb a closed stepladder.  Add color or contrast paint or tape to grab bars and handrails in your home. Place contrasting color strips on first and last steps.  Learn  and use mobility aids as needed. Install an electrical emergency response system.  Turn on lights to avoid dark areas. Replace light bulbs that burn out immediately. Get light switches that glow.  Arrange furniture to create clear pathways. Keep furniture in the same place.  Firmly attach carpet with non-skid or double-sided tape.  Eliminate uneven floor surfaces.  Select a carpet pattern that does not visually hide the edge of steps.  Be aware of all pets. OTHER HOME SAFETY TIPS  Set the water temperature for 120 F (48.8 C).  Keep emergency numbers on  or near the telephone.  Keep smoke detectors on every level of the home and near sleeping areas. Document Released: 05/14/2002 Document Revised: 11/23/2011 Document Reviewed: 08/13/2011 [facility] Patient Information 2015 [place], [place]. This information is not intended to replace advice given to you by your health care provider. Make sure you discuss any questions you have with your health care provider.

## 2015-02-17 ENCOUNTER — Encounter: Payer: Self-pay | Admitting: Sports Medicine

## 2015-02-17 ENCOUNTER — Ambulatory Visit (INDEPENDENT_AMBULATORY_CARE_PROVIDER_SITE_OTHER): Payer: Medicare Other | Admitting: Sports Medicine

## 2015-02-17 VITALS — BP 122/56 | HR 58 | Wt 199.0 lb

## 2015-02-17 DIAGNOSIS — Z1211 Encounter for screening for malignant neoplasm of colon: Secondary | ICD-10-CM | POA: Diagnosis not present

## 2015-02-17 DIAGNOSIS — Z23 Encounter for immunization: Secondary | ICD-10-CM | POA: Diagnosis not present

## 2015-02-17 DIAGNOSIS — D531 Other megaloblastic anemias, not elsewhere classified: Secondary | ICD-10-CM | POA: Diagnosis not present

## 2015-02-17 DIAGNOSIS — E538 Deficiency of other specified B group vitamins: Secondary | ICD-10-CM | POA: Diagnosis not present

## 2015-02-17 LAB — HEMOCCULT GUIAC POC 1CARD (OFFICE): Fecal Occult Blood, POC: NEGATIVE

## 2015-02-17 MED ORDER — FOLIC ACID 5 MG PO CAPS: 1.0000 | ORAL_CAPSULE | Freq: Every day | ORAL | Status: DC

## 2015-02-17 MED ORDER — FERROUS SULFATE 325 (65 FE) MG PO TBEC: 325.0000 mg | DELAYED_RELEASE_TABLET | Freq: Three times a day (TID) | ORAL | Status: AC

## 2015-02-17 MED ORDER — CYANOCOBALAMIN 1000 MCG/ML IJ SOLN
1000.0000 ug | Freq: Once | INTRAMUSCULAR | Status: AC
  Administered 2015-02-17: 1000 ug via INTRAMUSCULAR

## 2015-02-17 NOTE — Progress Notes (Signed)
  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 Anemia was with a hemoglobin in the eights. We also had him touch base with neurology as he had a masklike face, shuffling gait, and a tremor noted by his daughter,the neurology visit resulted in nerve conduction and EMG as well as brain MRI, the full workup is not yet done however the neurologist didn't want to wait until we have his B12 levels corrected. Overall he tells me he feels significantly better since starting B12 injections, we have not yet started folate or iron supplementation.  Past medical history, Surgical history, Family history not pertinant except as noted below, Social history, Allergies, and medications have been entered into the medical record, reviewed, and no changes needed.   Review of Systems: No fevers, chills, night sweats, weight loss, chest pain, or shortness of breath.   Objective:    General: Well Developed, well nourished, and in no acute distress.  Neuro: Alert and oriented x3, extra-ocular muscles intact, sensation grossly intact.  HEENT: Normocephalic, atraumatic, pupils equal round reactive to light, neck supple, no masses, no lymphadenopathy, thyroid nonpalpable.  Skin: Warm and dry, no rashes. Cardiac: Regular rate and rhythm, no murmurs rubs or gallops, no lower extremity edema.  Respiratory: Clear to auscultation bilaterally. Not using accessory muscles, speaking in full sentences. Rectal: Hemorrhoids externally, good tone, smooth prostate, Hemoccult-negative.  Impression and Recommendations:    I spent 25 minutes with this patient,  greater than 50% was face-to-face time counseling regarding the above diagnoses

## 2015-02-17 NOTE — Assessment & Plan Note (Signed)
Improving significantly with B12 supplementation, he did have an elevated homocysteine level and a significantly low iron level with a negative Hemoccults. Adding 5 mg folic acid daily as well as high-dose iron supplementation. If he doesn't tolerate oral iron we will set him up for iron infusions.

## 2015-02-21 ENCOUNTER — Telehealth: Payer: Self-pay

## 2015-02-21 DIAGNOSIS — D531 Other megaloblastic anemias, not elsewhere classified: Secondary | ICD-10-CM

## 2015-02-21 MED ORDER — FOLIC ACID 1 MG PO TABS: 5.0000 mg | ORAL_TABLET | Freq: Every day | ORAL | Status: AC

## 2015-02-21 NOTE — Telephone Encounter (Signed)
Spoke to patient gave him instructions as noted below. Rhonda Cunningham,CMA  

## 2015-02-21 NOTE — Telephone Encounter (Signed)
PATIENT CALLED STATED THAT [facility] DID NOT HAVE THE FOLIC ACID MEDICATION SO PATIENT WANTS TO KNOW WHAT DOES HE NEED TO TAKE. Rhonda Cunningham,CMA

## 2015-02-21 NOTE — Telephone Encounter (Signed)
They can simply do folic acid 1 mg and he can do 5 pills daily. I'm going to switch to this.

## 2015-03-01 ENCOUNTER — Ambulatory Visit
Admission: RE | Admit: 2015-03-01 | Discharge: 2015-03-01 | Disposition: A | Discharge status: Home / Self Care | Payer: Medicare Other | Source: Ambulatory Visit | Attending: Neurology | Admitting: Neurology

## 2015-03-01 DIAGNOSIS — R269 Unspecified abnormalities of gait and mobility: Secondary | ICD-10-CM | POA: Diagnosis not present

## 2015-03-03 ENCOUNTER — Telehealth: Payer: Self-pay | Admitting: Neurology

## 2015-03-03 NOTE — Telephone Encounter (Signed)
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 Likely not the source of the walking problems. I will be doing EMG evaluation.   MRI brain 03/01/15:  IMPRESSION: This is an abnormal MRI of the brain without contrast showing the following: 1. Moderate cortical atrophy. 2. Scattered T2/flair hyperintense foci most consistent with chronic microvascular ischemic changes.  3. There are no acute findings.

## 2015-03-17 ENCOUNTER — Ambulatory Visit (INDEPENDENT_AMBULATORY_CARE_PROVIDER_SITE_OTHER): Payer: Self-pay | Admitting: Neurology

## 2015-03-17 ENCOUNTER — Ambulatory Visit (INDEPENDENT_AMBULATORY_CARE_PROVIDER_SITE_OTHER): Payer: Medicare Other | Admitting: Neurology

## 2015-03-17 ENCOUNTER — Encounter: Payer: Self-pay | Admitting: Neurology

## 2015-03-17 DIAGNOSIS — R202 Paresthesia of skin: Secondary | ICD-10-CM | POA: Diagnosis not present

## 2015-03-17 DIAGNOSIS — R2689 Other abnormalities of gait and mobility: Secondary | ICD-10-CM | POA: Diagnosis not present

## 2015-03-17 DIAGNOSIS — D531 Other megaloblastic anemias, not elsewhere classified: Secondary | ICD-10-CM

## 2015-03-17 DIAGNOSIS — R269 Unspecified abnormalities of gait and mobility: Secondary | ICD-10-CM

## 2015-03-17 NOTE — Progress Notes (Signed)
The patient comes back for EMG and nerve conduction study evaluation today. He believes that some of the sensory changes in the legs has improved on B12 shots, his balance is improved slightly. No falls since last seen.  Nerve conduction study does show some lowering of motor amplitudes in the legs, patient does have some peripheral edema. No evidence of a peripheral neuropathy is seen. EMG of the right leg is unremarkable.  The patient will be followed conservatively, he seems to be improving some. He will follow-up in 4 months. It is possible that the B12 deficiency may be the etiology of his walking problems.

## 2015-03-17 NOTE — Procedures (Signed)
     HISTORY:  [name] is a 74 year old gentleman with a history of a gait disorder. He was found to have a vitamin B12 deficiency, he has gotten B12 shots. He has noted that the numbness in the legs has descended into the feet, and his balance has improved somewhat. He has not had any falls since last seen. He returns for EMG and nerve conduction study evaluation.  NERVE CONDUCTION STUDIES:  Nerve conduction studies were performed on the left upper extremity. The distal motor latencies and motor amplitudes for the median and ulnar nerves were within normal limits. The F wave latencies and nerve conduction velocities for these nerves were also normal. The sensory latencies for the median and ulnar nerves were normal.  Nerve conduction studies were performed on both lower extremities. The distal motor latencies for the peroneal and posterior tibial nerves were within normal limits. The motor amplitudes for these nerves were low. The nerve conduction velocities for these nerves were also normal. The H reflex latencies normal. The sensory latencies for the peroneal nerves were within normal limits.   EMG STUDIES:  EMG study was performed on the right lower extremity:  The tibialis anterior muscle reveals 2 to 4K motor units with full recruitment. No fibrillations or positive waves were seen. The peroneus tertius muscle reveals 2 to 4K motor units with full recruitment. No fibrillations or positive waves were seen. The medial gastrocnemius muscle reveals 1 to 3K motor units with full recruitment. No fibrillations or positive waves were seen. The vastus lateralis muscle reveals 2 to 4K motor units with full recruitment. No fibrillations or positive waves were seen. The iliopsoas muscle reveals 2 to 4K motor units with full recruitment. No fibrillations or positive waves were seen. The biceps femoris muscle (long head) reveals 2 to 4K motor units with full recruitment. No fibrillations or positive  waves were seen. The lumbosacral paraspinal muscles were tested at 3 levels, and revealed no abnormalities of insertional activity at all 3 levels tested. There was good relaxation.   IMPRESSION:  Nerve conduction studies done on the left upper extremity and both lower extremities is relatively unremarkable with exception of some lowering of motor amplitudes in the lower extremity peripheral nerves. No clear evidence of a sensorimotor peripheral neuropathy is seen. The patient does have some peripheral edema. EMG evaluation of the right lower extremity is unremarkable, without evidence of an overlying lumbosacral radiculopathy.  Marlan Palau MD 03/17/2015 1:43 PM  [facility] [address] [place], [place] [address]  Phone [phone] Fax [phone]

## 2015-03-17 NOTE — Progress Notes (Signed)
Please refer to EMG and nerve conduction study procedure note. 

## 2015-03-18 ENCOUNTER — Ambulatory Visit (INDEPENDENT_AMBULATORY_CARE_PROVIDER_SITE_OTHER): Payer: Medicare Other | Admitting: Sports Medicine

## 2015-03-18 VITALS — BP 141/46 | HR 48 | Wt 198.0 lb

## 2015-03-18 DIAGNOSIS — D531 Other megaloblastic anemias, not elsewhere classified: Secondary | ICD-10-CM

## 2015-03-18 DIAGNOSIS — L299 Pruritus, unspecified: Secondary | ICD-10-CM

## 2015-03-18 DIAGNOSIS — D649 Anemia, unspecified: Secondary | ICD-10-CM | POA: Diagnosis not present

## 2015-03-18 MED ORDER — CYANOCOBALAMIN 1000 MCG/ML IJ SOLN
1000.0000 ug | Freq: Once | INTRAMUSCULAR | Status: AC
  Administered 2015-03-18: 1000 ug via INTRAMUSCULAR

## 2015-03-18 MED ORDER — PREDNISONE 10 MG (21) PO TBPK: ORAL_TABLET | ORAL | Status: DC

## 2015-03-18 NOTE — Progress Notes (Signed)
  Subjective:    CC: Follow-up  HPI: Vitamin B-12 deficiency:  Initially had severe gait abnormality, we did note a severe vitamin B12 deficiency, folate deficiency, and iron deficiency with anemia. I have been aggressively treating him, and his gait and lower extremity sensation has improved significantly, he still has a bit of lower extremity numbness. He was seen by neurology, and nerve conduction studies with electromyography were centrally negative. 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  Skin rash: Hives, itching overall his skin. Did not respond to Benadryl or hydroxyzine.  Past medical history, Surgical history, Family history not pertinant except as noted below, Social history, Allergies, and medications have been entered into the medical record, reviewed, and no changes needed.   Review of Systems: No fevers, chills, night sweats, weight loss, chest pain, or shortness of breath.   Objective:    General: Well Developed, well nourished, and in no acute distress.  Neuro: Alert and oriented x3, extra-ocular muscles intact, sensation grossly intact. Gait is only minimally unsteady, improved significantly from prior. HEENT: Normocephalic, atraumatic, pupils equal round reactive to light, neck supple, no masses, no lymphadenopathy, thyroid nonpalpable.  Skin: Warm and dry, cutaneous rash consisting of hives over the arms, back. Cardiac: Regular rate and rhythm, no murmurs rubs or gallops, no lower extremity edema.  Respiratory: Clear to auscultation bilaterally. Not using accessory muscles, speaking in full sentences.  Impression and Recommendations:

## 2015-03-18 NOTE — Assessment & Plan Note (Signed)
Gait is significantly improving with B12 supplementation, he also had both iron deficiency and folate deficiency anemia. Rechecking all hematologic indices. I did explain that it could take months to years for sensation and gait to improve after B12 supplements.

## 2015-03-18 NOTE — Assessment & Plan Note (Signed)
Persistent despite Benadryl and hydroxyzine. He does have some hives on his arm. I'm going to add a prednisone taper, we are also going to check his blood work with a peripheral smear, there are some rare T-cell lymphomas that can present with cutaneous hives, primarily Sezary Syndrome.

## 2015-03-19 LAB — HOMOCYSTEINE: Homocysteine: 14.4 umol/L — ABNORMAL HIGH (ref <11.4)

## 2015-03-19 LAB — CBC
HCT: 31 % — ABNORMAL LOW (ref 39.0–52.0)
Hemoglobin: 9.3 g/dL — ABNORMAL LOW (ref 13.0–17.0)
MCH: 22.6 pg — ABNORMAL LOW (ref 26.0–34.0)
MCHC: 30 g/dL (ref 30.0–36.0)
MCV: 75.4 fL — ABNORMAL LOW (ref 78.0–100.0)
MPV: 10.1 fL (ref 8.6–12.4)
Platelets: 236 10*3/uL (ref 150–400)
RBC: 4.11 MIL/uL — ABNORMAL LOW (ref 4.22–5.81)
RDW: 20.9 % — ABNORMAL HIGH (ref 11.5–15.5)
WBC: 7.3 10*3/uL (ref 4.0–10.5)

## 2015-03-19 LAB — IRON AND TIBC
%SAT: 3 % — ABNORMAL LOW (ref 15–60)
Iron: 13 ug/dL — ABNORMAL LOW (ref 50–180)
TIBC: 418 ug/dL (ref 250–425)
UIBC: 405 ug/dL — ABNORMAL HIGH (ref 125–400)

## 2015-03-19 LAB — PATHOLOGIST SMEAR REVIEW

## 2015-03-19 LAB — VITAMIN B12: Vitamin B-12: >2000 pg/mL — ABNORMAL HIGH (ref 211–911)

## 2015-03-19 LAB — COMPREHENSIVE METABOLIC PANEL WITH GFR
ALT: 10 U/L (ref 9–46)
AST: 14 U/L (ref 10–35)
Albumin: 3.7 g/dL (ref 3.6–5.1)
Alkaline Phosphatase: 51 U/L (ref 40–115)
CO2: 22 mmol/L (ref 20–31)
Calcium: 9 mg/dL (ref 8.6–10.3)
Total Bilirubin: 0.3 mg/dL (ref 0.2–1.2)
Total Protein: 6.3 g/dL (ref 6.1–8.1)

## 2015-03-19 LAB — COMPREHENSIVE METABOLIC PANEL
BUN: 23 mg/dL (ref 7–25)
Chloride: 110 mmol/L (ref 98–110)
Creat: 1.48 mg/dL — ABNORMAL HIGH (ref 0.70–1.18)
Glucose, Bld: 85 mg/dL (ref 65–99)
Potassium: 5.1 mmol/L (ref 3.5–5.3)
Sodium: 141 mmol/L (ref 135–146)

## 2015-03-19 LAB — FERRITIN: Ferritin: 10 ng/mL — ABNORMAL LOW (ref 22–322)

## 2015-03-19 LAB — FOLATE: Folate: >20 ng/mL

## 2015-03-20 LAB — METHYLMALONIC ACID, SERUM: Methylmalonic Acid, Quant: 149 nmol/L (ref 87–318)

## 2015-03-21 LAB — PROTEIN ELECTROPHORESIS, SERUM, WITH REFLEX
Albumin ELP: 3.4 g/dL — ABNORMAL LOW (ref 3.8–4.8)
Alpha-1-Globulin: 0.3 g/dL (ref 0.2–0.3)
Alpha-2-Globulin: 0.8 g/dL (ref 0.5–0.9)
Beta 2: 0.4 g/dL (ref 0.2–0.5)
Beta Globulin: 0.5 g/dL (ref 0.4–0.6)
Gamma Globulin: 0.9 g/dL (ref 0.8–1.7)
Total Protein, Serum Electrophoresis: 6.3 g/dL (ref 6.1–8.1)

## 2015-03-21 LAB — IGG, IGA, IGM
IgA: 407 mg/dL — ABNORMAL HIGH (ref 68–379)
IgG (Immunoglobin G), Serum: 962 mg/dL (ref 650–1600)
IgM, Serum: 149 mg/dL (ref 41–251)

## 2015-03-21 LAB — IFE INTERPRETATION

## 2015-04-03 ENCOUNTER — Other Ambulatory Visit: Payer: Self-pay | Admitting: Sports Medicine

## 2015-04-03 ENCOUNTER — Other Ambulatory Visit: Payer: Self-pay | Admitting: Family Medicine

## 2015-04-10 ENCOUNTER — Encounter: Payer: Self-pay | Admitting: Sports Medicine

## 2015-04-15 ENCOUNTER — Encounter: Payer: Self-pay | Admitting: Sports Medicine

## 2015-04-15 ENCOUNTER — Ambulatory Visit: Payer: Medicare Other | Admitting: Sports Medicine

## 2015-04-15 DIAGNOSIS — D531 Other megaloblastic anemias, not elsewhere classified: Secondary | ICD-10-CM

## 2015-04-15 MED ORDER — PREDNISONE 10 MG (21) PO TBPK: ORAL_TABLET | ORAL | Status: DC

## 2015-04-15 NOTE — Progress Notes (Signed)
  Subjective:    CC: Follow-up  HPI: Multifactorial anemia: B12, folate, and iron deficiency, currently only doing iron sulfate twice a day, he is doing 5 mg of folate daily and a vitamin B12 shot. Overall continues to improve however still having some fatigue, gait continues to improve as we correct his vitamin B12 deficiency.  Past medical history, Surgical history, Family history not pertinant except as noted below, Social history, Allergies, and medications have been entered into the medical record, reviewed, and no changes needed.   Review of Systems: No fevers, chills, night sweats, weight loss, chest pain, or shortness of breath.   Objective:    General: Well Developed, well nourished, and in no acute distress.  Neuro: Alert and oriented x3, extra-ocular muscles intact, sensation grossly intact. Ambulatory with an improving gait, minimally wide-based but not shuffling. HEENT: Normocephalic, atraumatic, pupils equal round reactive to light, neck supple, no masses, no lymphadenopathy, thyroid nonpalpable.  Skin: Warm and dry, no rashes. Cardiac: Regular rate and rhythm, no murmurs rubs or gallops, no lower extremity edema.  Respiratory: Clear to auscultation bilaterally. Not using accessory muscles, speaking in full sentences.  Impression and Recommendations:   I spent 25 minutes with this patient, greater than 50% was face-to-face time counseling regarding the above diagnoses

## 2015-04-15 NOTE — Assessment & Plan Note (Signed)
Hemoglobin continues to increase slowly, he is only using iron sulfate twice a day, advised to increase to 3 times a day. He also had low vitamin B12 with subacute combined degeneration and an elevated methylmalonic acid level, slowly improving with parenteral B12 supplementation, no evidence of pernicious anemia. Lastly he also had a profoundly low folate level with elevated homocystine, currently doing 5 mg of folate daily. I think it can see him back in 2 months, and we can recheck his indices. Gait continues to improve significantly. Still has some widespread itching, suite going to do another burst of prednisone. GI workup was negative.

## 2015-04-16 MED ORDER — CYANOCOBALAMIN 1000 MCG/ML IJ SOLN
1000.0000 ug | Freq: Once | INTRAMUSCULAR | Status: AC
  Administered 2015-04-15: 1000 ug via INTRAMUSCULAR

## 2015-04-16 NOTE — Addendum Note (Signed)
Addended by: Baird KayUGLAS, Kelyn Koskela M on: 04/16/2015 08:20 AM   Modules accepted: Orders

## 2015-04-30 ENCOUNTER — Other Ambulatory Visit: Payer: Self-pay | Admitting: Family Medicine

## 2015-06-16 ENCOUNTER — Encounter: Payer: Self-pay | Admitting: Sports Medicine

## 2015-06-16 ENCOUNTER — Ambulatory Visit (INDEPENDENT_AMBULATORY_CARE_PROVIDER_SITE_OTHER): Payer: Medicare Other | Admitting: Sports Medicine

## 2015-06-16 VITALS — BP 125/80 | HR 70 | Temp 98.5°F | Resp 18 | Wt 201.3 lb

## 2015-06-16 DIAGNOSIS — R609 Edema, unspecified: Secondary | ICD-10-CM | POA: Diagnosis not present

## 2015-06-16 DIAGNOSIS — R600 Localized edema: Secondary | ICD-10-CM

## 2015-06-16 DIAGNOSIS — D531 Other megaloblastic anemias, not elsewhere classified: Secondary | ICD-10-CM | POA: Diagnosis not present

## 2015-06-16 MED ORDER — METOPROLOL SUCCINATE ER 25 MG PO TB24: 25.0000 mg | ORAL_TABLET | Freq: Every day | ORAL | Status: DC

## 2015-06-16 MED ORDER — VITAMIN B-12 1000 MCG PO TABS: 1000.0000 ug | ORAL_TABLET | Freq: Every day | ORAL | Status: AC

## 2015-06-16 MED ORDER — BENAZEPRIL HCL 20 MG PO TABS: 10.0000 mg | ORAL_TABLET | Freq: Every day | ORAL | Status: DC

## 2015-06-16 NOTE — Progress Notes (Signed)
  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  Itching: Improving  Hypertension: Doing well, needs refills on metoprolol and benazepril.  Past medical history, Surgical history, Family history not pertinant except as noted below, Social history, Allergies, and medications have been entered into the medical record, reviewed, and no changes needed.   Review of Systems: No fevers, chills, night sweats, weight loss, chest pain, or shortness of breath.   Objective:    General: Well Developed, well nourished, and in no acute distress.  Neuro: Alert and oriented x3, extra-ocular muscles intact, sensation grossly intact. Gait is significantly more stable. Was able to dance in the hallway. HEENT: Normocephalic, atraumatic, pupils equal round reactive to light, neck supple, no masses, no lymphadenopathy, thyroid nonpalpable.  Skin: Warm and dry, no rashes. Cardiac: Regular rate and rhythm, no murmurs rubs or gallops, no lower extremity edema.  Respiratory: Clear to auscultation bilaterally. Not using accessory muscles, speaking in full sentences.  Impression and Recommendations:

## 2015-06-16 NOTE — Assessment & Plan Note (Signed)
Gait has improved significantly. His hemoglobin was improving more slowly. We are going to check his blood work again including all iron indices, B12, folate, normal cystine, and methylmalonic acid levels. Continue folic acid 5 mg daily, switching to oral B12 supplementation, 1000 g daily, and continue iron sulfate 3 times a day. GI workup was negative for a gastrointestinal source of blood loss.

## 2015-06-17 LAB — CBC
HCT: 40.1 % (ref 39.0–52.0)
Hemoglobin: 13.3 g/dL (ref 13.0–17.0)
MCH: 28.4 pg (ref 26.0–34.0)
MCHC: 33.2 g/dL (ref 30.0–36.0)
MCV: 85.7 fL (ref 78.0–100.0)
MPV: 9.8 fL (ref 8.6–12.4)
Platelets: 192 10*3/uL (ref 150–400)
RBC: 4.68 MIL/uL (ref 4.22–5.81)
RDW: 19.5 % — ABNORMAL HIGH (ref 11.5–15.5)
WBC: 6.9 10*3/uL (ref 4.0–10.5)

## 2015-06-17 LAB — HOMOCYSTEINE: Homocysteine: 14.1 umol/L — ABNORMAL HIGH (ref <11.4)

## 2015-06-17 LAB — IRON AND TIBC
%SAT: 40 % (ref 15–60)
Iron: 142 ug/dL (ref 50–180)
TIBC: 353 ug/dL (ref 250–425)
UIBC: 211 ug/dL (ref 125–400)

## 2015-06-17 LAB — FERRITIN: Ferritin: 37 ng/mL (ref 22–322)

## 2015-06-17 LAB — FOLATE: Folate: >20 ng/mL

## 2015-06-17 LAB — VITAMIN B12: Vitamin B-12: 458 pg/mL (ref 211–911)

## 2015-06-19 LAB — METHYLMALONIC ACID, SERUM: Methylmalonic Acid, Quant: 125 nmol/L (ref 87–318)

## 2015-09-02 ENCOUNTER — Encounter: Payer: Self-pay | Admitting: Internal Medicine

## 2015-12-15 ENCOUNTER — Ambulatory Visit (INDEPENDENT_AMBULATORY_CARE_PROVIDER_SITE_OTHER): Payer: Medicare Other | Admitting: Sports Medicine

## 2015-12-15 ENCOUNTER — Encounter: Payer: Self-pay | Admitting: Sports Medicine

## 2015-12-15 VITALS — BP 160/88 | HR 58 | Resp 16 | Wt 197.3 lb

## 2015-12-15 DIAGNOSIS — D531 Other megaloblastic anemias, not elsewhere classified: Secondary | ICD-10-CM

## 2015-12-15 DIAGNOSIS — Z23 Encounter for immunization: Secondary | ICD-10-CM

## 2015-12-15 DIAGNOSIS — Z299 Encounter for prophylactic measures, unspecified: Secondary | ICD-10-CM

## 2015-12-15 DIAGNOSIS — R600 Localized edema: Secondary | ICD-10-CM

## 2015-12-15 DIAGNOSIS — R609 Edema, unspecified: Secondary | ICD-10-CM

## 2015-12-15 DIAGNOSIS — I10 Essential (primary) hypertension: Secondary | ICD-10-CM | POA: Diagnosis not present

## 2015-12-15 MED ORDER — BENAZEPRIL HCL 20 MG PO TABS: 20.0000 mg | ORAL_TABLET | Freq: Every day | ORAL | Status: DC

## 2015-12-15 NOTE — Assessment & Plan Note (Signed)
Increasing benazepril to full dose. Return in 2 weeks to recheck Vitals.

## 2015-12-15 NOTE — Assessment & Plan Note (Signed)
Rechecking B12, folate, CBC, methylmalonic acid, homocysteine levels. Continues with iron, folate, B12 supplementation.

## 2015-12-15 NOTE — Assessment & Plan Note (Signed)
Pneumococcal 23 needed.

## 2015-12-15 NOTE — Progress Notes (Signed)
  Subjective:    CC: Follow-up  HPI: Multifactorial anemia: Doing well  Hypertension: Elevated today. Currently doing one half tab of benazepril.  Preventative measures: Due for pneumococcal vaccine 23 #2  Past medical history, Surgical history, Family history not pertinant except as noted below, Social history, Allergies, and medications have been entered into the medical record, reviewed, and no changes needed.   Review of Systems: No fevers, chills, night sweats, weight loss, chest pain, or shortness of breath.   Objective:    General: Well Developed, well nourished, and in no acute distress.  Neuro: Alert and oriented x3, extra-ocular muscles intact, sensation grossly intact.  HEENT: Normocephalic, atraumatic, pupils equal round reactive to light, neck supple, no masses, no lymphadenopathy, thyroid nonpalpable.  Skin: Warm and dry, no rashes. Cardiac: Regular rate and rhythm, no murmurs rubs or gallops, no lower extremity edema.  Respiratory: Clear to auscultation bilaterally. Not using accessory muscles, speaking in full sentences.  Impression and Recommendations:   I spent 25 minutes with this patient, greater than 50% was face-to-face time counseling regarding the above diagnoses

## 2015-12-16 LAB — CBC
HCT: 44.2 % (ref 38.5–50.0)
Hemoglobin: 14.7 g/dL (ref 13.2–17.1)
MCH: 32 pg (ref 27.0–33.0)
MCHC: 33.3 g/dL (ref 32.0–36.0)
MCV: 96.1 fL (ref 80.0–100.0)
MPV: 10.2 fL (ref 7.5–12.5)
Platelets: 161 K/uL (ref 140–400)
RBC: 4.6 MIL/uL (ref 4.20–5.80)
RDW: 13.8 % (ref 11.0–15.0)
WBC: 5.7 10*3/uL (ref 3.8–10.8)

## 2015-12-16 LAB — COMPREHENSIVE METABOLIC PANEL WITH GFR
AST: 14 U/L (ref 10–35)
Albumin: 4.1 g/dL (ref 3.6–5.1)
BUN: 16 mg/dL (ref 7–25)
Chloride: 103 mmol/L (ref 98–110)
Creat: 1.63 mg/dL — ABNORMAL HIGH (ref 0.70–1.18)
Sodium: 139 mmol/L (ref 135–146)
Total Bilirubin: 0.5 mg/dL (ref 0.2–1.2)
Total Protein: 6.7 g/dL (ref 6.1–8.1)

## 2015-12-16 LAB — IRON AND TIBC
%SAT: 44 % (ref 15–60)
Iron: 160 ug/dL (ref 50–180)
TIBC: 362 ug/dL (ref 250–425)
UIBC: 202 ug/dL (ref 125–400)

## 2015-12-16 LAB — VITAMIN B12: Vitamin B-12: 928 pg/mL (ref 200–1100)

## 2015-12-16 LAB — COMPREHENSIVE METABOLIC PANEL
ALT: 10 U/L (ref 9–46)
Alkaline Phosphatase: 43 U/L (ref 40–115)
CO2: 25 mmol/L (ref 20–31)
Calcium: 9.4 mg/dL (ref 8.6–10.3)
Glucose, Bld: 85 mg/dL (ref 65–99)
Potassium: 4.9 mmol/L (ref 3.5–5.3)

## 2015-12-16 LAB — FOLATE: Folate: >24 ng/mL (ref >5.4)

## 2015-12-16 LAB — FERRITIN: Ferritin: 64 ng/mL (ref 20–380)

## 2015-12-29 ENCOUNTER — Ambulatory Visit (INDEPENDENT_AMBULATORY_CARE_PROVIDER_SITE_OTHER): Payer: Medicare Other | Admitting: Sports Medicine

## 2015-12-29 ENCOUNTER — Encounter: Payer: Self-pay | Admitting: Sports Medicine

## 2015-12-29 DIAGNOSIS — I10 Essential (primary) hypertension: Secondary | ICD-10-CM | POA: Diagnosis not present

## 2015-12-29 MED ORDER — VALSARTAN-HYDROCHLOROTHIAZIDE 320-25 MG PO TABS: 0.5000 | ORAL_TABLET | Freq: Every day | ORAL | 3 refills | Status: DC

## 2015-12-29 NOTE — Assessment & Plan Note (Signed)
Persistently elevated blood pressure, switching to valsartan/HCTZ one half tab. Return to see me in 3 weeks to recheck blood pressure.

## 2015-12-29 NOTE — Progress Notes (Signed)
  Subjective:    CC: Follow-up  HPI: Hypertension: Continues to be elevated on current medications. No headaches, visual changes, chest pain. We did increase his benazepril at the last visit, continues with metoprolol.  Past medical history, Surgical history, Family history not pertinant except as noted below, Social history, Allergies, and medications have been entered into the medical record, reviewed, and no changes needed.   Review of Systems: No fevers, chills, night sweats, weight loss, chest pain, or shortness of breath.   Objective:    General: Well Developed, well nourished, and in no acute distress.  Neuro: Alert and oriented x3, extra-ocular muscles intact, sensation grossly intact.  HEENT: Normocephalic, atraumatic, pupils equal round reactive to light, neck supple, no masses, no lymphadenopathy, thyroid nonpalpable.  Skin: Warm and dry, no rashes. Cardiac: Regular rate and rhythm, no murmurs rubs or gallops, no lower extremity edema.  Respiratory: Clear to auscultation bilaterally. Not using accessory muscles, speaking in full sentences.  Impression and Recommendations:

## 2016-01-19 ENCOUNTER — Ambulatory Visit (INDEPENDENT_AMBULATORY_CARE_PROVIDER_SITE_OTHER): Payer: Medicare Other | Admitting: Sports Medicine

## 2016-01-19 DIAGNOSIS — L568 Other specified acute skin changes due to ultraviolet radiation: Secondary | ICD-10-CM | POA: Diagnosis not present

## 2016-01-19 DIAGNOSIS — I10 Essential (primary) hypertension: Secondary | ICD-10-CM | POA: Diagnosis not present

## 2016-01-19 MED ORDER — VALSARTAN-HYDROCHLOROTHIAZIDE 320-25 MG PO TABS: 1.0000 | ORAL_TABLET | Freq: Every day | ORAL | 3 refills | Status: DC

## 2016-01-19 NOTE — Progress Notes (Signed)
  Subjective:    CC:  Follow-up  HPI: Skin rash: Persistent, is predominantly on the sun exposed areas with pruritus, erythema. We've tried topical steroids, he's been only minimally compliant with using sunscreen, unfortunately has persistent and worsening rash.  Hypertension: Improved but ill elevated, only taking one half tab of valsartan/HCTZ  Past medical history, Surgical history, Family history not pertinant except as noted below, Social history, Allergies, and medications have been entered into the medical record, reviewed, and no changes needed.   Review of Systems: No fevers, chills, night sweats, weight loss, chest pain, or shortness of breath.   Objective:    General: Well Developed, well nourished, and in no acute distress.  Neuro: Alert and oriented x3, extra-ocular muscles intact, sensation grossly intact.  HEENT: Normocephalic, atraumatic, pupils equal round reactive to light, neck supple, no masses, no lymphadenopathy, thyroid nonpalpable.  Skin: Warm and dry,  Diffusely erythematous over the arms, legs, top of the head. Cardiac: Regular rate and rhythm, no murmurs rubs or gallops, no lower extremity edema.  Respiratory: Clear to auscultation bilaterally. Not using accessory muscles, speaking in full sentences.  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 Rashes widespread and worsening on what appear to be some exposed areas. At this point I'd like him to touch base with dermatology.  Essential hypertension, benign Improved but still elevated, increasing to 1 full tab daily of valsartan/HCTZ, return to see me in one week.  I spent 25 minutes with this patient, greater than 50% was face-to-face time counseling regarding the above diagnoses

## 2016-01-19 NOTE — Assessment & Plan Note (Signed)
Improved but still elevated, increasing to 1 full tab daily of valsartan/HCTZ, return to see me in one week.

## 2016-01-19 NOTE — Assessment & Plan Note (Signed)
We have tried multiple steroid creams topically, he is minimally diligent with applying sunscreen. Rashes widespread and worsening on what appear to be some exposed areas. At this point I'd like him to touch base with dermatology.

## 2016-01-26 ENCOUNTER — Ambulatory Visit (INDEPENDENT_AMBULATORY_CARE_PROVIDER_SITE_OTHER): Payer: Medicare Other | Admitting: Sports Medicine

## 2016-01-26 ENCOUNTER — Encounter: Payer: Self-pay | Admitting: Sports Medicine

## 2016-01-26 DIAGNOSIS — I10 Essential (primary) hypertension: Secondary | ICD-10-CM

## 2016-01-26 MED ORDER — AMLODIPINE BESYLATE 10 MG PO TABS: 5.0000 mg | ORAL_TABLET | Freq: Every day | ORAL | 3 refills | Status: DC

## 2016-01-26 NOTE — Assessment & Plan Note (Signed)
Still elevated, continue metoprolol and valsartan/HCTZ. Adding amlodipine one half tab, return in 2 weeks.

## 2016-01-26 NOTE — Progress Notes (Signed)
  Subjective:    CC: Follow-up  HPI: Essential hypertension: We did increase to a full tab of valsartan/hydrochlorothiazide, he continues with metoprolol 25 mg. Unfortunately blood pressure is still elevated.  Past medical history, Surgical history, Family history not pertinant except as noted below, Social history, Allergies, and medications have been entered into the medical record, reviewed, and no changes needed.   Review of Systems: No fevers, chills, night sweats, weight loss, chest pain, or shortness of breath.   Objective:    General: Well Developed, well nourished, and in no acute distress.  Neuro: Alert and oriented x3, extra-ocular muscles intact, sensation grossly intact.  HEENT: Normocephalic, atraumatic, pupils equal round reactive to light, neck supple, no masses, no lymphadenopathy, thyroid nonpalpable.  Skin: Warm and dry, no rashes. Cardiac: Regular rate and rhythm, no murmurs rubs or gallops, no lower extremity edema.  Respiratory: Clear to auscultation bilaterally. Not using accessory muscles, speaking in full sentences.  Impression and Recommendations:    Essential hypertension, benign Still elevated, continue metoprolol and valsartan/HCTZ. Adding amlodipine one half tab, return in 2 weeks.

## 2016-01-30 DIAGNOSIS — L562 Photocontact dermatitis [berloque dermatitis]: Secondary | ICD-10-CM | POA: Diagnosis not present

## 2016-02-06 DIAGNOSIS — L562 Photocontact dermatitis [berloque dermatitis]: Secondary | ICD-10-CM | POA: Diagnosis not present

## 2016-02-10 ENCOUNTER — Ambulatory Visit (INDEPENDENT_AMBULATORY_CARE_PROVIDER_SITE_OTHER): Payer: Medicare Other | Admitting: Sports Medicine

## 2016-02-10 DIAGNOSIS — I10 Essential (primary) hypertension: Secondary | ICD-10-CM | POA: Diagnosis not present

## 2016-02-10 NOTE — Progress Notes (Signed)
  Subjective:    CC:   Follow-up  HPI: This is a pleasant 75 year old male who is here for follow-up of his hypertension, he is doing full tablet valsartan/hydrochlorothiazide and a half tab of amlodipine, doing extremely well without any episodes of hypotension. No orthostatic symptoms. No headaches, visual changes, chest pain.  Skin rash: Confirmed photodermatitis with dermatology, he is on several creams and some more prednisone. Also advice was given to cover his skin when outside.  Past medical history, Surgical history, Family history not pertinant except as noted below, Social history, Allergies, and medications have been entered into the medical record, reviewed, and no changes needed.   Review of Systems: No fevers, chills, night sweats, weight loss, chest pain, or shortness of breath.   Objective:    General: Well Developed, well nourished, and in no acute distress.  Neuro: Alert and oriented x3, extra-ocular muscles intact, sensation grossly intact.  HEENT: Normocephalic, atraumatic, pupils equal round reactive to light, neck supple, no masses, no lymphadenopathy, thyroid nonpalpable.  Skin: Warm and dry, no rashes. Cardiac: Regular rate and rhythm, no murmurs rubs or gallops, no lower extremity edema.  Respiratory: Clear to auscultation bilaterally. Not using accessory muscles, speaking in full sentences.  Impression and Recommendations:    Essential hypertension, benign Well-controlled, no changes.

## 2016-02-10 NOTE — Assessment & Plan Note (Signed)
Well controlled, no changes 

## 2016-03-09 DIAGNOSIS — H2513 Age-related nuclear cataract, bilateral: Secondary | ICD-10-CM | POA: Diagnosis not present

## 2016-05-30 ENCOUNTER — Other Ambulatory Visit: Payer: Self-pay | Admitting: Sports Medicine

## 2016-05-30 DIAGNOSIS — I10 Essential (primary) hypertension: Secondary | ICD-10-CM

## 2016-07-21 ENCOUNTER — Other Ambulatory Visit: Payer: Self-pay | Admitting: Sports Medicine

## 2016-07-22 ENCOUNTER — Other Ambulatory Visit: Payer: Self-pay | Admitting: Sports Medicine

## 2016-07-22 DIAGNOSIS — I10 Essential (primary) hypertension: Secondary | ICD-10-CM

## 2016-08-09 ENCOUNTER — Ambulatory Visit (INDEPENDENT_AMBULATORY_CARE_PROVIDER_SITE_OTHER): Payer: Medicare Other | Admitting: Sports Medicine

## 2016-08-09 ENCOUNTER — Encounter: Payer: Self-pay | Admitting: Sports Medicine

## 2016-08-09 DIAGNOSIS — I10 Essential (primary) hypertension: Secondary | ICD-10-CM

## 2016-08-09 MED ORDER — VALSARTAN-HYDROCHLOROTHIAZIDE 320-25 MG PO TABS: 1.0000 | ORAL_TABLET | Freq: Every day | ORAL | 0 refills | Status: DC

## 2016-08-09 MED ORDER — AMLODIPINE BESYLATE 10 MG PO TABS: 10.0000 mg | ORAL_TABLET | Freq: Every day | ORAL | 3 refills | Status: DC

## 2016-08-09 NOTE — Progress Notes (Signed)
  Subjective:    CC: Follow-up  HPI: Hypertension: History has been well-controlled on valsartan/HCTZ full dose and 5 mg of amlodipine, tells me he did have a lot of coffee today, now has a headache, with pressure is elevated. No chest pain, visual changes.  Past medical history:  Negative.  See flowsheet/record as well for more information.  Surgical history: Negative.  See flowsheet/record as well for more information.  Family history: Negative.  See flowsheet/record as well for more information.  Social history: Negative.  See flowsheet/record as well for more information.  Allergies, and medications have been entered into the medical record, reviewed, and no changes needed.   Review of Systems: No fevers, chills, night sweats, weight loss, chest pain, or shortness of breath.   Objective:    General: Well Developed, well nourished, and in no acute distress.  Neuro: Alert and oriented x3, extra-ocular muscles intact, sensation grossly intact.  HEENT: Normocephalic, atraumatic, pupils equal round reactive to light, neck supple, no masses, no lymphadenopathy, thyroid nonpalpable.  Skin: Warm and dry, no rashes. Cardiac: Regular rate and rhythm, no murmurs rubs or gallops, no lower extremity edema.  Respiratory: Clear to auscultation bilaterally. Not using accessory muscles, speaking in full sentences.  Impression and Recommendations:    Essential hypertension, benign Blood pressure is elevated today however he did drink a lot of coffee before coming in. Continue valsartan/HCTZ full dose, increasing amlodipine to 10 mg, return in 2 weeks to recheck blood pressure.  I spent 25 minutes with this patient, greater than 50% was face-to-face time counseling regarding the above diagnoses

## 2016-08-09 NOTE — Assessment & Plan Note (Addendum)
Blood pressure is elevated today however he did drink a lot of coffee before coming in. Continue valsartan/HCTZ full dose, increasing amlodipine to 10 mg, return in 2 weeks to recheck blood pressure.

## 2016-08-23 ENCOUNTER — Encounter: Payer: Self-pay | Admitting: Sports Medicine

## 2016-08-23 ENCOUNTER — Ambulatory Visit (INDEPENDENT_AMBULATORY_CARE_PROVIDER_SITE_OTHER): Payer: Medicare Other | Admitting: Sports Medicine

## 2016-08-23 DIAGNOSIS — I10 Essential (primary) hypertension: Secondary | ICD-10-CM | POA: Diagnosis not present

## 2016-08-23 DIAGNOSIS — H1013 Acute atopic conjunctivitis, bilateral: Secondary | ICD-10-CM

## 2016-08-23 DIAGNOSIS — H1010 Acute atopic conjunctivitis, unspecified eye: Secondary | ICD-10-CM | POA: Insufficient documentation

## 2016-08-23 DIAGNOSIS — I5032 Chronic diastolic (congestive) heart failure: Secondary | ICD-10-CM

## 2016-08-23 MED ORDER — AZELASTINE HCL 0.05 % OP SOLN: 2.0000 [drp] | Freq: Two times a day (BID) | OPHTHALMIC | 11 refills | Status: DC

## 2016-08-23 NOTE — Assessment & Plan Note (Signed)
Adding cardiac event monitoring, 72 hours, getting occasional episodes of flutter behind his chest. It's unclear as to whether this represents fasciculations or palpitations.

## 2016-08-23 NOTE — Assessment & Plan Note (Signed)
Well controlled, no changes 

## 2016-08-23 NOTE — Progress Notes (Signed)
  Subjective:    CC: blood pressure follow-up  HPI: 76 y.o. Male with history of benign hypertension presents for two week follow-up. Pt reports that he has been taking medication as recommended and denies major side effects, but does complain of headache and dizziness that he does not necessarily associate with medication use. Notably, dizziness occurs upon standing.   Patient denies that headache and dizziness are associated. Rates the pain as 6/10. Patient reports concurrent blurry vision when headache comes on. Denies sensitivity to light and loud noises. Reports that headaches occur around this time of year.   Patient also complains of "flutter" in right side of chest. Unclear if flutter refers to muscle fasciculations or heart palpitations, but patient reported the flutter was similar to a "flutter" in his leg.  Past medical history:  Benign hypertension.  See flowsheet/record as well for more information.  Surgical history: Negative.  See flowsheet/record as well for more information.  Family history: Negative.  See flowsheet/record as well for more information.  Social history: Retired, doesn't get up and around as much as he used to.  See flowsheet/record as well for more information.  Allergies, and medications have been entered into the medical record, reviewed, and no changes needed.   Review of Systems: No fevers, chills, night sweats, weight loss, chest pain, or shortness of breath.   Objective:    General: Well Developed, well nourished, and in no acute distress.  Neuro: Alert and oriented x3, extra-ocular muscles intact, sensation grossly intact.  HEENT: Normocephalic, atraumatic, pupils equal round reactive to light, neck supple, no masses, no lymphadenopathy, thyroid nonpalpable.  Skin: Warm and dry, no rashes. Cardiac: Regular rate and rhythm, no murmurs rubs or gallops, no lower extremity edema.  Respiratory: Clear to auscultation bilaterally. Not using accessory  muscles, speaking in full sentences.   Impression and Recommendations:    76 y.o. Male presents for two-week follow-up of benign essential hypertension. Pt reports he is taking medication as directed, and BP of 118/67 reflects this. There is some concern for orthostasis, as pt reports dizziness on standing, which we will keep an eye on.   Headache is most likely the result of allergic rhinitis given its association with blurry vision and time of year. Symptoms inconsistent with migraine. Pt prescribed Optivar for symptom management, and was advised to try to limit time staring at a screen.   Unclear if "flutter" in right side of chest was due to muscle fasciculation or heart palpitation. Pt did compare the sensation to a "flutter" in his leg, but his history is also positive for carotid artery occlusion, so we should follow with a holter monitor.

## 2016-08-23 NOTE — Assessment & Plan Note (Signed)
Adding Optivar.  

## 2016-09-20 ENCOUNTER — Ambulatory Visit: Payer: Medicare Other | Admitting: Sports Medicine

## 2016-09-23 ENCOUNTER — Encounter: Payer: Self-pay | Admitting: *Deleted

## 2016-09-23 ENCOUNTER — Other Ambulatory Visit: Payer: Self-pay | Admitting: Sports Medicine

## 2016-09-23 ENCOUNTER — Ambulatory Visit: Payer: Medicare Other

## 2016-09-23 DIAGNOSIS — I5032 Chronic diastolic (congestive) heart failure: Secondary | ICD-10-CM

## 2016-09-23 DIAGNOSIS — R42 Dizziness and giddiness: Secondary | ICD-10-CM

## 2016-09-23 DIAGNOSIS — I498 Other specified cardiac arrhythmias: Secondary | ICD-10-CM

## 2016-09-23 NOTE — Progress Notes (Signed)
Patient ID: [name]., male   DOB: 1940-11-27, 76 y.o.   MRN: [id number] Patient arrived today to have a cardiac event monitor applied.   Patient was concerned about out of pocket costs.  Since cardiac event monitor service are provided by private companies, I was not able to provide the patient with this information.  Patient was given the names and phone numbers of the two cardiac event monitor companies that we use so he can call for a quote.  Patient will call to reschedule if costs are acceptable.  He was informed to specify which company he would like to use when calling for an appointment.

## 2016-09-24 ENCOUNTER — Telehealth: Payer: Self-pay | Admitting: Sports Medicine

## 2016-09-24 NOTE — Telephone Encounter (Signed)
I have reached out to patient several times to get him scheduled for the monitor appt with no success. I have removed the order from the workque.  Please see notes below.  09/24/2016 LMOM for pt to call and schedule monitor appt. stpegram 09/06/2016 LMOM for pt to call and schedule his monitor appt. stpegram 08/24/2016 LMOM for pt to call and schedule monitor appt. stpegram  Lamar Laundry

## 2016-09-27 ENCOUNTER — Ambulatory Visit: Payer: Medicare Other | Admitting: Sports Medicine

## 2016-10-05 ENCOUNTER — Encounter: Payer: Self-pay | Admitting: Sports Medicine

## 2016-10-05 ENCOUNTER — Ambulatory Visit (INDEPENDENT_AMBULATORY_CARE_PROVIDER_SITE_OTHER): Payer: Medicare Other | Admitting: Sports Medicine

## 2016-10-05 DIAGNOSIS — I10 Essential (primary) hypertension: Secondary | ICD-10-CM

## 2016-10-05 DIAGNOSIS — I5032 Chronic diastolic (congestive) heart failure: Secondary | ICD-10-CM

## 2016-10-05 NOTE — Assessment & Plan Note (Signed)
Episodes of palpitations have resolved. Discontinue order for event monitoring.

## 2016-10-05 NOTE — Assessment & Plan Note (Signed)
Was having episodes of orthostasis. Decrease valsartan/HCTZ to one half tab daily, as the weather warms up he will get dehydrated.

## 2016-10-05 NOTE — Progress Notes (Signed)
  Subjective:    CC:  Follow-up  HPI: This is a pleasant 76 year old male, we've been treating him aggressively for his hypertension. Unfortunately he has developed some orthostatic symptoms. More recently he did have a fall but didn't get hurt.  Palpitations: Resolved on the round. Never actually ended up getting his Holter monitor.  Past medical history:  Negative.  See flowsheet/record as well for more information.  Surgical history: Negative.  See flowsheet/record as well for more information.  Family history: Negative.  See flowsheet/record as well for more information.  Social history: Negative.  See flowsheet/record as well for more information.  Allergies, and medications have been entered into the medical record, reviewed, and no changes needed.   Review of Systems: No fevers, chills, night sweats, weight loss, chest pain, or shortness of breath.   Objective:    General: Well Developed, well nourished, and in no acute distress.  Neuro: Alert and oriented x3, extra-ocular muscles intact, sensation grossly intact.  HEENT: Normocephalic, atraumatic, pupils equal round reactive to light, neck supple, no masses, no lymphadenopathy, thyroid nonpalpable.  Skin: Warm and dry, no rashes. Cardiac: Regular rate and rhythm, no murmurs rubs or gallops, Only mild 1+ lower extremity pitting edema.  Respiratory: Clear to auscultation bilaterally. Not using accessory muscles, speaking in full sentences.  Impression and Recommendations:    Essential hypertension, benign Was having episodes of orthostasis. Decrease valsartan/HCTZ to one half tab daily, as the weather warms up he will get dehydrated.   Chronic diastolic heart failure Episodes of palpitations have resolved. Discontinue order for event monitoring.  I spent 25 minutes with this patient, greater than 50% was face-to-face time counseling regarding the above diagnoses

## 2016-11-02 ENCOUNTER — Ambulatory Visit (INDEPENDENT_AMBULATORY_CARE_PROVIDER_SITE_OTHER): Payer: Medicare Other | Admitting: Sports Medicine

## 2016-11-02 ENCOUNTER — Encounter: Payer: Self-pay | Admitting: Sports Medicine

## 2016-11-02 DIAGNOSIS — I10 Essential (primary) hypertension: Secondary | ICD-10-CM | POA: Diagnosis not present

## 2016-11-02 MED ORDER — METOPROLOL SUCCINATE ER 25 MG PO TB24: ORAL_TABLET | ORAL | 3 refills | Status: DC

## 2016-11-02 MED ORDER — AMLODIPINE BESYLATE 5 MG PO TABS: 5.0000 mg | ORAL_TABLET | Freq: Every day | ORAL | 3 refills | Status: DC

## 2016-11-02 NOTE — Assessment & Plan Note (Signed)
Unfortunately has been out of metoprolol, refilling. Continue valsartan/HCTZ at one half tab daily, decreasing amlodipine down to 5 mg. Return in 2 weeks. He was very concerned about his leg swelling and agrees to wear compression hose, this time he will try to put them over some long socks.

## 2016-11-02 NOTE — Progress Notes (Signed)
  Subjective:    CC: Follow-up  HPI: Kendell Baneroy is back to discuss his orthostatic hypotension, he decreased his valsartan/HCTZ in half at the last visit, blood pressure remained the same, but he still has some episodes of orthostasis. Also with significant leg swelling. He missed some doses of metoprolol, he continues to take amlodipine 10 mg daily. Symptoms are moderate, persistent.  Past medical history:  Negative.  See flowsheet/record as well for more information.  Surgical history: Negative.  See flowsheet/record as well for more information.  Family history: Negative.  See flowsheet/record as well for more information.  Social history: Negative.  See flowsheet/record as well for more information.  Allergies, and medications have been entered into the medical record, reviewed, and no changes needed.   Review of Systems: No fevers, chills, night sweats, weight loss, chest pain, or shortness of breath.   Objective:    General: Well Developed, well nourished, and in no acute distress.  Neuro: Alert and oriented x3, extra-ocular muscles intact, sensation grossly intact.  HEENT: Normocephalic, atraumatic, pupils equal round reactive to light, neck supple, no masses, no lymphadenopathy, thyroid nonpalpable.  Skin: Warm and dry, no rashes. Cardiac: Regular rate and rhythm, no murmurs rubs or gallops, 2+ bilateral symmetric lower extremity edema with negative bilateral Homans signs. Respiratory: Clear to auscultation bilaterally. Not using accessory muscles, speaking in full sentences.  Impression and Recommendations:    Essential hypertension, benign Unfortunately has been out of metoprolol, refilling. Continue valsartan/HCTZ at one half tab daily, decreasing amlodipine down to 5 mg. Return in 2 weeks. He was very concerned about his leg swelling and agrees to wear compression hose, this time he will try to put them over some long socks.  I spent 25 minutes with this patient, greater than  50% was face-to-face time counseling regarding the above diagnoses

## 2016-11-16 ENCOUNTER — Ambulatory Visit (INDEPENDENT_AMBULATORY_CARE_PROVIDER_SITE_OTHER): Payer: Medicare Other | Admitting: Sports Medicine

## 2016-11-16 ENCOUNTER — Encounter: Payer: Self-pay | Admitting: Sports Medicine

## 2016-11-16 DIAGNOSIS — D531 Other megaloblastic anemias, not elsewhere classified: Secondary | ICD-10-CM | POA: Diagnosis not present

## 2016-11-16 DIAGNOSIS — I10 Essential (primary) hypertension: Secondary | ICD-10-CM | POA: Diagnosis not present

## 2016-11-16 LAB — CBC
HCT: 43.8 % (ref 38.5–50.0)
Hemoglobin: 14.4 g/dL (ref 13.2–17.1)
MCH: 32.2 pg (ref 27.0–33.0)
MCHC: 32.9 g/dL (ref 32.0–36.0)
MCV: 98 fL (ref 80.0–100.0)
MPV: 10.6 fL (ref 7.5–12.5)
Platelets: 185 K/uL (ref 140–400)
RBC: 4.47 MIL/uL (ref 4.20–5.80)
RDW: 14.2 % (ref 11.0–15.0)
WBC: 7.6 10*3/uL (ref 3.8–10.8)

## 2016-11-16 NOTE — Progress Notes (Signed)
  Subjective:    CC: Follow-up  HPI: Hypertension: Well controlled with cutting back antihypertensives, still gets occasional unsteadiness with standing. He is only wearing his compression hose at night.  Multiple vitamin deficiencies: Due to recheck.  Past medical history:  Negative.  See flowsheet/record as well for more information.  Surgical history: Negative.  See flowsheet/record as well for more information.  Family history: Negative.  See flowsheet/record as well for more information.  Social history: Negative.  See flowsheet/record as well for more information.  Allergies, and medications have been entered into the medical record, reviewed, and no changes needed.   Review of Systems: No fevers, chills, night sweats, weight loss, chest pain, or shortness of breath.   Objective:    General: Well Developed, well nourished, and in no acute distress.  Neuro: Alert and oriented x3, extra-ocular muscles intact, sensation grossly intact.  HEENT: Normocephalic, atraumatic, pupils equal round reactive to light, neck supple, no masses, no lymphadenopathy, thyroid nonpalpable.  Skin: Warm and dry, no rashes. Cardiac: Regular rate and rhythm, no murmurs rubs or gallops, Fantastic improvements with only trace lower extremity edema. Respiratory: Clear to auscultation bilaterally. Not using accessory muscles, speaking in full sentences.  Impression and Recommendations:    Essential hypertension, benign Well-controlled, still has some dizziness with standing. We did discuss adding compression hose during the day. I do think some of his dizziness may be coming from his B12 and folate deficiencies.  Combined B12, iron, and folate deficiency anemia Rechecking B12, folate, anemia panel.  I spent 25 minutes with this patient, greater than 50% was face-to-face time counseling regarding the above diagnoses

## 2016-11-16 NOTE — Assessment & Plan Note (Signed)
Well-controlled, still has some dizziness with standing. We did discuss adding compression hose during the day. I do think some of his dizziness may be coming from his B12 and folate deficiencies.

## 2016-11-16 NOTE — Assessment & Plan Note (Signed)
Rechecking B12, folate, anemia panel.

## 2016-11-17 LAB — IRON AND TIBC
%SAT: 44 % (ref 15–60)
Iron: 174 ug/dL (ref 50–180)
TIBC: 394 ug/dL (ref 250–425)
UIBC: 220 ug/dL

## 2016-11-17 LAB — FOLATE: Folate: >24 ng/mL (ref >5.4)

## 2016-11-17 LAB — VITAMIN B12: Vitamin B-12: 429 pg/mL (ref 200–1100)

## 2016-11-17 LAB — FERRITIN: Ferritin: 66 ng/mL (ref 20–380)

## 2017-01-04 ENCOUNTER — Other Ambulatory Visit: Payer: Self-pay

## 2017-02-22 ENCOUNTER — Ambulatory Visit: Payer: Medicare Other | Admitting: Sports Medicine

## 2017-07-31 ENCOUNTER — Encounter (HOSPITAL_COMMUNITY): Payer: Self-pay | Admitting: *Deleted

## 2017-07-31 ENCOUNTER — Ambulatory Visit (HOSPITAL_COMMUNITY)
Admission: EM | Admit: 2017-07-31 | Discharge: 2017-07-31 | Disposition: A | Discharge status: Home / Self Care | Payer: Medicare Other | Attending: Family Medicine | Admitting: Family Medicine

## 2017-07-31 ENCOUNTER — Other Ambulatory Visit: Payer: Self-pay

## 2017-07-31 DIAGNOSIS — I110 Hypertensive heart disease with heart failure: Secondary | ICD-10-CM | POA: Diagnosis not present

## 2017-07-31 DIAGNOSIS — I5032 Chronic diastolic (congestive) heart failure: Secondary | ICD-10-CM | POA: Diagnosis not present

## 2017-07-31 DIAGNOSIS — M79671 Pain in right foot: Secondary | ICD-10-CM | POA: Insufficient documentation

## 2017-07-31 DIAGNOSIS — L608 Other nail disorders: Secondary | ICD-10-CM

## 2017-07-31 DIAGNOSIS — Z79899 Other long term (current) drug therapy: Secondary | ICD-10-CM | POA: Diagnosis not present

## 2017-07-31 DIAGNOSIS — Z8249 Family history of ischemic heart disease and other diseases of the circulatory system: Secondary | ICD-10-CM | POA: Diagnosis not present

## 2017-07-31 DIAGNOSIS — D509 Iron deficiency anemia, unspecified: Secondary | ICD-10-CM | POA: Insufficient documentation

## 2017-07-31 DIAGNOSIS — D519 Vitamin B12 deficiency anemia, unspecified: Secondary | ICD-10-CM | POA: Diagnosis not present

## 2017-07-31 DIAGNOSIS — Z86010 Personal history of colonic polyps: Secondary | ICD-10-CM | POA: Diagnosis not present

## 2017-07-31 DIAGNOSIS — Z820 Family history of epilepsy and other diseases of the nervous system: Secondary | ICD-10-CM | POA: Diagnosis not present

## 2017-07-31 DIAGNOSIS — E785 Hyperlipidemia, unspecified: Secondary | ICD-10-CM | POA: Diagnosis not present

## 2017-07-31 DIAGNOSIS — Z87891 Personal history of nicotine dependence: Secondary | ICD-10-CM | POA: Insufficient documentation

## 2017-07-31 DIAGNOSIS — M5136 Other intervertebral disc degeneration, lumbar region: Secondary | ICD-10-CM | POA: Insufficient documentation

## 2017-07-31 LAB — POCT I-STAT, CHEM 8
BUN: 22 mg/dL — AB (ref 6–20)
CHLORIDE: 103 mmol/L (ref 101–111)
Calcium, Ion: 1.15 mmol/L (ref 1.15–1.40)
Creatinine, Ser: 1.6 mg/dL — ABNORMAL HIGH (ref 0.61–1.24)
Glucose, Bld: 92 mg/dL (ref 65–99)
HEMATOCRIT: 46 % (ref 39.0–52.0)
Hemoglobin: 15.6 g/dL (ref 13.0–17.0)
POTASSIUM: 4.6 mmol/L (ref 3.5–5.1)
SODIUM: 139 mmol/L (ref 135–145)
TCO2: 26 mmol/L (ref 22–32)

## 2017-07-31 LAB — URIC ACID: URIC ACID, SERUM: 7.6 mg/dL (ref 4.4–7.6)

## 2017-07-31 MED ORDER — CEPHALEXIN 500 MG PO CAPS: 500.0000 mg | ORAL_CAPSULE | Freq: Three times a day (TID) | ORAL | 0 refills | Status: AC

## 2017-07-31 MED ORDER — TERBINAFINE HCL 250 MG PO TABS: 250.0000 mg | ORAL_TABLET | Freq: Every day | ORAL | 0 refills | Status: AC

## 2017-07-31 NOTE — ED Triage Notes (Addendum)
Rt foot pain, pain is sharp and it's numb and not able to sleep, per pt ibuprofen do not work

## 2017-07-31 NOTE — ED Provider Notes (Signed)
[facility]    CSN: [id number] Arrival date & time: 07/31/17  1238     History   Chief Complaint Chief Complaint  Patient presents with  . Foot Pain    HPI Brandon Boresroy Houlton Jr. is a 77 y.o. male   history of hypertension, hyperlipidemia, is presenting today with right foot pain.  [name] has had pain over the past 2 weeks in his right foot that is preventing him from sleeping.  States [name] has some tenderness, sharp stabbing sensations and numbness.  It is located all over his foot, heel, dorsal surface.  States [name] is also had toenail fungal infections for a while and has been applying cream to this.  [name] is also concerned about him between his toes as it has become increasingly tender.  [name] is concerned because his sister passed away of ALS and hers began with foot pain as well.  [name] has been wearing compression stocking on that leg.  [name] denies fevers.  States pain slightly improves with weightbearing and walking, worse when [name] is lying down in bed.  Patient does not have any calf pain no previous clots.  HPI  Past Medical History:  Diagnosis Date  . Carotid artery occlusion   . Colon polyps   . Degenerative arthritis of spine   . Diverticulosis   . Hemorrhoids   . Hemorrhoids   . Hyperlipidemia   . Hypertension    2010  . Sciatica   . Solar keratosis   . Vitamin B12 deficiency     Patient Active Problem List   Diagnosis Date Noted  . Allergic conjunctivitis 08/23/2016  . 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 Abnormality of gait 01/27/2015  . Chronic diastolic heart failure (HCC) 01/02/2015  . Preventive measure 01/05/2013  . Occlusion and stenosis of carotid artery without mention of cerebral infarction 11/12/2011  . Carotid stenosis 04/16/2011  . Lumbar degenerative disc disease 08/03/2010  . HEMORRHOIDS 03/11/2009  . Hyperlipidemia 01/29/2009  . CIGARETTE SMOKER 10/04/2008  . Essential hypertension, benign  10/04/2008    Past Surgical History:  Procedure Laterality Date  . CAROTID ENDARTERECTOMY  09/21/10   Right CEA  . NECK MASS EXCISION         Home Medications    Prior to Admission medications   Medication Sig Start Date End Date Taking? Authorizing Provider  AMBULATORY NON FORMULARY MEDICATION Knee-high, medium compression, graduated compression stockings. Apply to lower extremities. [email or web address], Zippered Compression Stockings, medium circ, long length 01/27/15   [name], Thomas J, MD  amLODipine (NORVASC) 5 MG tablet Take 1 tablet (5 mg total) by mouth daily. 11/02/16   [name], Thomas J, MD  azelastine (OPTIVAR) 0.05 % ophthalmic solution Place 2 drops into both eyes 2 (two) times daily. 08/23/16   [name], Thomas J, MD  cephALEXin (KEFLEX) 500 MG capsule Take 1 capsule (500 mg total) by mouth 3 (three) times daily for 10 days. 07/31/17 08/10/17  Cable Fearn C, PA-C  ferrous sulfate 325 (65 FE) MG EC tablet Take 1 tablet (325 mg total) by mouth 3 (three) times daily with meals. 02/17/15   [name], Thomas J, MD  folic acid (FOLVITE) 1 MG tablet Take 5 tablets (5 mg total) by mouth daily. 02/21/15   [name], Thomas J, MD  metoprolol succinate (TOPROL-XL) 25 MG 24 hr tablet TAKE 1 TABLET(25 MG) BY MOUTH DAILY 11/02/16   [name], Thomas J, MD  terbinafine (LAMISIL) 250 MG tablet Take 1 tablet (  250 mg total) by mouth daily for 14 days. 07/31/17 08/14/17  Raejean Swinford C, PA-C  valsartan-hydrochlorothiazide (DIOVAN-HCT) 320-25 MG tablet Take 0.5 tablets by mouth daily. 10/05/16   Monica Becton, MD  vitamin B-12 (CYANOCOBALAMIN) 1000 MCG tablet Take 1 tablet (1,000 mcg total) by mouth daily. 06/16/15   Monica Becton, MD    Family History Family History  Problem Relation Age of Onset  . Dementia Mother   . Heart attack Father        First MI age 31.  Marland Kitchen Heart disease Father 44  . Heart attack Brother        CAD, prior MI  . Hypertension  Brother   . ALS Sister     Social History Social History   Tobacco Use  . Smoking status: Former Smoker    Packs/day: 1.00    Years: 50.00    Pack years: 50.00    Types: Cigarettes    Last attempt to quit: 10/14/2010    Years since quitting: 6.8  . Smokeless tobacco: Never Used  Substance Use Topics  . Alcohol use: Yes    Alcohol/week: 0.0 oz    Comment: Patient drinks 3 beers daily  . Drug use: No     Allergies   Pollen extract   Review of Systems Review of Systems  Constitutional: Negative for chills and fever.  Eyes: Negative for pain and visual disturbance.  Respiratory: Negative for shortness of breath.   Cardiovascular: Negative for chest pain.  Gastrointestinal: Negative for abdominal pain, nausea and vomiting.  Genitourinary: Negative for decreased urine volume and difficulty urinating.  Musculoskeletal: Positive for arthralgias, joint swelling and myalgias. Negative for back pain.  Skin: Positive for color change.  Neurological: Negative for dizziness, seizures, syncope, light-headedness and headaches.  All other systems reviewed and are negative.    Physical Exam Triage Vital Signs ED Triage Vitals  Enc Vitals Group     BP 07/31/17 1338 (!) 161/79     Pulse Rate 07/31/17 1338 79     Resp --      Temp 07/31/17 1338 98.5 F (36.9 C)     Temp Source 07/31/17 1338 Oral     SpO2 07/31/17 1338 99 %     Weight --      Height --      Head Circumference --      Peak Flow --      Pain Score 07/31/17 1335 8     Pain Loc --      Pain Edu? --      Excl. in GC? --    No data found.  Updated Vital Signs BP (!) 161/79 (BP Location: Left Arm)   Pulse 79   Temp 98.5 F (36.9 C) (Oral)   SpO2 99%   Visual Acuity Right Eye Distance:   Left Eye Distance:   Bilateral Distance:    Right Eye Near:   Left Eye Near:    Bilateral Near:     Physical Exam  Constitutional: [name] appears well-developed and well-nourished.  HENT:  Head: Normocephalic and  atraumatic.  Eyes: Conjunctivae are normal.  Neck: Neck supple.  Cardiovascular: Normal rate and regular rhythm.  No murmur heard. Pulmonary/Chest: Effort normal and breath sounds normal. No respiratory distress.  Abdominal: Soft. There is no tenderness.  Musculoskeletal: [name] exhibits no edema.  Neurological: [name] is alert.  Skin: Skin is warm and dry.  Bilateral lower extremities showing chronic venous changes, varicosities.  Right foot with  erythema, swelling compared to left, tenderness to palpation of dorsum of foot.  Bilateral feet with yellow, cracking toenails with crusting.  Webs of toes between right fourth and fifth toes with crusting, skin breakdown.  No right calf tenderness or pain.  Dorsalis pedis 2+ on right foot.  Patient ambulating with antalgic gait, using cane for assistance.  Psychiatric: [name] has a normal mood and affect.  Nursing note and vitals reviewed.            UC Treatments / Results  Labs (all labs ordered are listed, but only abnormal results are displayed) Labs Reviewed  POCT I-STAT, CHEM 8 - Abnormal; Notable for the following components:      Result Value   BUN 22 (*)    Creatinine, Ser 1.60 (*)    All other components within normal limits  URIC ACID    EKG  EKG Interpretation None       Radiology No results found.  Procedures Procedures (including critical care time)  Medications Ordered in UC Medications - No data to display   Initial Impression / Assessment and Plan / UC Course  I have reviewed the triage vital signs and the nursing notes.  Pertinent labs & imaging results that were available during my care of the patient were reviewed by me and considered in my medical decision making (see chart for details).     Patient appears to have chronic venous stasis, and follow infection of toenails.  Exam concerning for cellulitis.  Will treat with Keflex 3 times daily for 10 days.  I-STAT obtained to check glucose for diabetes,  kidney function mildly elevated.  Advised to use Tylenol for pain, ibuprofen sparingly.  Also provide terbineafine orally for the next 2 weeks.  Follow-up in 2-3 days to check for improvement either here or PCP. Discussed strict return precautions. Patient verbalized understanding and is agreeable with plan.   Final Clinical Impressions(s) / UC Diagnoses   Final diagnoses:  Foot pain, right    ED Discharge Orders        Ordered    cephALEXin (KEFLEX) 500 MG capsule  3 times daily     07/31/17 1419    terbinafine (LAMISIL) 250 MG tablet  Daily     07/31/17 1419       Controlled Substance Prescriptions [place] Controlled Substance Registry consulted? Not Applicable   Lew Dawes, [place] 07/31/17 1516

## 2017-07-31 NOTE — Discharge Instructions (Signed)
We are going to treat for fungus with oral medicine daily for 2 weeks. Please take terbinafine daily for 14 days.   Please take Keflex every 8 hours for 10 days. This will help with infection that could be caused from poor circulation and fungus.   Please continue Tylenol for pain, Please use Ibuprofen sparingly.   Please return in 2-3 days for follow up either here or with your primary provider.

## 2017-08-04 ENCOUNTER — Ambulatory Visit (HOSPITAL_COMMUNITY)
Admission: EM | Admit: 2017-08-04 | Discharge: 2017-08-04 | Disposition: A | Discharge status: Home / Self Care | Payer: Medicare Other

## 2017-08-04 ENCOUNTER — Other Ambulatory Visit: Payer: Self-pay

## 2017-08-04 ENCOUNTER — Encounter (HOSPITAL_COMMUNITY): Payer: Self-pay | Admitting: Emergency Medicine

## 2017-08-04 ENCOUNTER — Ambulatory Visit (HOSPITAL_COMMUNITY)
Admit: 2017-08-04 | Discharge: 2017-08-04 | Disposition: A | Discharge status: Home / Self Care | Payer: Medicare Other | Attending: Sports Medicine | Admitting: Sports Medicine

## 2017-08-04 DIAGNOSIS — M79671 Pain in right foot: Secondary | ICD-10-CM | POA: Insufficient documentation

## 2017-08-04 DIAGNOSIS — M7989 Other specified soft tissue disorders: Secondary | ICD-10-CM | POA: Diagnosis not present

## 2017-08-04 DIAGNOSIS — M79609 Pain in unspecified limb: Secondary | ICD-10-CM | POA: Diagnosis not present

## 2017-08-04 MED ORDER — GABAPENTIN 100 MG PO CAPS: 100.0000 mg | ORAL_CAPSULE | Freq: Three times a day (TID) | ORAL | 0 refills | Status: DC

## 2017-08-04 MED ORDER — DOXYCYCLINE HYCLATE 100 MG PO CAPS: 100.0000 mg | ORAL_CAPSULE | Freq: Two times a day (BID) | ORAL | 0 refills | Status: AC

## 2017-08-04 MED ORDER — TRAMADOL HCL 50 MG PO TABS: 50.0000 mg | ORAL_TABLET | Freq: Four times a day (QID) | ORAL | 0 refills | Status: AC | PRN

## 2017-08-04 NOTE — Progress Notes (Signed)
*  Preliminary Results* Right lower extremity venous duplex completed. Right lower extremity is negative for deep vein thrombosis. There is no evidence of right Baker's cyst.  Incidental finding: there is a heterogenous area of the right groin, suggestive of possible prominent inguinal lymph node.  Preliminary results discussed with French Anaracy.  08/04/2017 4:25 PM  Gertie FeyMichelle Darlean Warmoth, BS, RVT, RDCS, RDMS

## 2017-08-04 NOTE — Discharge Instructions (Signed)
Please go to the vascular lab at the hospital for your appointment at 4:00.  Please begin doxycycline twice daily for the next 10 days.  Please stop the Keflex.  Please begin gabapentin 100 mg every 8 hours.  May use tramadol for severe pain, this may cause drowsiness.

## 2017-08-04 NOTE — ED Triage Notes (Addendum)
Pt reports that the sharp pains have gone away but he is still having a lot of pain and discharge from between his toes.  Pt states he didn't feel like taking his medications today other than for his foot so he has not had any BP medications for at least today.

## 2017-08-05 DIAGNOSIS — I998 Other disorder of circulatory system: Secondary | ICD-10-CM

## 2017-08-05 HISTORY — DX: Other disorder of circulatory system: I99.8

## 2017-08-05 NOTE — ED Provider Notes (Signed)
[facility]    CSN: [id number] Arrival date & time: 08/04/17  1339     History   Chief Complaint Chief Complaint  Patient presents with  . Follow-up    HPI [name]. is a 77 y.o. male history of hypertension hyperlipidemia presenting today for follow-up of foot pain.  He was seen here 4 days ago for pain in his right lower foot, swelling, redness, drainage between toes.  He was started on Keflex for possible cellulitis and Lamisil for fungal infection.  He states that since he has had an improvement in the sharp pains he has but he still has regular pain mainly in his foot.  He also endorses numbness, denies tingling.  Denies history of diabetes.  Pain worsens if he is standing for too long or for sitting too long.  Is wearing compression sock on right foot, states that this elicits increased pain.  HPI  Past Medical History:  Diagnosis Date  . Carotid artery occlusion   . Colon polyps   . Degenerative arthritis of spine   . Diverticulosis   . Hemorrhoids   . Hemorrhoids   . Hyperlipidemia   . Hypertension    2010  . Sciatica   . Solar keratosis   . Vitamin B12 deficiency     Patient Active Problem List   Diagnosis Date Noted  . Allergic conjunctivitis 08/23/2016  . 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 Abnormality of gait 01/27/2015  . Chronic diastolic heart failure (HCC) 01/02/2015  . Preventive measure 01/05/2013  . Occlusion and stenosis of carotid artery without mention of cerebral infarction 11/12/2011  . Carotid stenosis 04/16/2011  . Lumbar degenerative disc disease 08/03/2010  . HEMORRHOIDS 03/11/2009  . Hyperlipidemia 01/29/2009  . CIGARETTE SMOKER 10/04/2008  . Essential hypertension, benign 10/04/2008    Past Surgical History:  Procedure Laterality Date  . CAROTID ENDARTERECTOMY  09/21/10   Right CEA  . NECK MASS EXCISION         Home Medications    Prior to Admission  medications   Medication Sig Start Date End Date Taking? Authorizing Provider  acetaminophen (TYLENOL) 500 MG tablet Take 1,000 mg by mouth every 6 (six) hours as needed.   Yes [provider]  amLODipine (NORVASC) 5 MG tablet Take 1 tablet (5 mg total) by mouth daily. 11/02/16  Yes Monica Becton, MD  cephALEXin (KEFLEX) 500 MG capsule Take 1 capsule (500 mg total) by mouth 3 (three) times daily for 10 days. 07/31/17 08/10/17 Yes Cylah Fannin C, PA-C  ferrous sulfate 325 (65 FE) MG EC tablet Take 1 tablet (325 mg total) by mouth 3 (three) times daily with meals. 02/17/15  Yes Monica Becton, MD  folic acid (FOLVITE) 1 MG tablet Take 5 tablets (5 mg total) by mouth daily. 02/21/15  Yes Monica Becton, MD  metoprolol succinate (TOPROL-XL) 25 MG 24 hr tablet TAKE 1 TABLET(25 MG) BY MOUTH DAILY 11/02/16  Yes Monica Becton, MD  terbinafine (LAMISIL) 250 MG tablet Take 1 tablet (250 mg total) by mouth daily for 14 days. 07/31/17 08/14/17 Yes Malania Gawthrop C, PA-C  valsartan-hydrochlorothiazide (DIOVAN-HCT) 320-25 MG tablet Take 0.5 tablets by mouth daily. 10/05/16  Yes Monica Becton, MD  vitamin B-12 (CYANOCOBALAMIN) 1000 MCG tablet Take 1 tablet (1,000 mcg total) by mouth daily. 06/16/15  Yes Monica Becton, MD  AMBULATORY NON FORMULARY MEDICATION Knee-high, medium compression, graduated compression stockings. Apply to  lower extremities. [email or web address], Zippered Compression Stockings, medium circ, long length 01/27/15   [name], Thomas J, MD  azelastine (OPTIVAR) 0.05 % ophthalmic solution Place 2 drops into both eyes 2 (two) times daily. 08/23/16   [name], Thomas J, MD  doxycycline (VIBRAMYCIN) 100 MG capsule Take 1 capsule (100 mg total) by mouth 2 (two) times daily for 10 days. 08/04/17 08/14/17  Lachelle Rissler C, PA-C  gabapentin (NEURONTIN) 100 MG capsule Take 1 capsule (100 mg total) by mouth 3 (three) times daily for 10 days. 08/04/17  08/14/17  Dhani Dannemiller C, PA-C  traMADol (ULTRAM) 50 MG tablet Take 1 tablet (50 mg total) by mouth every 6 (six) hours as needed for up to 2 days. 08/04/17 08/06/17  Tolulope Pinkett, Junius CreamerHallie C, PA-C    Family History Family History  Problem Relation Age of Onset  . Dementia Mother   . Heart attack Father        First MI age 76.  Marland Kitchen. Heart disease Father 1660  . Heart attack Brother        CAD, prior MI  . Hypertension Brother   . ALS Sister     Social History Social History   Tobacco Use  . Smoking status: Former Smoker    Packs/day: 1.00    Years: 50.00    Pack years: 50.00    Types: Cigarettes    Last attempt to quit: 10/14/2010    Years since quitting: 6.8  . Smokeless tobacco: Never Used  Substance Use Topics  . Alcohol use: Yes    Alcohol/week: 0.0 oz    Comment: Patient drinks 3 beers daily  . Drug use: No     Allergies   Pollen extract   Review of Systems Review of Systems  Constitutional: Negative for chills and fever.  Eyes: Negative for pain and visual disturbance.  Respiratory: Negative for cough and shortness of breath.   Cardiovascular: Negative for chest pain and palpitations.  Gastrointestinal: Negative for abdominal pain, nausea and vomiting.  Musculoskeletal: Positive for arthralgias, gait problem and myalgias. Negative for back pain.  Skin: Positive for color change. Negative for rash.  Neurological: Positive for numbness. Negative for dizziness, syncope, weakness and light-headedness.  All other systems reviewed and are negative.    Physical Exam Triage Vital Signs ED Triage Vitals  Enc Vitals Group     BP 08/04/17 1358 (!) 170/146     Pulse Rate 08/04/17 1358 72     Resp --      Temp 08/04/17 1358 98.8 F (37.1 C)     Temp Source 08/04/17 1358 Oral     SpO2 08/04/17 1358 100 %     Weight --      Height --      Head Circumference --      Peak Flow --      Pain Score 08/04/17 1551 0     Pain Loc --      Pain Edu? --      Excl. in GC? --     No data found.  Updated Vital Signs BP (!) 170/146 (BP Location: Left Arm)   Pulse 72   Temp 98.8 F (37.1 C) (Oral)   SpO2 100%   Visual Acuity Right Eye Distance:   Left Eye Distance:   Bilateral Distance:    Right Eye Near:   Left Eye Near:    Bilateral Near:     Physical Exam  Constitutional: He is oriented to person, place, and time. He appears  well-developed and well-nourished.  HENT:  Head: Normocephalic and atraumatic.  Eyes: Conjunctivae are normal.  Neck: Neck supple.  Cardiovascular: Normal rate.  No murmur heard. Pulmonary/Chest: Effort normal.  Musculoskeletal: He exhibits edema and tenderness. He exhibits no deformity.  Right lower foot: Patient was a chronic venous changes, varicosities on the anterior aspect of the lower shin, increasing edema and erythema when foot is in dependent position.  Tenderness to palpation of the dorsum of the foot.  Drainage and skin breakdown between webs of toes, especially between the third and fourth and fourth and fifth toes.  Yellow thickened big toenail.  No erythema to calf, minor tenderness to palpation.  Neurological: He is alert and oriented to person, place, and time.  Skin: Skin is warm and dry.  Psychiatric: He has a normal mood and affect.  Nursing note and vitals reviewed.    UC Treatments / Results  Labs (all labs ordered are listed, but only abnormal results are displayed) Labs Reviewed - No data to display  EKG  EKG Interpretation None       Radiology No results found.  Procedures Procedures (including critical care time)  Medications Ordered in UC Medications - No data to display   Initial Impression / Assessment and Plan / UC Course  I have reviewed the triage vital signs and the nursing notes.  Pertinent labs & imaging results that were available during my care of the patient were reviewed by me and considered in my medical decision making (see chart for details).     Patient still  in significant amount of pain, but does not appear to have any improvement.  Will send for vascular ultrasound to check for DVT as cause of swelling, was negative, did show prominent lymph nodes in the groin.  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  Stop Keflex.  Continue Lamisil.  Will provide tramadol for severe pain.  We will add in gabapentin to see if this helps given numbness and possible nerve pain.  Follow-up with podiatry and PCP. Discussed strict return precautions. Patient verbalized understanding and is agreeable with plan.   Final Clinical Impressions(s) / UC Diagnoses   Final diagnoses:  Right foot pain    ED Discharge Orders        Ordered    doxycycline (VIBRAMYCIN) 100 MG capsule  2 times daily     08/04/17 1543    gabapentin (NEURONTIN) 100 MG capsule  3 times daily     08/04/17 1543    traMADol (ULTRAM) 50 MG tablet  Every 6 hours PRN     08/04/17 1544    VAS [place] LOWER EXTREMITY VENOUS (DVT)     08/04/17 1538       Controlled Substance Prescriptions [place] Controlled Substance Registry consulted? Not Applicable   Lew Dawes, [place] 08/05/17 1610

## 2017-08-09 ENCOUNTER — Ambulatory Visit (INDEPENDENT_AMBULATORY_CARE_PROVIDER_SITE_OTHER): Payer: Medicare Other | Admitting: Sports Medicine

## 2017-08-09 ENCOUNTER — Encounter: Payer: Self-pay | Admitting: Sports Medicine

## 2017-08-09 ENCOUNTER — Ambulatory Visit (HOSPITAL_COMMUNITY)
Admission: RE | Admit: 2017-08-09 | Discharge: 2017-08-09 | Disposition: A | Discharge status: Home / Self Care | Payer: Medicare Other | Source: Ambulatory Visit | Attending: Sports Medicine | Admitting: Sports Medicine

## 2017-08-09 DIAGNOSIS — R200 Anesthesia of skin: Secondary | ICD-10-CM | POA: Diagnosis not present

## 2017-08-09 DIAGNOSIS — R4589 Other symptoms and signs involving emotional state: Secondary | ICD-10-CM | POA: Insufficient documentation

## 2017-08-09 DIAGNOSIS — L089 Local infection of the skin and subcutaneous tissue, unspecified: Secondary | ICD-10-CM | POA: Diagnosis not present

## 2017-08-09 DIAGNOSIS — I10 Essential (primary) hypertension: Secondary | ICD-10-CM | POA: Diagnosis not present

## 2017-08-09 DIAGNOSIS — F329 Major depressive disorder, single episode, unspecified: Secondary | ICD-10-CM

## 2017-08-09 DIAGNOSIS — R600 Localized edema: Secondary | ICD-10-CM | POA: Insufficient documentation

## 2017-08-09 DIAGNOSIS — E785 Hyperlipidemia, unspecified: Secondary | ICD-10-CM | POA: Insufficient documentation

## 2017-08-09 DIAGNOSIS — M79671 Pain in right foot: Secondary | ICD-10-CM | POA: Insufficient documentation

## 2017-08-09 DIAGNOSIS — Z87891 Personal history of nicotine dependence: Secondary | ICD-10-CM | POA: Insufficient documentation

## 2017-08-09 DIAGNOSIS — I739 Peripheral vascular disease, unspecified: Secondary | ICD-10-CM | POA: Insufficient documentation

## 2017-08-09 LAB — COMPLETE METABOLIC PANEL WITH GFR
Albumin: 3.9 g/dL (ref 3.6–5.1)
Alkaline phosphatase (APISO): 61 U/L (ref 40–115)
BUN/Creatinine Ratio: 17 (calc) (ref 6–22)
BUN: 24 mg/dL (ref 7–25)
CO2: 28 mmol/L (ref 20–32)
Creat: 1.38 mg/dL — ABNORMAL HIGH (ref 0.70–1.18)
GFR, Est African American: 58 mL/min/{1.73_m2} — ABNORMAL LOW (ref >=60)
GFR, Est Non African American: 50 mL/min/{1.73_m2} — ABNORMAL LOW (ref >=60)
Globulin: 3.4 g/dL (calc) (ref 1.9–3.7)
Sodium: 136 mmol/L (ref 135–146)
Total Bilirubin: 0.6 mg/dL (ref 0.2–1.2)
Total Protein: 7.3 g/dL (ref 6.1–8.1)

## 2017-08-09 LAB — COMPLETE METABOLIC PANEL WITHOUT GFR
AG Ratio: 1.1 (calc) (ref 1.0–2.5)
ALT: 12 U/L (ref 9–46)
AST: 16 U/L (ref 10–35)
Calcium: 9.5 mg/dL (ref 8.6–10.3)
Chloride: 100 mmol/L (ref 98–110)
Glucose, Bld: 102 mg/dL — ABNORMAL HIGH (ref 65–99)
Potassium: 4.4 mmol/L (ref 3.5–5.3)

## 2017-08-09 MED ORDER — VALSARTAN-HYDROCHLOROTHIAZIDE 320-25 MG PO TABS: 1.0000 | ORAL_TABLET | Freq: Every day | ORAL | 3 refills | Status: DC

## 2017-08-09 MED ORDER — DULOXETINE HCL 30 MG PO CPEP: 30.0000 mg | ORAL_CAPSULE | Freq: Every day | ORAL | 3 refills | Status: DC

## 2017-08-09 MED ORDER — HYDROCODONE-ACETAMINOPHEN 5-325 MG PO TABS: 1.0000 | ORAL_TABLET | Freq: Three times a day (TID) | ORAL | 0 refills | Status: DC | PRN

## 2017-08-09 MED ORDER — GADOBENATE DIMEGLUMINE 529 MG/ML IV SOLN
20.0000 mL | Freq: Once | INTRAVENOUS | Status: AC | PRN
  Administered 2017-08-09: 20 mL via INTRAVENOUS

## 2017-08-09 MED ORDER — METRONIDAZOLE 500 MG PO TABS: 500.0000 mg | ORAL_TABLET | Freq: Two times a day (BID) | ORAL | 0 refills | Status: DC

## 2017-08-09 MED ORDER — CIPROFLOXACIN HCL 750 MG PO TABS: 750.0000 mg | ORAL_TABLET | Freq: Two times a day (BID) | ORAL | 0 refills | Status: DC

## 2017-08-09 MED ORDER — METOPROLOL SUCCINATE ER 50 MG PO TB24: ORAL_TABLET | ORAL | 3 refills | Status: DC

## 2017-08-09 NOTE — Assessment & Plan Note (Signed)
Increasing valsartan/HCTZ to 1 full tablet daily, increasing Toprol-XL to 50 mg daily.

## 2017-08-09 NOTE — Assessment & Plan Note (Addendum)
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 There is also concern for osteomyelitis. X-rays, MRI and MR angiogram of the right foot. Continue doxycycline, adding Cipro and Flagyl. Checking some routine labs. I would also like wound care to go out to his house.  Patient was given pain medication, he is not sitting still in the MRI scanner, refuses to do it, risks and benefits explained.

## 2017-08-09 NOTE — Assessment & Plan Note (Signed)
Adding low-dose Cymbalta.

## 2017-08-09 NOTE — Progress Notes (Addendum)
Subjective:    CC: Foot infection  HPI: [name] is a pleasant 77 year old male, for over a week now he has had worsening infection in his right foot, erythema from the big toe to the dorsal midfoot.  Pain between many of his toes.  He was started on Keflex, switched to doxycycline.  Symptoms have been persistent and worsening.  Mood disorder: Tearful in the exam room, tells me his depressive symptoms are more due to his loss of his own independence, as well as pain in his foot.   I reviewed the past medical history, family history, social history, surgical history, and allergies today and no changes were needed.  Please see the problem list section below in epic for further details.  Past Medical History: Past Medical History:  Diagnosis Date  . Carotid artery occlusion   . Colon polyps   . Degenerative arthritis of spine   . Diverticulosis   . Hemorrhoids   . Hemorrhoids   . Hyperlipidemia   . Hypertension    2010  . Sciatica   . Solar keratosis   . Vitamin B12 deficiency    Past Surgical History: Past Surgical History:  Procedure Laterality Date  . CAROTID ENDARTERECTOMY  09/21/10   Right CEA  . NECK MASS EXCISION     Social History: Social History   Socioeconomic History  . Marital status: Widowed    Spouse name: None  . Number of children: 2  . Years of education: HS  . Highest education level: None  Social Needs  . Financial resource strain: None  . Food insecurity - worry: None  . Food insecurity - inability: None  . Transportation needs - medical: None  . Transportation needs - non-medical: None  Occupational History  . Occupation: retired  Tobacco Use  . Smoking status: Former Smoker    Packs/day: 1.00    Years: 50.00    Pack years: 50.00    Types: Cigarettes    Last attempt to quit: 10/14/2010    Years since quitting: 6.8  . Smokeless tobacco: Never Used  Substance and Sexual Activity  . Alcohol use: Yes    Alcohol/week: 0.0 oz    Comment: Patient  drinks 3 beers daily  . Drug use: No  . Sexual activity: None  Other Topics Concern  . None  Social History Narrative   Patient drinks about 2 cups of coffee daily.   Patient is right handed.   Family History: Family History  Problem Relation Age of Onset  . Dementia Mother   . Heart attack Father        First MI age 76.  Marland Kitchen. Heart disease Father [id number]  . Heart attack Brother        CAD, prior MI  . Hypertension Brother   . ALS Sister    Allergies: Allergies  Allergen Reactions  . Pollen Extract    Medications: See med rec.  Review of Systems: No fevers, chills, night sweats, weight loss, chest pain, or shortness of breath.   Objective:    General: Well Developed, well nourished, and in no acute distress.  Neuro: Alert and oriented x3, extra-ocular muscles intact, sensation grossly intact.  HEENT: Normocephalic, atraumatic, pupils equal round reactive to light, neck supple, no masses, no lymphadenopathy, thyroid nonpalpable.  Skin: Warm and dry, no rashes. Cardiac: Regular rate and rhythm, no murmurs rubs or gallops, no lower extremity edema.  Respiratory: Clear to auscultation bilaterally. Not using accessory muscles, speaking in full sentences. Right foot:  Erythematous over the dorsal midfoot, maceration between the toes, tender all around.  The fourth toe is somewhat dark, dusky appearing.  I am unable to palpate his dorsalis pedis or posterior tibial pulses.  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 There is also concern for osteomyelitis. X-rays, MRI and MR angiogram of the right foot. Continue doxycycline, adding Cipro and Flagyl. Checking some routine labs. I would also like wound care to go out to his house.  Patient was given pain medication, he is not sitting still in the MRI scanner,  refuses to do it, risks and benefits explained.  Depressed mood Adding low-dose Cymbalta.  Essential hypertension, benign Increasing valsartan/HCTZ to 1 full tablet daily, increasing Toprol-XL to 50 mg daily.  I spent 40 minutes with this patient, greater than 50% was face-to-face time counseling regarding the above diagnoses ___________________________________________ Ihor Austin. Benjamin Stain, M.D., ABFM., CAQSM. Primary Care and Sports Medicine [facility]  Adjunct Instructor of Family Medicine  [employer]

## 2017-08-10 ENCOUNTER — Telehealth: Payer: Self-pay

## 2017-08-10 LAB — CBC WITH DIFFERENTIAL/PLATELET
Basophils Absolute: 86 {cells}/uL (ref 0–200)
Basophils Relative: 0.9 %
Eosinophils Absolute: 451 {cells}/uL (ref 15–500)
Eosinophils Relative: 4.7 %
HCT: 41.4 % (ref 38.5–50.0)
Hemoglobin: 14.2 g/dL (ref 13.2–17.1)
Lymphs Abs: 730 cells/uL — ABNORMAL LOW (ref 850–3900)
MCH: 31.8 pg (ref 27.0–33.0)
MCHC: 34.3 g/dL (ref 32.0–36.0)
MCV: 92.6 fL (ref 80.0–100.0)
MPV: 9.5 fL (ref 7.5–12.5)
Monocytes Relative: 11.5 %
Neutro Abs: 7229 cells/uL (ref 1500–7800)
Neutrophils Relative %: 75.3 %
Platelets: 247 Thousand/uL (ref 140–400)
RBC: 4.47 10*6/uL (ref 4.20–5.80)
RDW: 13.5 % (ref 11.0–15.0)
Total Lymphocyte: 7.6 %
WBC mixed population: 1104 {cells}/uL — ABNORMAL HIGH (ref 200–950)
WBC: 9.6 10*3/uL (ref 3.8–10.8)

## 2017-08-10 LAB — LIPID PANEL W/REFLEX DIRECT LDL
Cholesterol: 201 mg/dL — ABNORMAL HIGH (ref <200)
HDL: 56 mg/dL (ref >40)
LDL Cholesterol (Calc): 117 mg/dL — ABNORMAL HIGH
Non-HDL Cholesterol (Calc): 145 mg/dL (calc) — ABNORMAL HIGH (ref <130)
Total CHOL/HDL Ratio: 3.6 (calc) (ref <5.0)
Triglycerides: 158 mg/dL — ABNORMAL HIGH (ref <150)

## 2017-08-10 LAB — SEDIMENTATION RATE: Sed Rate: 67 mm/h — ABNORMAL HIGH (ref 0–20)

## 2017-08-10 LAB — HEMOGLOBIN A1C
Hgb A1c MFr Bld: 5.3 %{Hb} (ref <5.7)
Mean Plasma Glucose: 105 (calc)
eAG (mmol/L): 5.8 (calc)

## 2017-08-10 LAB — TSH: TSH: 0.33 m[IU]/L — ABNORMAL LOW (ref 0.40–4.50)

## 2017-08-10 MED ORDER — PROMETHAZINE HCL 25 MG PO TABS: 25.0000 mg | ORAL_TABLET | Freq: Four times a day (QID) | ORAL | 3 refills | Status: DC | PRN

## 2017-08-10 MED ORDER — ONDANSETRON 8 MG PO TBDP: 8.0000 mg | ORAL_TABLET | Freq: Three times a day (TID) | ORAL | 3 refills | Status: DC | PRN

## 2017-08-10 NOTE — Telephone Encounter (Signed)
[name] called and states he is unable to keep down food or medication due to vomiting. Denies fever, chills or sweats.    Home health needs wound care instructions.

## 2017-08-10 NOTE — Telephone Encounter (Signed)
As long as there is no melena, hematochezia, hematemesis he can do Zofran every 8 hours and Phenergan every 6 hours, take medication with a meal.  Medications called in.  In addition home health nursing should consist of daily wound debridement with soap and water, removing all devitalized tissues from between toes, the wound should then be dried, antibiotic ointment applied, nonstick dressing applied between toes, and then gauze used to wrap the toes and foot.  Foot should be kept elevated.  Should do a dose of his pain medication before wound care sessions.  This should occur daily.

## 2017-08-11 NOTE — Telephone Encounter (Signed)
Left a message for a return call.

## 2017-08-11 NOTE — Telephone Encounter (Signed)
Kendell Baneroy advised of recommendations.

## 2017-08-12 ENCOUNTER — Encounter: Payer: Self-pay | Admitting: Sports Medicine

## 2017-08-12 ENCOUNTER — Ambulatory Visit (INDEPENDENT_AMBULATORY_CARE_PROVIDER_SITE_OTHER): Payer: Medicare Other | Admitting: Sports Medicine

## 2017-08-12 DIAGNOSIS — L089 Local infection of the skin and subcutaneous tissue, unspecified: Secondary | ICD-10-CM | POA: Diagnosis not present

## 2017-08-12 DIAGNOSIS — I10 Essential (primary) hypertension: Secondary | ICD-10-CM

## 2017-08-12 NOTE — Progress Notes (Signed)
Subjective:    CC: Follow-up  HPI: Foot infection: About the same, MRI did not show evidence of osteomyelitis, more cellulitis, MR angiogram showed patency of the dorsalis pedis and posterior tibial arteries but because of movement artifact we could not see contrast filling into the digital arteries.  He has been on Cipro, Flagyl, doxycycline for about 3 days now.  Has not yet heard back from home health wound care.  Hypertension: Blood pressure was very elevated at the last visit, we increased his valsartan/HCTZ and is a bit soft today, little bit dizzy.  No headaches, visual changes, chest pain, shortness of breath.  I reviewed the past medical history, family history, social history, surgical history, and allergies today and no changes were needed.  Please see the problem list section below in epic for further details.  Past Medical History: Past Medical History:  Diagnosis Date  . Carotid artery occlusion   . Colon polyps   . Degenerative arthritis of spine   . Diverticulosis   . Hemorrhoids   . Hemorrhoids   . Hyperlipidemia   . Hypertension    2010  . Sciatica   . Solar keratosis   . Vitamin B12 deficiency    Past Surgical History: Past Surgical History:  Procedure Laterality Date  . CAROTID ENDARTERECTOMY  09/21/10   Right CEA  . NECK MASS EXCISION     Social History: Social History   Socioeconomic History  . Marital status: Widowed    Spouse name: None  . Number of children: 2  . Years of education: HS  . Highest education level: None  Social Needs  . Financial resource strain: None  . Food insecurity - worry: None  . Food insecurity - inability: None  . Transportation needs - medical: None  . Transportation needs - non-medical: None  Occupational History  . Occupation: retired  Tobacco Use  . Smoking status: Former Smoker    Packs/day: 1.00    Years: 50.00    Pack years: 50.00    Types: Cigarettes    Last attempt to quit: 10/14/2010    Years since  quitting: 6.8  . Smokeless tobacco: Never Used  Substance and Sexual Activity  . Alcohol use: Yes    Alcohol/week: 0.0 oz    Comment: Patient drinks 3 beers daily  . Drug use: No  . Sexual activity: None  Other Topics Concern  . None  Social History Narrative   Patient drinks about 2 cups of coffee daily.   Patient is right handed.   Family History: Family History  Problem Relation Age of Onset  . Dementia Mother   . Heart attack Father        First MI age 76.  Marland Kitchen. Heart disease Father [id number]  . Heart attack Brother        CAD, prior MI  . Hypertension Brother   . ALS Sister    Allergies: Allergies  Allergen Reactions  . Pollen Extract    Medications: See med rec.  Review of Systems: No fevers, chills, night sweats, weight loss, chest pain, or shortness of breath.   Objective:    General: Well Developed, well nourished, and in no acute distress.  Neuro: Alert and oriented x3, extra-ocular muscles intact, sensation grossly intact.  HEENT: Normocephalic, atraumatic, pupils equal round reactive to light, neck supple, no masses, no lymphadenopathy, thyroid nonpalpable.  Skin: Warm and dry, no rashes. Cardiac: Regular rate and rhythm, no murmurs rubs or gallops, no lower extremity edema.  Respiratory: Clear to auscultation bilaterally. Not using accessory muscles, speaking in full sentences. Right foot: Fourth toe does continue to appear a bit dusky and dark at the tip, there is still moderate erythema without much induration over the dorsal midfoot with tenderness to palpation.  There is maceration of the tissues between the toes, this was debrided aggressively, irrigated.  Impression and Recommendations:    Infection of right foot No evidence of osteomyelitis, the fourth toe does appear somewhat dusky, I am concerned that it lost its vascular supply and may need amputation. His MRA was of low sensitivity due to moving around in the MRI scanner. No evidence of osteomyelitis on  MRI. I would like him to see Dr. Raynald Kemp on Monday. Wound was debrided aggressively today. Continue doxy, Cipro, Flagyl. He should be getting contacted by home health wound care nursing. Return to see me at the end of next week on Friday.  I spent 25 minutes with this patient, greater than 50% was face-to-face time counseling regarding the above diagnoses ___________________________________________ [name]. Benjamin Stain, M.D., ABFM., CAQSM. Primary Care and Sports Medicine [facility]  Adjunct Instructor of Family Medicine  [employer]

## 2017-08-12 NOTE — Assessment & Plan Note (Signed)
Blood pressures are little bit soft today, slightly dizzy and weak. Decreasing lisinopril/HCTZ back to one half tab daily, continue current dosing of amlodipine and metoprolol.

## 2017-08-12 NOTE — Assessment & Plan Note (Signed)
No evidence of osteomyelitis, the fourth toe does appear somewhat dusky, I am concerned that it lost its vascular supply and may need amputation. His MRA was of low sensitivity due to moving around in the MRI scanner. No evidence of osteomyelitis on MRI. I would like him to see Dr. Raynald KempSheard on Monday. Wound was debrided aggressively today. Continue doxy, Cipro, Flagyl. He should be getting contacted by home health wound care nursing. Return to see me at the end of next week on Friday.

## 2017-08-15 ENCOUNTER — Telehealth: Payer: Self-pay | Admitting: Sports Medicine

## 2017-08-15 ENCOUNTER — Encounter: Payer: Self-pay | Admitting: Podiatry

## 2017-08-15 ENCOUNTER — Emergency Department (HOSPITAL_COMMUNITY): Payer: Medicare Other

## 2017-08-15 ENCOUNTER — Inpatient Hospital Stay (HOSPITAL_COMMUNITY)
Admission: EM | Admit: 2017-08-15 | Discharge: 2017-08-26 | DRG: 253 | Disposition: A | Discharge status: Home Health | Payer: Medicare Other | Attending: Vascular Surgery | Admitting: Vascular Surgery

## 2017-08-15 ENCOUNTER — Encounter (HOSPITAL_COMMUNITY): Payer: Self-pay | Admitting: Emergency Medicine

## 2017-08-15 ENCOUNTER — Ambulatory Visit: Payer: Medicare Other | Admitting: Podiatry

## 2017-08-15 VITALS — BP 124/74 | HR 86 | Temp 98.5°F

## 2017-08-15 DIAGNOSIS — Z79899 Other long term (current) drug therapy: Secondary | ICD-10-CM

## 2017-08-15 DIAGNOSIS — Z01818 Encounter for other preprocedural examination: Secondary | ICD-10-CM

## 2017-08-15 DIAGNOSIS — R001 Bradycardia, unspecified: Secondary | ICD-10-CM | POA: Diagnosis present

## 2017-08-15 DIAGNOSIS — G629 Polyneuropathy, unspecified: Secondary | ICD-10-CM | POA: Diagnosis present

## 2017-08-15 DIAGNOSIS — R079 Chest pain, unspecified: Secondary | ICD-10-CM | POA: Diagnosis not present

## 2017-08-15 DIAGNOSIS — Z01810 Encounter for preprocedural cardiovascular examination: Secondary | ICD-10-CM | POA: Diagnosis not present

## 2017-08-15 DIAGNOSIS — I5032 Chronic diastolic (congestive) heart failure: Secondary | ICD-10-CM | POA: Diagnosis present

## 2017-08-15 DIAGNOSIS — I130 Hypertensive heart and chronic kidney disease with heart failure and stage 1 through stage 4 chronic kidney disease, or unspecified chronic kidney disease: Secondary | ICD-10-CM | POA: Diagnosis present

## 2017-08-15 DIAGNOSIS — I998 Other disorder of circulatory system: Secondary | ICD-10-CM | POA: Diagnosis present

## 2017-08-15 DIAGNOSIS — Z8249 Family history of ischemic heart disease and other diseases of the circulatory system: Secondary | ICD-10-CM

## 2017-08-15 DIAGNOSIS — I96 Gangrene, not elsewhere classified: Secondary | ICD-10-CM

## 2017-08-15 DIAGNOSIS — L97519 Non-pressure chronic ulcer of other part of right foot with unspecified severity: Secondary | ICD-10-CM | POA: Diagnosis present

## 2017-08-15 DIAGNOSIS — E1165 Type 2 diabetes mellitus with hyperglycemia: Secondary | ICD-10-CM | POA: Diagnosis present

## 2017-08-15 DIAGNOSIS — I771 Stricture of artery: Secondary | ICD-10-CM | POA: Diagnosis present

## 2017-08-15 DIAGNOSIS — Z86010 Personal history of colonic polyps: Secondary | ICD-10-CM | POA: Diagnosis not present

## 2017-08-15 DIAGNOSIS — E1152 Type 2 diabetes mellitus with diabetic peripheral angiopathy with gangrene: Secondary | ICD-10-CM | POA: Diagnosis present

## 2017-08-15 DIAGNOSIS — E11621 Type 2 diabetes mellitus with foot ulcer: Secondary | ICD-10-CM | POA: Diagnosis present

## 2017-08-15 DIAGNOSIS — I10 Essential (primary) hypertension: Secondary | ICD-10-CM | POA: Diagnosis present

## 2017-08-15 DIAGNOSIS — E1122 Type 2 diabetes mellitus with diabetic chronic kidney disease: Secondary | ICD-10-CM | POA: Diagnosis present

## 2017-08-15 DIAGNOSIS — I70201 Unspecified atherosclerosis of native arteries of extremities, right leg: Secondary | ICD-10-CM | POA: Diagnosis present

## 2017-08-15 DIAGNOSIS — L409 Psoriasis, unspecified: Secondary | ICD-10-CM | POA: Diagnosis present

## 2017-08-15 DIAGNOSIS — F17200 Nicotine dependence, unspecified, uncomplicated: Secondary | ICD-10-CM | POA: Diagnosis present

## 2017-08-15 DIAGNOSIS — Z9889 Other specified postprocedural states: Secondary | ICD-10-CM | POA: Diagnosis not present

## 2017-08-15 DIAGNOSIS — L97419 Non-pressure chronic ulcer of right heel and midfoot with unspecified severity: Secondary | ICD-10-CM | POA: Diagnosis present

## 2017-08-15 DIAGNOSIS — L03031 Cellulitis of right toe: Secondary | ICD-10-CM | POA: Diagnosis present

## 2017-08-15 DIAGNOSIS — I2510 Atherosclerotic heart disease of native coronary artery without angina pectoris: Secondary | ICD-10-CM | POA: Diagnosis not present

## 2017-08-15 DIAGNOSIS — M79671 Pain in right foot: Secondary | ICD-10-CM | POA: Diagnosis present

## 2017-08-15 DIAGNOSIS — E785 Hyperlipidemia, unspecified: Secondary | ICD-10-CM | POA: Diagnosis not present

## 2017-08-15 DIAGNOSIS — F17210 Nicotine dependence, cigarettes, uncomplicated: Secondary | ICD-10-CM | POA: Diagnosis present

## 2017-08-15 DIAGNOSIS — I739 Peripheral vascular disease, unspecified: Secondary | ICD-10-CM

## 2017-08-15 DIAGNOSIS — N179 Acute kidney failure, unspecified: Secondary | ICD-10-CM | POA: Diagnosis not present

## 2017-08-15 DIAGNOSIS — D509 Iron deficiency anemia, unspecified: Secondary | ICD-10-CM | POA: Diagnosis not present

## 2017-08-15 DIAGNOSIS — I70261 Atherosclerosis of native arteries of extremities with gangrene, right leg: Secondary | ICD-10-CM

## 2017-08-15 DIAGNOSIS — E538 Deficiency of other specified B group vitamins: Secondary | ICD-10-CM | POA: Diagnosis present

## 2017-08-15 DIAGNOSIS — N183 Chronic kidney disease, stage 3 (moderate): Secondary | ICD-10-CM | POA: Diagnosis present

## 2017-08-15 HISTORY — DX: Other disorder of circulatory system: I99.8

## 2017-08-15 LAB — BASIC METABOLIC PANEL
ANION GAP: 11 (ref 5–15)
BUN: 60 mg/dL — ABNORMAL HIGH (ref 6–20)
CO2: 25 mmol/L (ref 22–32)
Calcium: 9.4 mg/dL (ref 8.9–10.3)
Chloride: 101 mmol/L (ref 101–111)
Creatinine, Ser: 2.52 mg/dL — ABNORMAL HIGH (ref 0.61–1.24)
GFR, EST AFRICAN AMERICAN: 27 mL/min — AB (ref >60)
GFR, EST NON AFRICAN AMERICAN: 23 mL/min — AB (ref >60)
Glucose, Bld: 106 mg/dL — ABNORMAL HIGH (ref 65–99)
POTASSIUM: 4.1 mmol/L (ref 3.5–5.1)
Sodium: 137 mmol/L (ref 135–145)

## 2017-08-15 LAB — CBC WITH DIFFERENTIAL/PLATELET
BASOS ABS: 0.1 10*3/uL (ref 0.0–0.1)
Basophils Relative: 1 %
EOS PCT: 2 %
Eosinophils Absolute: 0.2 10*3/uL (ref 0.0–0.7)
HCT: 44.2 % (ref 39.0–52.0)
Hemoglobin: 14.5 g/dL (ref 13.0–17.0)
Lymphocytes Relative: 8 %
Lymphs Abs: 0.9 10*3/uL (ref 0.7–4.0)
MCH: 31.8 pg (ref 26.0–34.0)
MCHC: 32.8 g/dL (ref 30.0–36.0)
MCV: 96.9 fL (ref 78.0–100.0)
Monocytes Absolute: 1.5 10*3/uL — ABNORMAL HIGH (ref 0.1–1.0)
Monocytes Relative: 14 %
Neutro Abs: 8 10*3/uL — ABNORMAL HIGH (ref 1.7–7.7)
Neutrophils Relative %: 75 %
PLATELETS: 289 10*3/uL (ref 150–400)
RBC: 4.56 MIL/uL (ref 4.22–5.81)
RDW: 14.2 % (ref 11.5–15.5)
WBC: 10.6 10*3/uL — AB (ref 4.0–10.5)

## 2017-08-15 LAB — I-STAT CG4 LACTIC ACID, ED: LACTIC ACID, VENOUS: 1.39 mmol/L (ref 0.5–1.9)

## 2017-08-15 MED ORDER — SODIUM CHLORIDE 0.9 % IV BOLUS (SEPSIS)
1000.0000 mL | Freq: Once | INTRAVENOUS | Status: AC
  Administered 2017-08-15: 1000 mL via INTRAVENOUS

## 2017-08-15 MED ORDER — VANCOMYCIN HCL IN DEXTROSE 1-5 GM/200ML-% IV SOLN
1000.0000 mg | Freq: Once | INTRAVENOUS | Status: AC
  Administered 2017-08-15: 1000 mg via INTRAVENOUS
  Filled 2017-08-15: qty 200

## 2017-08-15 MED ORDER — MORPHINE SULFATE (PF) 4 MG/ML IV SOLN
4.0000 mg | Freq: Once | INTRAVENOUS | Status: AC
  Administered 2017-08-15: 4 mg via INTRAVENOUS
  Filled 2017-08-15: qty 1

## 2017-08-15 NOTE — ED Triage Notes (Signed)
Pt c/o necrotic 4th & 5th R toes, pt has had necrotic 4th toe for several weeks and 5th toe became necrotic over past 2-3 days. Pt saw podiatry today, advised patient to come to ED for vascular eval. Pt states R foot is painful and takes hydrocodone for pain.z

## 2017-08-15 NOTE — Patient Instructions (Signed)
Reviewed findings and poor prognosis. Advised to check in at ER.

## 2017-08-15 NOTE — Consult Note (Addendum)
Referring Physician: Dr [facility]  Patient name: Brandon Boresroy Nolley Jr. MRN: [id number] DOB: 01/31/1941 Sex: male  REASON FOR CONSULT: gangrene right foot  HPI: Brandon Boresroy Foos Jr. is a 77 y.o. male with a 4-6 week history of pain in right foot.  He then developed duskiness of the 5th toe followed by the fourth toe.  He was sent to the ER today by his podiatrist with concerns for infection.  He currently smokes 1/2 ppd.  He denies history of diabetes.  He had left carotid endarterectomy by my partner Dr Imogene Burnhen in 2012.   Other medical problems include hypertension and hyperlipidemia which have been stable.    Past Medical History:  Diagnosis Date  . Carotid artery occlusion   . Colon polyps   . Degenerative arthritis of spine   . Diverticulosis   . Hemorrhoids   . Hemorrhoids   . Hyperlipidemia   . Hypertension    2010  . Sciatica   . Solar keratosis   . Vitamin B12 deficiency    Past Surgical History:  Procedure Laterality Date  . CAROTID ENDARTERECTOMY  09/21/10   Right CEA  . NECK MASS EXCISION      Family History  Problem Relation Age of Onset  . Dementia Mother   . Heart attack Father        First MI age 76.  Marland Kitchen. Heart disease Father 7060  . Heart attack Brother        CAD, prior MI  . Hypertension Brother   . ALS Sister     SOCIAL HISTORY: Social History   Socioeconomic History  . Marital status: Widowed    Spouse name: Not on file  . Number of children: 2  . Years of education: HS  . Highest education level: Not on file  Social Needs  . Financial resource strain: Not on file  . Food insecurity - worry: Not on file  . Food insecurity - inability: Not on file  . Transportation needs - medical: Not on file  . Transportation needs - non-medical: Not on file  Occupational History  . Occupation: retired  Tobacco Use  . Smoking status: Former Smoker    Packs/day: 1.00    Years: 50.00    Pack years: 50.00    Types: Cigarettes    Last attempt to quit: 10/14/2010   Years since quitting: 6.8  . Smokeless tobacco: Never Used  Substance and Sexual Activity  . Alcohol use: Yes    Alcohol/week: 0.0 oz    Comment: Patient drinks 3 beers daily  . Drug use: No  . Sexual activity: Not on file  Other Topics Concern  . Not on file  Social History Narrative   Patient drinks about 2 cups of coffee daily.   Patient is right handed.    Allergies  Allergen Reactions  . Pollen Extract     Current Facility-Administered Medications  Medication Dose Route Frequency Provider Last Rate Last Dose  . vancomycin (VANCOCIN) IVPB 1000 mg/200 mL premix  1,000 mg Intravenous Once Liberty HandyGibbons, Claudia J, PA-C       Current Outpatient Medications  Medication Sig Dispense Refill  . amLODipine (NORVASC) 5 MG tablet Take 1 tablet (5 mg total) by mouth daily. 30 tablet 3  . ciprofloxacin (CIPRO) 750 MG tablet Take 1 tablet (750 mg total) by mouth 2 (two) times daily for 10 days. 14 tablet 0  . DULoxetine (CYMBALTA) 30 MG capsule Take 1 capsule (30 mg total) by  mouth daily. 30 capsule 3  . ferrous sulfate 325 (65 FE) MG EC tablet Take 1 tablet (325 mg total) by mouth 3 (three) times daily with meals. 90 tablet 11  . HYDROcodone-acetaminophen (NORCO/VICODIN) 5-325 MG tablet Take 1 tablet by mouth every 8 (eight) hours as needed for moderate pain. 15 tablet 0  . metoprolol succinate (TOPROL-XL) 50 MG 24 hr tablet TAKE 1 TABLET(25 MG) BY MOUTH DAILY 30 tablet 3  . metroNIDAZOLE (FLAGYL) 500 MG tablet Take 1 tablet (500 mg total) by mouth 2 (two) times daily for 7 days. 14 tablet 0  . promethazine (PHENERGAN) 25 MG tablet Take 1 tablet (25 mg total) by mouth every 6 (six) hours as needed for nausea. 30 tablet 3  . valsartan-hydrochlorothiazide (DIOVAN-HCT) 320-25 MG tablet Take 0.5 tablets by mouth daily.    . AMBULATORY NON FORMULARY MEDICATION Knee-high, medium compression, graduated compression stockings. Apply to lower extremities. [employer], Zippered Compression  Stockings, medium circ, long length 1 each 0  . azelastine (OPTIVAR) 0.05 % ophthalmic solution Place 2 drops into both eyes 2 (two) times daily. (Patient not taking: Reported on 08/15/2017) 6 mL 11  . folic acid (FOLVITE) 1 MG tablet Take 5 tablets (5 mg total) by mouth daily. (Patient not taking: Reported on 08/15/2017) 150 tablet 11  . gabapentin (NEURONTIN) 100 MG capsule Take 1 capsule (100 mg total) by mouth 3 (three) times daily for 10 days. (Patient not taking: Reported on 08/15/2017) 30 capsule 0  . ondansetron (ZOFRAN-ODT) 8 MG disintegrating tablet Take 1 tablet (8 mg total) by mouth every 8 (eight) hours as needed for nausea. (Patient not taking: Reported on 08/15/2017) 20 tablet 3  . vitamin B-12 (CYANOCOBALAMIN) 1000 MCG tablet Take 1 tablet (1,000 mcg total) by mouth daily. (Patient not taking: Reported on 08/15/2017) 90 tablet 3    ROS:   General:  No weight loss, Fever, chills  HEENT: No recent headaches, no nasal bleeding, no visual changes, no sore throat  Neurologic: No dizziness, blackouts, seizures. No recent symptoms of stroke or mini- stroke. No recent episodes of slurred speech, or temporary blindness.  Cardiac: No recent episodes of chest pain/pressure, no shortness of breath at rest.  + shortness of breath with exertion.  Denies history of atrial fibrillation or irregular heartbeat  Vascular: + history of rest pain in feet.  No history of claudication.  + history of non-healing ulcer, No history of DVT   Pulmonary: No home oxygen, no productive cough, no hemoptysis,  No asthma or wheezing  Musculoskeletal:  [ ]  Arthritis, [ ]  Low back pain,  [ ]  Joint pain  Hematologic:No history of hypercoagulable state.  No history of easy bleeding.  No history of anemia  Gastrointestinal: No hematochezia or melena,  No gastroesophageal reflux, no trouble swallowing  Urinary: [X]  chronic Kidney disease, [ ]  on HD - [ ]  MWF or [ ]  TTHS, [ ]  Burning with urination, [ ]  Frequent  urination, [ ]  Difficulty urinating;   Skin: No rashes  Psychological: No history of anxiety,  No history of depression   Physical Examination  Vitals:   08/15/17 1457 08/15/17 1741 08/15/17 1825  BP: (!) 162/66 (!) 151/76 (!) 151/71  Pulse: 84 85 67  Resp: 20 16 16   Temp: 98.5 F (36.9 C)    TempSrc: Oral    SpO2:  100% 100%    There is no height or weight on file to calculate BMI.  General:  Alert and oriented, no acute distress  HEENT: Normal Neck: No JVD Pulmonary: Clear to auscultation bilaterally Cardiac: Regular Rate and Rhythm  Abdomen: Soft, non-tender, non-distended, no mass, obese Skin: No rash, right foot dependent rubor, gangrene toes 4 5 right foot, dark ulcer over skin crack medial aspect right heel Extremity Pulses:  2+ radial, brachial, femoral, absent popliteal dorsalis pedis, posterior tibial pulses bilaterally Musculoskeletal: No deformity trace pretibial and pedal edema right foot  Neurologic: Upper and lower extremity motor 5/5 and symmetric  DATA:  BMET    Component Value Date/Time   NA 137 08/15/2017 1840   K 4.1 08/15/2017 1840   CL 101 08/15/2017 1840   CO2 25 08/15/2017 1840   GLUCOSE 106 (H) 08/15/2017 1840   BUN 60 (H) 08/15/2017 1840   CREATININE 2.52 (H) 08/15/2017 1840   CREATININE 1.38 (H) 08/09/2017 0948   CALCIUM 9.4 08/15/2017 1840   GFRNONAA 23 (L) 08/15/2017 1840   GFRNONAA 50 (L) 08/09/2017 0948   GFRAA 27 (L) 08/15/2017 1840   GFRAA 58 (L) 08/09/2017 0948    CBC    Component Value Date/Time   WBC 10.6 (H) 08/15/2017 1840   RBC 4.56 08/15/2017 1840   HGB 14.5 08/15/2017 1840   HCT 44.2 08/15/2017 1840   PLT 289 08/15/2017 1840   MCV 96.9 08/15/2017 1840   MCH 31.8 08/15/2017 1840   MCHC 32.8 08/15/2017 1840   RDW 14.2 08/15/2017 1840   LYMPHSABS 0.9 08/15/2017 1840   MONOABS 1.5 (H) 08/15/2017 1840   EOSABS 0.2 08/15/2017 1840   BASOSABS 0.1 08/15/2017 1840   MRI foot, no osteo or abscess DP  patent  ASSESSMENT:  1.  Gangrene right foot with rest pain at risk for limb loss  2.  Acute renal failure   PLAN:  1. Would stop valsartan and hydrate avoid any other nephrotoxic drugs  2.  Tentatively will plan aortogram with bilateral runoff possible intervention on Thursday 3/14 by Dr Imogene Burn if his renal function has improved.  3. Dry gauze between toes right foot  4. ABIs tomorrow  5.  Pt will need transfer to [facility]   Fabienne Bruns, MD [facility] Office: [phone] Pager: [phone]

## 2017-08-15 NOTE — Progress Notes (Signed)
SUBJECTIVE: 77 y.o. year old male presents accompanied by his daughter via wheel chair to check on right foot lesion. Stated that he saw Dr. Karie Schwalbe. 3 days ago. He is on antibiotics for the foot lesion.  Daughter also stated that she has noticed this morning that her father has been sluggish in talking, tongue is not tucked in and sticking out more, and with some incoherence.  Patient is referred by Dr. Benjamin Stainhekkekandam.   Review of Systems  Constitutional: Negative for chills and fever.  HENT: Negative.   Respiratory: Negative.   Cardiovascular: Negative.   Gastrointestinal: Negative.   Genitourinary: Negative.   Musculoskeletal: Negative.        Difficulty walking.  Skin:       Infection right foot 4th and 5th toes.    OBJECTIVE: DERMATOLOGIC EXAMINATION: Dark discolored 4th and 5th digit with tissue hardness over distal 1/2. Moist interdigital space involving 4th and 5th digit right.  VASCULAR EXAMINATION OF LOWER LIMBS: Pedal pulses are not palpable on affected right lower limb. Mild forefoot edema right.  Cool to touch forefoot right.   NEUROLOGIC EXAMINATION OF THE LOWER LIMBS: Deferred.  MUSCULOSKELETAL EXAMINATION: No gross deformities noted.   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 Possible loss of vitality of 4th and 5th digit right.  PLAN: Reviewed findings and available options with patient and his daughter. Explained that it may be the best course of action to check in ER to have further clinical test on vascularity of the right lower limb, and follow up at wound care center.

## 2017-08-15 NOTE — H&P (View-Only) (Signed)
Referring Physician: Dr Jonne PlyKnapp [facility]  Patient name: Brandon Boresroy Nolley Jr. MRN: [id number] DOB: 01/31/1941 Sex: male  REASON FOR CONSULT: gangrene right foot  HPI: Brandon Boresroy Foos Jr. is a 77 y.o. male with a 4-6 week history of pain in right foot.  He then developed duskiness of the 5th toe followed by the fourth toe.  He was sent to the ER today by his podiatrist with concerns for infection.  He currently smokes 1/2 ppd.  He denies history of diabetes.  He had left carotid endarterectomy by my partner Dr Imogene Burnhen in 2012.   Other medical problems include hypertension and hyperlipidemia which have been stable.    Past Medical History:  Diagnosis Date  . Carotid artery occlusion   . Colon polyps   . Degenerative arthritis of spine   . Diverticulosis   . Hemorrhoids   . Hemorrhoids   . Hyperlipidemia   . Hypertension    2010  . Sciatica   . Solar keratosis   . Vitamin B12 deficiency    Past Surgical History:  Procedure Laterality Date  . CAROTID ENDARTERECTOMY  09/21/10   Right CEA  . NECK MASS EXCISION      Family History  Problem Relation Age of Onset  . Dementia Mother   . Heart attack Father        First MI age 76.  Marland Kitchen. Heart disease Father [id number]  . Heart attack Brother        CAD, prior MI  . Hypertension Brother   . ALS Sister     SOCIAL HISTORY: Social History   Socioeconomic History  . Marital status: Widowed    Spouse name: Not on file  . Number of children: 2  . Years of education: HS  . Highest education level: Not on file  Social Needs  . Financial resource strain: Not on file  . Food insecurity - worry: Not on file  . Food insecurity - inability: Not on file  . Transportation needs - medical: Not on file  . Transportation needs - non-medical: Not on file  Occupational History  . Occupation: retired  Tobacco Use  . Smoking status: Former Smoker    Packs/day: 1.00    Years: 50.00    Pack years: 50.00    Types: Cigarettes    Last attempt to quit: 10/14/2010   Years since quitting: 6.8  . Smokeless tobacco: Never Used  Substance and Sexual Activity  . Alcohol use: Yes    Alcohol/week: 0.0 oz    Comment: Patient drinks 3 beers daily  . Drug use: No  . Sexual activity: Not on file  Other Topics Concern  . Not on file  Social History Narrative   Patient drinks about 2 cups of coffee daily.   Patient is right handed.    Allergies  Allergen Reactions  . Pollen Extract     Current Facility-Administered Medications  Medication Dose Route Frequency Provider Last Rate Last Dose  . vancomycin (VANCOCIN) IVPB 1000 mg/200 mL premix  1,000 mg Intravenous Once Liberty HandyGibbons, Claudia J, PA-C       Current Outpatient Medications  Medication Sig Dispense Refill  . amLODipine (NORVASC) 5 MG tablet Take 1 tablet (5 mg total) by mouth daily. 30 tablet 3  . ciprofloxacin (CIPRO) 750 MG tablet Take 1 tablet (750 mg total) by mouth 2 (two) times daily for 10 days. 14 tablet 0  . DULoxetine (CYMBALTA) 30 MG capsule Take 1 capsule (30 mg total) by  mouth daily. 30 capsule 3  . ferrous sulfate 325 (65 FE) MG EC tablet Take 1 tablet (325 mg total) by mouth 3 (three) times daily with meals. 90 tablet 11  . HYDROcodone-acetaminophen (NORCO/VICODIN) 5-325 MG tablet Take 1 tablet by mouth every 8 (eight) hours as needed for moderate pain. 15 tablet 0  . metoprolol succinate (TOPROL-XL) 50 MG 24 hr tablet TAKE 1 TABLET(25 MG) BY MOUTH DAILY 30 tablet 3  . metroNIDAZOLE (FLAGYL) 500 MG tablet Take 1 tablet (500 mg total) by mouth 2 (two) times daily for 7 days. 14 tablet 0  . promethazine (PHENERGAN) 25 MG tablet Take 1 tablet (25 mg total) by mouth every 6 (six) hours as needed for nausea. 30 tablet 3  . valsartan-hydrochlorothiazide (DIOVAN-HCT) 320-25 MG tablet Take 0.5 tablets by mouth daily.    . AMBULATORY NON FORMULARY MEDICATION Knee-high, medium compression, graduated compression stockings. Apply to lower extremities. [email or web address].Dreamproducts.com, Zippered Compression  Stockings, medium circ, long length 1 each 0  . azelastine (OPTIVAR) 0.05 % ophthalmic solution Place 2 drops into both eyes 2 (two) times daily. (Patient not taking: Reported on 08/15/2017) 6 mL 11  . folic acid (FOLVITE) 1 MG tablet Take 5 tablets (5 mg total) by mouth daily. (Patient not taking: Reported on 08/15/2017) 150 tablet 11  . gabapentin (NEURONTIN) 100 MG capsule Take 1 capsule (100 mg total) by mouth 3 (three) times daily for 10 days. (Patient not taking: Reported on 08/15/2017) 30 capsule 0  . ondansetron (ZOFRAN-ODT) 8 MG disintegrating tablet Take 1 tablet (8 mg total) by mouth every 8 (eight) hours as needed for nausea. (Patient not taking: Reported on 08/15/2017) 20 tablet 3  . vitamin B-12 (CYANOCOBALAMIN) 1000 MCG tablet Take 1 tablet (1,000 mcg total) by mouth daily. (Patient not taking: Reported on 08/15/2017) 90 tablet 3    ROS:   General:  No weight loss, Fever, chills  HEENT: No recent headaches, no nasal bleeding, no visual changes, no sore throat  Neurologic: No dizziness, blackouts, seizures. No recent symptoms of stroke or mini- stroke. No recent episodes of slurred speech, or temporary blindness.  Cardiac: No recent episodes of chest pain/pressure, no shortness of breath at rest.  + shortness of breath with exertion.  Denies history of atrial fibrillation or irregular heartbeat  Vascular: + history of rest pain in feet.  No history of claudication.  + history of non-healing ulcer, No history of DVT   Pulmonary: No home oxygen, no productive cough, no hemoptysis,  No asthma or wheezing  Musculoskeletal:  [ ]  Arthritis, [ ]  Low back pain,  [ ]  Joint pain  Hematologic:No history of hypercoagulable state.  No history of easy bleeding.  No history of anemia  Gastrointestinal: No hematochezia or melena,  No gastroesophageal reflux, no trouble swallowing  Urinary: [X]  chronic Kidney disease, [ ]  on HD - [ ]  MWF or [ ]  TTHS, [ ]  Burning with urination, [ ]  Frequent  urination, [ ]  Difficulty urinating;   Skin: No rashes  Psychological: No history of anxiety,  No history of depression   Physical Examination  Vitals:   08/15/17 1457 08/15/17 1741 08/15/17 1825  BP: (!) 162/66 (!) 151/76 (!) 151/71  Pulse: 84 85 67  Resp: 20 16 16   Temp: 98.5 F (36.9 C)    TempSrc: Oral    SpO2:  100% 100%    There is no height or weight on file to calculate BMI.  General:  Alert and oriented, no acute distress  HEENT: Normal Neck: No JVD Pulmonary: Clear to auscultation bilaterally Cardiac: Regular Rate and Rhythm  Abdomen: Soft, non-tender, non-distended, no mass, obese Skin: No rash, right foot dependent rubor, gangrene toes 4 5 right foot, dark ulcer over skin crack medial aspect right heel Extremity Pulses:  2+ radial, brachial, femoral, absent popliteal dorsalis pedis, posterior tibial pulses bilaterally Musculoskeletal: No deformity trace pretibial and pedal edema right foot  Neurologic: Upper and lower extremity motor 5/5 and symmetric  DATA:  BMET    Component Value Date/Time   NA 137 08/15/2017 1840   K 4.1 08/15/2017 1840   CL 101 08/15/2017 1840   CO2 25 08/15/2017 1840   GLUCOSE 106 (H) 08/15/2017 1840   BUN 60 (H) 08/15/2017 1840   CREATININE 2.52 (H) 08/15/2017 1840   CREATININE 1.38 (H) 08/09/2017 0948   CALCIUM 9.4 08/15/2017 1840   GFRNONAA 23 (L) 08/15/2017 1840   GFRNONAA 50 (L) 08/09/2017 0948   GFRAA 27 (L) 08/15/2017 1840   GFRAA 58 (L) 08/09/2017 0948    CBC    Component Value Date/Time   WBC 10.6 (H) 08/15/2017 1840   RBC 4.56 08/15/2017 1840   HGB 14.5 08/15/2017 1840   HCT 44.2 08/15/2017 1840   PLT 289 08/15/2017 1840   MCV 96.9 08/15/2017 1840   MCH 31.8 08/15/2017 1840   MCHC 32.8 08/15/2017 1840   RDW 14.2 08/15/2017 1840   LYMPHSABS 0.9 08/15/2017 1840   MONOABS 1.5 (H) 08/15/2017 1840   EOSABS 0.2 08/15/2017 1840   BASOSABS 0.1 08/15/2017 1840   MRI foot, no osteo or abscess DP  patent  ASSESSMENT:  1.  Gangrene right foot with rest pain at risk for limb loss  2.  Acute renal failure   PLAN:  1. Would stop valsartan and hydrate avoid any other nephrotoxic drugs  2.  Tentatively will plan aortogram with bilateral runoff possible intervention on Thursday 3/14 by Dr Imogene Burn if his renal function has improved.  3. Dry gauze between toes right foot  4. ABIs tomorrow  5.  Pt will need transfer to [facility]   Brandon Bruns, MD [facility] Office: [phone] Pager: [phone]

## 2017-08-15 NOTE — Telephone Encounter (Signed)
[name] is a pleasant 77 year old male, I been treating him for an infected right foot, we sent him down to podiatry to look at a possible gangrenous toe, per report he had started to slur his speech, foot looked worse, I did recommend that he be referred straight to the emergency department.

## 2017-08-15 NOTE — ED Provider Notes (Signed)
Medical screening examination/treatment/procedure(s) were conducted as a shared visit with non-physician practitioner(s) and myself.  I personally evaluated the patient during the encounter.  Patient presents to the ED for evaluation of peripheral vascular disease.  Patient has evidence of necrosis in his fourth and fifth toes.  He also has some mild erythema of his midfoot.  Will start on abx.  Spoke with Dr Darrick PennaFields vascular surgery who will see the patient.   Plan on consultation with the medical service for admission considering his acute kidney injury.   Linwood DibblesKnapp, Lachanda Buczek, MD 08/15/17 2017

## 2017-08-15 NOTE — ED Provider Notes (Signed)
[facility]-EMERGENCY DEPT Provider Note   CSN: [id number] Arrival date & time: 08/15/17  1431     History   Chief Complaint Chief Complaint  Patient presents with  . necrotic R 4th, 5th toes    HPI [name]. is a 76 y.o. male with history of hyperlipidemia, tobacco abuse, hypertension, heart failure is here from podiatrist for evaluation of necrotic fourth and fifth toes for 4 weeks, associated with pain and decreased sensation distally. Reports been compliant with oral antibiotics and hydrocodone however her symptoms have been progressively worsening. Son at bedside states that patient has a appointment with vascular surgeon in 2 weeks but podiatrist felt he needed more urgent evaluation. Per podiatrist notes, patient sent to ER for further clinical testing and vascular evaluation. Patient denies fevers, history of diabetes or trauma.  HPI  Past Medical History:  Diagnosis Date  . Carotid artery occlusion   . Colon polyps   . Degenerative arthritis of spine   . Diverticulosis   . Hemorrhoids   . Hemorrhoids   . Hyperlipidemia   . Hypertension    2010  . Sciatica   . Solar keratosis   . Vitamin B12 deficiency     Patient Active Problem List   Diagnosis Date Noted  . Infection of right foot 08/09/2017  . Depressed mood 08/09/2017  . Allergic conjunctivitis 08/23/2016  . 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 Abnormality of gait 01/27/2015  . Chronic diastolic heart failure (HCC) 01/02/2015  . Preventive measure 01/05/2013  . Occlusion and stenosis of carotid artery without mention of cerebral infarction 11/12/2011  . Carotid stenosis 04/16/2011  . Lumbar degenerative disc disease 08/03/2010  . HEMORRHOIDS 03/11/2009  . Hyperlipidemia 01/29/2009  . CIGARETTE SMOKER 10/04/2008  . Essential hypertension, benign 10/04/2008    Past Surgical History:  Procedure Laterality Date  . CAROTID  ENDARTERECTOMY  09/21/10   Right CEA  . NECK MASS EXCISION         Home Medications    Prior to Admission medications   Medication Sig Start Date End Date Taking? Authorizing Provider  amLODipine (NORVASC) 5 MG tablet Take 1 tablet (5 mg total) by mouth daily. 11/02/16  Yes Monica Becton, MD  ciprofloxacin (CIPRO) 750 MG tablet Take 1 tablet (750 mg total) by mouth 2 (two) times daily for 10 days. 08/09/17 08/19/17 Yes Monica Becton, MD  DULoxetine (CYMBALTA) 30 MG capsule Take 1 capsule (30 mg total) by mouth daily. 08/09/17  Yes Monica Becton, MD  ferrous sulfate 325 (65 FE) MG EC tablet Take 1 tablet (325 mg total) by mouth 3 (three) times daily with meals. 02/17/15  Yes Monica Becton, MD  HYDROcodone-acetaminophen (NORCO/VICODIN) 5-325 MG tablet Take 1 tablet by mouth every 8 (eight) hours as needed for moderate pain. 08/09/17  Yes Monica Becton, MD  metoprolol succinate (TOPROL-XL) 50 MG 24 hr tablet TAKE 1 TABLET(25 MG) BY MOUTH DAILY 08/09/17  Yes Monica Becton, MD  metroNIDAZOLE (FLAGYL) 500 MG tablet Take 1 tablet (500 mg total) by mouth 2 (two) times daily for 7 days. 08/09/17 08/16/17 Yes Monica Becton, MD  promethazine (PHENERGAN) 25 MG tablet Take 1 tablet (25 mg total) by mouth every 6 (six) hours as needed for nausea. 08/10/17  Yes Monica Becton, MD  valsartan-hydrochlorothiazide (DIOVAN-HCT) 320-25 MG tablet Take 0.5 tablets by mouth daily. 08/12/17  Yes Monica Becton, MD  AMBULATORY NON  FORMULARY MEDICATION Knee-high, medium compression, graduated compression stockings. Apply to lower extremities. [email or web address], Zippered Compression Stockings, medium circ, long length 01/27/15   Monica Becton, MD  azelastine (OPTIVAR) 0.05 % ophthalmic solution Place 2 drops into both eyes 2 (two) times daily. Patient not taking: Reported on 08/15/2017 08/23/16   Monica Becton, MD  folic acid (FOLVITE) 1 MG  tablet Take 5 tablets (5 mg total) by mouth daily. Patient not taking: Reported on 08/15/2017 02/21/15   Monica Becton, MD  gabapentin (NEURONTIN) 100 MG capsule Take 1 capsule (100 mg total) by mouth 3 (three) times daily for 10 days. Patient not taking: Reported on 08/15/2017 08/04/17 08/14/17  Wieters, Hallie C, PA-C  ondansetron (ZOFRAN-ODT) 8 MG disintegrating tablet Take 1 tablet (8 mg total) by mouth every 8 (eight) hours as needed for nausea. Patient not taking: Reported on 08/15/2017 08/10/17   Monica Becton, MD  vitamin B-12 (CYANOCOBALAMIN) 1000 MCG tablet Take 1 tablet (1,000 mcg total) by mouth daily. Patient not taking: Reported on 08/15/2017 06/16/15   Monica Becton, MD    Family History Family History  Problem Relation Age of Onset  . Dementia Mother   . Heart attack Father        First MI age 71.  Marland Kitchen Heart disease Father 58  . Heart attack Brother        CAD, prior MI  . Hypertension Brother   . ALS Sister     Social History Social History   Tobacco Use  . Smoking status: Former Smoker    Packs/day: 1.00    Years: 50.00    Pack years: 50.00    Types: Cigarettes    Last attempt to quit: 10/14/2010    Years since quitting: 6.8  . Smokeless tobacco: Never Used  Substance Use Topics  . Alcohol use: Yes    Alcohol/week: 0.0 oz    Comment: Patient drinks 3 beers daily  . Drug use: No     Allergies   Pollen extract   Review of Systems Review of Systems  Musculoskeletal: Positive for arthralgias.  Skin: Positive for color change.  All other systems reviewed and are negative.    Physical Exam Updated Vital Signs BP (!) 151/71   Pulse 67   Temp 98.5 F (36.9 C) (Oral)   Resp 16   SpO2 100%   Physical Exam  Constitutional: He is oriented to person, place, and time. He appears well-developed and well-nourished. No distress.  Non toxic.  HENT:  Head: Normocephalic and atraumatic.  Nose: Nose normal.  Mouth/Throat: No oropharyngeal  exudate.  Moist mucous membranes   Eyes: Conjunctivae and EOM are normal. Pupils are equal, round, and reactive to light.  Neck: Normal range of motion.  Cardiovascular: Normal rate, regular rhythm, normal heart sounds and intact distal pulses.  No murmur heard. 2+ radial pulses bilaterally. Dopplerable R DP and PT pulses, 4th and 5th toes are necrotic and cold, mild edema to right mid foot. 1+ L DP and PT pulses.   Pulmonary/Chest: Effort normal and breath sounds normal.  Abdominal: Soft. Bowel sounds are normal. There is no tenderness.  No G/R/R. No suprapubic or CVA tenderness.   Musculoskeletal: Normal range of motion. He exhibits no deformity.  Neurological: He is alert and oriented to person, place, and time.  Skin: Skin is warm and dry. Capillary refill takes less than 2 seconds.  Tender, necrotic 4th and 5th right toes. Mild mid foot edema. See picture.  Psychiatric: He has a normal mood and affect. His behavior is normal. Judgment and thought content normal.  Nursing note and vitals reviewed.    ED Treatments / Results  Labs (all labs ordered are listed, but only abnormal results are displayed) Labs Reviewed  CBC WITH DIFFERENTIAL/PLATELET - Abnormal; Notable for the following components:      Result Value   WBC 10.6 (*)    Neutro Abs 8.0 (*)    Monocytes Absolute 1.5 (*)    All other components within normal limits  BASIC METABOLIC PANEL - Abnormal; Notable for the following components:   Glucose, Bld 106 (*)    BUN 60 (*)    Creatinine, Ser 2.52 (*)    GFR calc non Af Amer 23 (*)    GFR calc Af Amer 27 (*)    All other components within normal limits  CULTURE, BLOOD (ROUTINE X 2)  CULTURE, BLOOD (ROUTINE X 2)  I-STAT CG4 LACTIC ACID, ED  I-STAT CG4 LACTIC ACID, ED    EKG  EKG Interpretation None       Radiology Dg Foot Complete Right  Result Date: 08/15/2017 CLINICAL DATA:  Necrotic fourth toe for several weeks and fifth toe over the past 2-3 days  EXAM: RIGHT FOOT COMPLETE - 3+ VIEW COMPARISON:  MRI 08/09/2017 FINDINGS: Soft tissue wasting of the fourth and fifth digits of the right foot. No cortical bone destruction however is noted however of the fourth and fifth toes. No acute fracture nor suspicious osseous lesions. Bony sequestra. Joint spaces are maintained apart from some mild degenerative joint space narrowing of the DIP and PIP joints of the second fifth digits. No soft tissue emphysema or mass. IMPRESSION: Mild soft tissue wasting of the fourth and fifth digits. No frank bone destruction or fracture. Electronically Signed   By: Tollie Eth M.D.   On: 08/15/2017 18:40    Procedures Procedures (including critical care time)  Medications Ordered in ED Medications  vancomycin (VANCOCIN) IVPB 1000 mg/200 mL premix (not administered)  sodium chloride 0.9 % bolus 1,000 mL (1,000 mLs Intravenous New Bag/Given 08/15/17 1844)  morphine 4 MG/ML injection 4 mg (4 mg Intravenous Given 08/15/17 1844)     Initial Impression / Assessment and Plan / ED Course  I have reviewed the triage vital signs and the nursing notes.  Pertinent labs & imaging results that were available during my care of the patient were reviewed by me and considered in my medical decision making (see chart for details).  Clinical Course as of Aug 15 2017  Mon Aug 15, 2017  1929 WBC: (!) 10.6 [CG]  1929 Creatinine: (!) 2.52 [CG]  1929 GFR, Est Non African American: (!) 23 [CG]  1929  IMPRESSION: Mild soft tissue wasting of the fourth and fifth digits. No frank bone destruction or fracture. DG Foot Complete Right [CG]    Clinical Course User Index [CG] Liberty Handy, PA-C   76 yo male with h/o PVD, HTN, continued tobacco abuse, here for worsening necrotic right 4th and 5th toes. Sent to ED by podiatrist for more urgent evaluation. Per chart review, pt has had MRI without evidence of osteomyelitis. MRA showed patency of DP and PT arteries but did not visualize  patency of digital arteries due to artifact. Has been on cipro, flagyl, doxy and compliant. Will obtain screening labs and x-ray, control pain. Pt feels like he has cotton mouth, decreased PO intake in last few days.   Labs remarkable for mild leukocytosis  10.6 and AKI creatinine 2.52, BUN 60, GFR 23. X-ray non contributory. Will consult vascular surgery.   Final Clinical Impressions(s) / ED Diagnoses   2015: Dr Darrick PennaFields (vascular surgery) [facility], he will see patient tonight. Discussed plan with patient and family. Will start abx given midfoot erythema and warm.   Final diagnoses:  Peripheral vascular disease [facility](HCC)    ED Discharge Orders    None       Jerrell MylarGibbons, Kelsie Zaborowski J, PA-C 08/15/17 2019    Linwood DibblesKnapp, Jon, MD 08/15/17 [phone]

## 2017-08-16 ENCOUNTER — Inpatient Hospital Stay (HOSPITAL_COMMUNITY): Payer: Medicare Other

## 2017-08-16 ENCOUNTER — Encounter (HOSPITAL_COMMUNITY): Payer: Self-pay | Admitting: Internal Medicine

## 2017-08-16 DIAGNOSIS — Z9889 Other specified postprocedural states: Secondary | ICD-10-CM

## 2017-08-16 DIAGNOSIS — N179 Acute kidney failure, unspecified: Secondary | ICD-10-CM | POA: Diagnosis present

## 2017-08-16 LAB — URINALYSIS, ROUTINE W REFLEX MICROSCOPIC
BILIRUBIN URINE: NEGATIVE
Glucose, UA: NEGATIVE mg/dL
Hgb urine dipstick: NEGATIVE
KETONES UR: NEGATIVE mg/dL
Leukocytes, UA: NEGATIVE
NITRITE: NEGATIVE
PH: 5 (ref 5.0–8.0)
Protein, ur: NEGATIVE mg/dL
Specific Gravity, Urine: 1.019 (ref 1.005–1.030)

## 2017-08-16 LAB — SODIUM, URINE, RANDOM: SODIUM UR: 86 mmol/L

## 2017-08-16 LAB — CREATININE, URINE, RANDOM: Creatinine, Urine: 146.07 mg/dL

## 2017-08-16 MED ORDER — GABAPENTIN 100 MG PO CAPS
100.0000 mg | ORAL_CAPSULE | Freq: Three times a day (TID) | ORAL | Status: DC
  Administered 2017-08-16 – 2017-08-26 (×29): 100 mg via ORAL
  Filled 2017-08-16 (×29): qty 1

## 2017-08-16 MED ORDER — ONDANSETRON HCL 4 MG PO TABS: 4.0000 mg | ORAL_TABLET | Freq: Four times a day (QID) | ORAL | Status: DC | PRN

## 2017-08-16 MED ORDER — DULOXETINE HCL 30 MG PO CPEP
30.0000 mg | ORAL_CAPSULE | Freq: Every day | ORAL | Status: DC
  Administered 2017-08-16 – 2017-08-26 (×10): 30 mg via ORAL
  Filled 2017-08-16 (×10): qty 1

## 2017-08-16 MED ORDER — ONDANSETRON HCL 4 MG/2ML IJ SOLN: 4.0000 mg | Freq: Four times a day (QID) | INTRAMUSCULAR | Status: DC | PRN

## 2017-08-16 MED ORDER — PIPERACILLIN-TAZOBACTAM 3.375 G IVPB
3.3750 g | Freq: Three times a day (TID) | INTRAVENOUS | Status: DC
  Administered 2017-08-16 – 2017-08-26 (×28): 3.375 g via INTRAVENOUS
  Filled 2017-08-16 (×33): qty 50

## 2017-08-16 MED ORDER — METOPROLOL SUCCINATE ER 50 MG PO TB24
50.0000 mg | ORAL_TABLET | Freq: Every day | ORAL | Status: DC
  Administered 2017-08-16 – 2017-08-19 (×3): 50 mg via ORAL
  Filled 2017-08-16 (×4): qty 1

## 2017-08-16 MED ORDER — ACETAMINOPHEN 650 MG RE SUPP: 650.0000 mg | Freq: Four times a day (QID) | RECTAL | Status: DC | PRN

## 2017-08-16 MED ORDER — MORPHINE SULFATE (PF) 2 MG/ML IV SOLN
1.0000 mg | INTRAVENOUS | Status: DC | PRN
  Administered 2017-08-16 – 2017-08-18 (×13): 1 mg via INTRAVENOUS
  Filled 2017-08-16 (×12): qty 1

## 2017-08-16 MED ORDER — AMLODIPINE BESYLATE 5 MG PO TABS
5.0000 mg | ORAL_TABLET | Freq: Every day | ORAL | Status: DC
  Administered 2017-08-16 – 2017-08-19 (×3): 5 mg via ORAL
  Filled 2017-08-16 (×3): qty 1

## 2017-08-16 MED ORDER — SODIUM CHLORIDE 0.9 % IV SOLN
INTRAVENOUS | Status: AC
  Administered 2017-08-16: 03:00:00 via INTRAVENOUS

## 2017-08-16 MED ORDER — VANCOMYCIN HCL IN DEXTROSE 1-5 GM/200ML-% IV SOLN: 1000.0000 mg | Freq: Once | INTRAVENOUS | Status: DC

## 2017-08-16 MED ORDER — FOLIC ACID 1 MG PO TABS
5.0000 mg | ORAL_TABLET | Freq: Every day | ORAL | Status: DC
  Administered 2017-08-16 – 2017-08-26 (×9): 5 mg via ORAL
  Filled 2017-08-16 (×9): qty 5

## 2017-08-16 MED ORDER — PIPERACILLIN-TAZOBACTAM 3.375 G IVPB 30 MIN
3.3750 g | Freq: Once | INTRAVENOUS | Status: AC
  Administered 2017-08-16: 3.375 g via INTRAVENOUS
  Filled 2017-08-16: qty 50

## 2017-08-16 MED ORDER — HYDRALAZINE HCL 20 MG/ML IJ SOLN
10.0000 mg | INTRAMUSCULAR | Status: DC | PRN
  Administered 2017-08-16 – 2017-08-18 (×2): 10 mg via INTRAVENOUS
  Filled 2017-08-16: qty 1

## 2017-08-16 MED ORDER — VANCOMYCIN HCL IN DEXTROSE 1-5 GM/200ML-% IV SOLN
1000.0000 mg | Freq: Once | INTRAVENOUS | Status: AC
  Administered 2017-08-16: 1000 mg via INTRAVENOUS
  Filled 2017-08-16: qty 200

## 2017-08-16 MED ORDER — ACETAMINOPHEN 325 MG PO TABS: 650.0000 mg | ORAL_TABLET | Freq: Four times a day (QID) | ORAL | Status: DC | PRN

## 2017-08-16 MED ORDER — FERROUS SULFATE 325 (65 FE) MG PO TABS
325.0000 mg | ORAL_TABLET | Freq: Three times a day (TID) | ORAL | Status: DC
  Administered 2017-08-16 – 2017-08-26 (×25): 325 mg via ORAL
  Filled 2017-08-16 (×27): qty 1

## 2017-08-16 MED ORDER — VANCOMYCIN HCL IN DEXTROSE 1-5 GM/200ML-% IV SOLN
1000.0000 mg | INTRAVENOUS | Status: DC
  Administered 2017-08-16: 1000 mg via INTRAVENOUS
  Filled 2017-08-16: qty 200

## 2017-08-16 MED ORDER — VITAMIN B-12 1000 MCG PO TABS
1000.0000 ug | ORAL_TABLET | Freq: Every day | ORAL | Status: DC
  Administered 2017-08-16 – 2017-08-26 (×10): 1000 ug via ORAL
  Filled 2017-08-16 (×10): qty 1

## 2017-08-16 MED ORDER — PROMETHAZINE HCL 25 MG PO TABS: 25.0000 mg | ORAL_TABLET | Freq: Four times a day (QID) | ORAL | Status: DC | PRN

## 2017-08-16 NOTE — ED Notes (Signed)
[facility] at bedside ready for transport at this time.

## 2017-08-16 NOTE — Progress Notes (Signed)
Hospitalist progress note   [name].  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  Followed by neurology Left heart cath 09/2010-  Admit 08/16/2017 right foot pain increased pain on walking MRI did not show any involvement of bone Vascular surgery consulted-was supposed to have arteriogram performed 3/14 Found to be in acute renal failure baseline creatinine 1.3-1.6 on admission 60/2.5, Mild leukocytosis of 10  Assessment & Plan:   Assessment:  The encounter diagnosis was Peripheral vascular disease (HCC).  Gangrene of right foot-defer to vascular surgeon, ABI pending empirically covering with vancomycin and Zosyn at this time  Acute kidney injury-holding Diovan HCT not on any other diuretics.  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, folic acid deficiency-continue same in addition to gabapentin 100 3 times daily  Diabetes mellitus type 2-does not appear to be on meds at this time--he doesn't have this diagnosis according to him  Iron deficiency anemia continue 325 mg 3 times daily  DVT prophylaxis: lovenox  Code Status:   full   Family Communication:    sistersa t bedside   Disposition Plan: IP   Consultants:   vasc  Procedures:   none  Antimicrobials:    vacn/zosyn 3/12   Subjective:   Awake alert some pain in LE No distress, no n/v No fever   Objective: Vitals:   08/16/17 0100 08/16/17 0400 08/16/17 0453 08/16/17 0533  BP: (!) 178/86  (!) 169/68 (!) 149/48  Pulse: 91  61 61  Resp:   18   Temp: 98.6 F (37 C)  97.9 F (36.6 C)    TempSrc: Oral  Oral   SpO2: 98%  98%   Weight:  88.2 kg (194 lb 8 oz)    Height: 5\' 11"  (1.803 m) 5\' 11"  (1.803 m)      Intake/Output Summary (Last 24 hours) at 08/16/2017 0749 Last data filed at 08/16/2017 0600 Gross per 24 hour  Intake 1320 ml  Output 300 ml  Net 1020 ml   Filed Weights   08/16/17 0400  Weight: 88.2 kg (194 lb 8 oz)    Examination:  eomi ncat  cta b abd soft woudn not examined s1 s2 no m/r/g  Data Reviewed: I have personally reviewed following labs and imaging studies  CBC: Recent Labs  Lab 08/09/17 0944 08/15/17 1840  WBC 9.6 10.6*  NEUTROABS 7,229 8.0*  HGB 14.2 14.5  HCT 41.4 44.2  MCV 92.6 96.9  PLT 247 289   Basic Metabolic Panel: Recent Labs  Lab 08/09/17 0948 08/15/17 1840  NA 136 137  K 4.4 4.1  CL 100 101  CO2 28 25  GLUCOSE 102* 106*  BUN 24 60*  CREATININE 1.38* 2.52*  CALCIUM 9.5 9.4   GFR: Estimated Creatinine Clearance: 27 mL/min (A) (by C-G formula based on SCr of 2.52 mg/dL (H)). Liver Function Tests: Recent Labs  Lab 08/09/17 0948  AST 16  ALT 12  BILITOT 0.6  PROT 7.3   No results for input(s): LIPASE, AMYLASE in the last 168 hours. No results for input(s): AMMONIA in the last 168 hours. Coagulation Profile: No results for  input(s): INR, PROTIME in the last 168 hours. Cardiac Enzymes: No results for input(s): CKTOTAL, CKMB, CKMBINDEX, TROPONINI in the last 168 hours. CBG: No results for input(s): GLUCAP in the last 168 hours. Urine analysis:    Component Value Date/Time   COLORURINE YELLOW 08/16/2017 0458   APPEARANCEUR CLEAR 08/16/2017 0458   LABSPEC 1.019 08/16/2017 0458   PHURINE 5.0 08/16/2017 0458   GLUCOSEU NEGATIVE 08/16/2017 0458   HGBUR NEGATIVE 08/16/2017 0458   BILIRUBINUR NEGATIVE 08/16/2017 0458   KETONESUR NEGATIVE 08/16/2017 0458   PROTEINUR NEGATIVE 08/16/2017 0458   UROBILINOGEN 0.2 09/17/2010 1354   NITRITE NEGATIVE 08/16/2017 0458   LEUKOCYTESUR NEGATIVE 08/16/2017 0458      Radiology Studies: Reviewed images personally in health database    Scheduled Meds: . amLODipine  5 mg Oral Daily  . DULoxetine  30 mg Oral Daily  . ferrous sulfate  325 mg Oral TID WC  . metoprolol succinate  50 mg Oral Daily   Continuous Infusions: . sodium chloride 125 mL/hr at 08/16/17 0259  . piperacillin-tazobactam (ZOSYN)  IV    . vancomycin       LOS: 1 day    Time spent: 3025    Pleas KochJai Kristyl Athens, MD [facility](P) [phone]   If 7PM-7AM, please contact night-coverage [email or web address] Password [facility] 08/16/2017, 7:49 AM

## 2017-08-16 NOTE — Progress Notes (Signed)
   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 intervention on Thurs if AKI resolved  Subjective    C/o pain in R foot   Objective   Vitals:   08/16/17 0100 08/16/17 0400 08/16/17 0453 08/16/17 0533  BP: (!) 178/86  (!) 169/68 (!) 149/48  Pulse: 91  61 61  Resp:   18   Temp: 98.6 F (37 C)  97.9 F (36.6 C)   TempSrc: Oral  Oral   SpO2: 98%  98%   Weight:  194 lb 8 oz (88.2 kg)    Height: 5\' 11"  (1.803 m) 5\' 11"  (1.803 m)       Intake/Output Summary (Last 24 hours) at 08/16/2017 0852 Last data filed at 08/16/2017 0600 Gross per 24 hour  Intake 1320 ml  Output 300 ml  Net 1020 ml    VASC R 4-5th toe dry gangrene, TTP to light touch dorsum of right foot  NEURO DF/PF intact R foot    Laboratory   CBC CBC Latest Ref Rng & Units 08/15/2017 08/09/2017 07/31/2017  WBC 4.0 - 10.5 K/uL 10.6(H) 9.6 -  Hemoglobin 13.0 - 17.0 g/dL 16.114.5 09.614.2 04.515.6  Hematocrit 39.0 - 52.0 % 44.2 41.4 46.0  Platelets 150 - 400 K/uL 289 247 -    BMET    Component Value Date/Time   NA 137 08/15/2017 1840   K 4.1 08/15/2017 1840   CL 101 08/15/2017 1840   CO2 25 08/15/2017 1840   GLUCOSE 106 (H) 08/15/2017 1840   BUN 60 (H) 08/15/2017 1840   CREATININE 2.52 (H) 08/15/2017 1840   CREATININE 1.38 (H) 08/09/2017 0948   CALCIUM 9.4 08/15/2017 1840   GFRNONAA 23 (L) 08/15/2017 1840   GFRNONAA 50 (L) 08/09/2017 0948   GFRAA 27 (L) 08/15/2017 1840   GFRAA 58 (L) 08/09/2017 [id number]     Leonides SakeBrian Chen, MD, FACS [facility] [place] Office: [phone] Pager: [phone]  08/16/2017, 8:52 AM

## 2017-08-16 NOTE — Plan of Care (Signed)
  Progressing Education: Knowledge of General Education information will improve 08/16/2017 1628 - Progressing by Darreld Mcleanox, Reznor Ferrando, RN Health Behavior/Discharge Planning: Ability to manage health-related needs will improve 08/16/2017 1628 - Progressing by Darreld Mcleanox, Dariah Mcsorley, RN Clinical Measurements: Ability to maintain clinical measurements within normal limits will improve 08/16/2017 1628 - Progressing by Darreld Mcleanox, Stanisha Lorenz, RN Will remain free from infection 08/16/2017 1628 - Progressing by Darreld Mcleanox, Jaryan Chicoine, RN Diagnostic test results will improve 08/16/2017 1628 - Progressing by Darreld Mcleanox, Juanelle Trueheart, RN Respiratory complications will improve 08/16/2017 1628 - Progressing by Darreld Mcleanox, Vaibhav Fogleman, RN Cardiovascular complication will be avoided 08/16/2017 1628 - Progressing by Darreld Mcleanox, Jahnyla Parrillo, RN Activity: Risk for activity intolerance will decrease 08/16/2017 1628 - Progressing by Darreld Mcleanox, Sharise Lippy, RN Nutrition: Adequate nutrition will be maintained 08/16/2017 1628 - Progressing by Darreld Mcleanox, Ranetta Armacost, RN Coping: Level of anxiety will decrease 08/16/2017 1628 - Progressing by Darreld Mcleanox, Eriel Doyon, RN Elimination: Will not experience complications related to bowel motility 08/16/2017 1628 - Progressing by Darreld Mcleanox, Lyndsy Gilberto, RN Will not experience complications related to urinary retention 08/16/2017 1628 - Progressing by Darreld Mcleanox, Shima Compere, RN Pain Managment: General experience of comfort will improve 08/16/2017 1628 - Progressing by Darreld Mcleanox, Apollonia Amini, RN Safety: Ability to remain free from injury will improve 08/16/2017 1628 - Progressing by Darreld Mcleanox, Tanecia Mccay, RN Skin Integrity: Risk for impaired skin integrity will decrease 08/16/2017 1628 - Progressing by Darreld Mcleanox, Shaheed Schmuck, RN

## 2017-08-16 NOTE — ED Notes (Signed)
[facility] called at this time.  

## 2017-08-16 NOTE — Progress Notes (Addendum)
Pharmacy Antibiotic Note  Brandon Boresroy Bold Jr. is a 77 y.o. male admitted on 08/15/2017 with R foot pain thought to be gangrene. Starting empiric abx. Normalized eCrCl 20-30 ml/min. [facility] Will give an additional 1 g to complete appropriate load.  Plan: -Vancomycin 1 g IV x1 then 1 g IV q24h -Zosyn 3.375 g IV q8h -Monitor renal fx, cultures, VT at steady state  Height: 5\' 11"  (180.3 cm) IBW/kg (Calculated) : 75.3  Temp (24hrs), Avg:98.7 F (37.1 C), Min:98.5 F (36.9 C), Max:99 F (37.2 C)  Recent Labs  Lab 08/09/17 0944 08/09/17 0948 08/15/17 1840 08/15/17 1854  WBC 9.6  --  10.6*  --   CREATININE  --  1.38* 2.52*  --   LATICACIDVEN  --   --   --  1.39    CrCl cannot be calculated (Unknown ideal weight.).    Allergies  Allergen Reactions  . Pollen Extract     Antimicrobials this admission: 3/12 vancomycin > 3/12 zosyn >  Dose adjustments this admission: N/A  Microbiology results: 3/11 blood cx:  [name], [name] 08/16/2017 3:40 AM

## 2017-08-16 NOTE — H&P (Signed)
History and Physical    [name]. ZOX:[id number] DOB: 01/06/1941 DOA: 08/15/2017  PCP: Monica Becton, MD  Patient coming from: Home.  Chief Complaint: Right foot fourth and fifth toe gangrene.  HPI: [name]. is a 77 y.o. male with history of hypertension, carotid endarterectomy and ongoing tobacco abuse has been experiencing increasing pain and swelling in the right foot for the last 3-4 weeks.  Patient also has increasing pain on walking.  Had gone to his PCP last week and had MRI of the foot done which did not show any bony involvement.  Did show some edema.  Patient was placed on antibiotics.  Since there was discoloration of the toes patient was referred to podiatrist who referred to the ER with concerns for gangrene involving the lateral fourth and fifth toes.  ED Course: On exam patient has swelling of the right foot with dopplerable pulse.  Mild erythema on the dorsum of right foot.  Patient's creatinine has worsened from last week patient states he has been having some nausea and poor appetite since starting antibiotics.  Denies any diarrhea.  On-call vascular surgeon Dr. Darrick Penna was consulted.  Patient is being admitted for further management of gangrenous toe and possible cellulitis involving the right foot.  Review of Systems: As per HPI, rest all negative.   Past Medical History:  Diagnosis Date  . Carotid artery occlusion   . Colon polyps   . Degenerative arthritis of spine   . Diverticulosis   . Hemorrhoids   . Hemorrhoids   . Hyperlipidemia   . Hypertension    2010  . Sciatica   . Solar keratosis   . Vitamin B12 deficiency     Past Surgical History:  Procedure Laterality Date  . CAROTID ENDARTERECTOMY  09/21/10   Right CEA  . NECK MASS EXCISION       reports that he quit smoking about 6 years ago. His smoking use included cigarettes. He has a 50.00 pack-year smoking history. he has never used smokeless tobacco. He reports that he drinks alcohol.  He reports that he does not use drugs.  Allergies  Allergen Reactions  . Pollen Extract     Family History  Problem Relation Age of Onset  . Dementia Mother   . Heart attack Father        First MI age 69.  Marland Kitchen Heart disease Father 15  . Heart attack Brother        CAD, prior MI  . Hypertension Brother   . ALS Sister     Prior to Admission medications   Medication Sig Start Date End Date Taking? Authorizing Provider  amLODipine (NORVASC) 5 MG tablet Take 1 tablet (5 mg total) by mouth daily. 11/02/16  Yes Monica Becton, MD  ciprofloxacin (CIPRO) 750 MG tablet Take 1 tablet (750 mg total) by mouth 2 (two) times daily for 10 days. 08/09/17 08/19/17 Yes Monica Becton, MD  DULoxetine (CYMBALTA) 30 MG capsule Take 1 capsule (30 mg total) by mouth daily. 08/09/17  Yes Monica Becton, MD  ferrous sulfate 325 (65 FE) MG EC tablet Take 1 tablet (325 mg total) by mouth 3 (three) times daily with meals. 02/17/15  Yes Monica Becton, MD  HYDROcodone-acetaminophen (NORCO/VICODIN) 5-325 MG tablet Take 1 tablet by mouth every 8 (eight) hours as needed for moderate pain. 08/09/17  Yes Monica Becton, MD  metoprolol succinate (TOPROL-XL) 50 MG 24 hr tablet TAKE 1 TABLET(25 MG) BY MOUTH  DAILY 08/09/17  Yes Monica Becton, MD  metroNIDAZOLE (FLAGYL) 500 MG tablet Take 1 tablet (500 mg total) by mouth 2 (two) times daily for 7 days. 08/09/17 08/16/17 Yes Monica Becton, MD  promethazine (PHENERGAN) 25 MG tablet Take 1 tablet (25 mg total) by mouth every 6 (six) hours as needed for nausea. 08/10/17  Yes Monica Becton, MD  valsartan-hydrochlorothiazide (DIOVAN-HCT) 320-25 MG tablet Take 0.5 tablets by mouth daily. 08/12/17  Yes Monica Becton, MD  AMBULATORY NON FORMULARY MEDICATION Knee-high, medium compression, graduated compression stockings. Apply to lower extremities. [email or web address], Zippered Compression Stockings, medium circ, long length  01/27/15   Monica Becton, MD  azelastine (OPTIVAR) 0.05 % ophthalmic solution Place 2 drops into both eyes 2 (two) times daily. Patient not taking: Reported on 08/15/2017 08/23/16   Monica Becton, MD  folic acid (FOLVITE) 1 MG tablet Take 5 tablets (5 mg total) by mouth daily. Patient not taking: Reported on 08/15/2017 02/21/15   Monica Becton, MD  gabapentin (NEURONTIN) 100 MG capsule Take 1 capsule (100 mg total) by mouth 3 (three) times daily for 10 days. Patient not taking: Reported on 08/15/2017 08/04/17 08/14/17  Wieters, Hallie C, PA-C  ondansetron (ZOFRAN-ODT) 8 MG disintegrating tablet Take 1 tablet (8 mg total) by mouth every 8 (eight) hours as needed for nausea. Patient not taking: Reported on 08/15/2017 08/10/17   Monica Becton, MD  vitamin B-12 (CYANOCOBALAMIN) 1000 MCG tablet Take 1 tablet (1,000 mcg total) by mouth daily. Patient not taking: Reported on 08/15/2017 06/16/15   Monica Becton, MD    Physical Exam: Vitals:   08/15/17 1457 08/15/17 1741 08/15/17 1825 08/15/17 2210  BP: (!) 162/66 (!) 151/76 (!) 151/71 (!) 148/67  Pulse: 84 85 67 67  Resp: 20 16 16 18   Temp: 98.5 F (36.9 C)   99 F (37.2 C)  TempSrc: Oral   Oral  SpO2:  100% 100% 94%      Constitutional: Moderately built and nourished. Vitals:   08/15/17 1457 08/15/17 1741 08/15/17 1825 08/15/17 2210  BP: (!) 162/66 (!) 151/76 (!) 151/71 (!) 148/67  Pulse: 84 85 67 67  Resp: 20 16 16 18   Temp: 98.5 F (36.9 C)   99 F (37.2 C)  TempSrc: Oral   Oral  SpO2:  100% 100% 94%   Eyes: Anicteric no pallor. ENMT: No discharge from the ears eyes nose or mouth. Neck: No mass felt.  No neck rigidity. Respiratory: No rhonchi or crepitations. Cardiovascular: S1-S2 heard no murmurs appreciated. Abdomen: Soft nontender bowel sounds present. Musculoskeletal: Swelling of the right foot with gangrenous toe involving the fourth and fifth. Skin: Gangrenous toe fourth and fifth of the  right foot. Neurologic: Alert awake oriented to time place and person.  Moves all extremities. Psychiatric: Appears normal.  Normal affect.   Labs on Admission: I have personally reviewed following labs and imaging studies  CBC: Recent Labs  Lab 08/09/17 0944 08/15/17 1840  WBC 9.6 10.6*  NEUTROABS 7,229 8.0*  HGB 14.2 14.5  HCT 41.4 44.2  MCV 92.6 96.9  PLT 247 289   Basic Metabolic Panel: Recent Labs  Lab 08/09/17 0948 08/15/17 1840  NA 136 137  K 4.4 4.1  CL 100 101  CO2 28 25  GLUCOSE 102* 106*  BUN 24 60*  CREATININE 1.38* 2.52*  CALCIUM 9.5 9.4   GFR: CrCl cannot be calculated (Unknown ideal weight.). Liver Function Tests: Recent Labs  Lab 08/09/17 [phone]  AST 16  ALT 12  BILITOT 0.6  PROT 7.3   No results for input(s): LIPASE, AMYLASE in the last 168 hours. No results for input(s): AMMONIA in the last 168 hours. Coagulation Profile: No results for input(s): INR, PROTIME in the last 168 hours. Cardiac Enzymes: No results for input(s): CKTOTAL, CKMB, CKMBINDEX, TROPONINI in the last 168 hours. BNP (last 3 results) No results for input(s): PROBNP in the last 8760 hours. HbA1C: No results for input(s): HGBA1C in the last 72 hours. CBG: No results for input(s): GLUCAP in the last 168 hours. Lipid Profile: No results for input(s): CHOL, HDL, LDLCALC, TRIG, CHOLHDL, LDLDIRECT in the last 72 hours. Thyroid Function Tests: No results for input(s): TSH, T4TOTAL, FREET4, T3FREE, THYROIDAB in the last 72 hours. Anemia Panel: No results for input(s): VITAMINB12, FOLATE, FERRITIN, TIBC, IRON, RETICCTPCT in the last 72 hours. Urine analysis:    Component Value Date/Time   COLORURINE AMBER BIOCHEMICALS MAY BE AFFECTED BY COLOR (A) 09/17/2010 1354   APPEARANCEUR CLEAR 09/17/2010 1354   LABSPEC 1.026 09/17/2010 1354   PHURINE 5.0 09/17/2010 1354   GLUCOSEU NEGATIVE 09/17/2010 1354   HGBUR NEGATIVE 09/17/2010 1354   BILIRUBINUR SMALL (A) 09/17/2010 1354    KETONESUR 15 (A) 09/17/2010 1354   PROTEINUR NEGATIVE 09/17/2010 1354   UROBILINOGEN 0.2 09/17/2010 1354   NITRITE NEGATIVE 09/17/2010 1354   LEUKOCYTESUR  09/17/2010 1354    NEGATIVE MICROSCOPIC NOT DONE ON URINES WITH NEGATIVE PROTEIN, BLOOD, LEUKOCYTES, NITRITE, OR GLUCOSE <1000 mg/dL.   Sepsis Labs: @LABRCNTIP (procalcitonin:4,lacticidven:4) )No results found for this or any previous visit (from the past 240 hour(s)).   Radiological Exams on Admission: Dg Foot Complete Right  Result Date: 08/15/2017 CLINICAL DATA:  Necrotic fourth toe for several weeks and fifth toe over the past 2-3 days EXAM: RIGHT FOOT COMPLETE - 3+ VIEW COMPARISON:  MRI 08/09/2017 FINDINGS: Soft tissue wasting of the fourth and fifth digits of the right foot. No cortical bone destruction however is noted however of the fourth and fifth toes. No acute fracture nor suspicious osseous lesions. Bony sequestra. Joint spaces are maintained apart from some mild degenerative joint space narrowing of the DIP and PIP joints of the second fifth digits. No soft tissue emphysema or mass. IMPRESSION: Mild soft tissue wasting of the fourth and fifth digits. No frank bone destruction or fracture. Electronically Signed   By: Tollie Eth M.D.   On: 08/15/2017 18:40     Assessment/Plan Principal Problem:   Ischemic foot Active Problems:   CIGARETTE SMOKER   Essential hypertension, benign   Chronic diastolic heart failure (HCC)   ARF (acute renal failure) (HCC)    1. Gangrenous fourth and fifth toe of the right foot -discussed with Dr. Darrick Penna on-call vascular surgeon was planning to do arteriogram 2 days from now since patient will need to get hydrated for acute renal failure.  Patient is on empiric antibiotics for possible cellulitis. 2. Acute renal failure -likely prerenal in the setting of patient not eating well since patient was started on antibiotics.  We will gently hydrate hold ARB and diuretics.  UA and FENa are  pending. 3. Hypertension -since patient has acute renal failure ARB and diuretics on hold.  Patient will continue on metoprolol and amlodipine and PRN IV hydralazine. 4. Tobacco abuse -tobacco cessation counseling requested.  EKG is pending.   DVT prophylaxis: SCDs. Code Status: Full code. Family Communication: Patient's son. Disposition Plan: Home. Consults called: Vascular surgery. Admission status: Inpatient.   Meryle Ready  Toniann FailKakrakandy MD [facility] Pager [phone].  If 7PM-7AM, please contact night-coverage [email or web address] Password [email or web address]  08/16/2017, 12:15 AM

## 2017-08-16 NOTE — Progress Notes (Signed)
VASCULAR LAB PRELIMINARY  ARTERIAL  ABI completed:    RIGHT    LEFT    PRESSURE WAVEFORM  PRESSURE WAVEFORM  BRACHIAL Unable to obtain due to IV location Triphasic BRACHIAL 147 Triphasic  DP 54 Monophasic DP 119 Monophasic  AT   AT    PT 37 Monophasic PT 102 Monophasic  PER   PER    GREAT TOE  NA GREAT TOE  NA    RIGHT LEFT  ABI 0.37 0.81    Right ABI is suggestive of severe arterial insufficiency at rest. Left ABI is suggestive of mild arterial insufficiency at rest.  Preliminary results discussed with Dr. Mahala MenghiniSamtani.  08/16/2017 5:48 PM Gertie FeyMichelle Nikko Goldwire, BS, RVT, RDCS, RDMS

## 2017-08-17 ENCOUNTER — Inpatient Hospital Stay (HOSPITAL_COMMUNITY): Payer: Medicare Other

## 2017-08-17 DIAGNOSIS — Z01810 Encounter for preprocedural cardiovascular examination: Secondary | ICD-10-CM

## 2017-08-17 LAB — RENAL FUNCTION PANEL
Albumin: 2.5 g/dL — ABNORMAL LOW (ref 3.5–5.0)
Anion gap: 9 (ref 5–15)
BUN: 35 mg/dL — AB (ref 6–20)
CHLORIDE: 105 mmol/L (ref 101–111)
CO2: 22 mmol/L (ref 22–32)
Calcium: 8.6 mg/dL — ABNORMAL LOW (ref 8.9–10.3)
Creatinine, Ser: 1.58 mg/dL — ABNORMAL HIGH (ref 0.61–1.24)
GFR, EST AFRICAN AMERICAN: 48 mL/min — AB (ref >60)
GFR, EST NON AFRICAN AMERICAN: 41 mL/min — AB (ref >60)
Glucose, Bld: 96 mg/dL (ref 65–99)
POTASSIUM: 4 mmol/L (ref 3.5–5.1)
Phosphorus: 2.9 mg/dL (ref 2.5–4.6)
Sodium: 136 mmol/L (ref 135–145)

## 2017-08-17 MED ORDER — PRO-STAT SUGAR FREE PO LIQD
30.0000 mL | Freq: Every day | ORAL | Status: DC
  Administered 2017-08-17 – 2017-08-24 (×2): 30 mL via ORAL
  Filled 2017-08-17 (×4): qty 30

## 2017-08-17 MED ORDER — VANCOMYCIN HCL 10 G IV SOLR
1500.0000 mg | INTRAVENOUS | Status: DC
  Administered 2017-08-17 – 2017-08-20 (×4): 1500 mg via INTRAVENOUS
  Filled 2017-08-17 (×5): qty 1500

## 2017-08-17 NOTE — Plan of Care (Signed)
  Progressing Education: Knowledge of General Education information will improve 08/17/2017 0811 - Progressing by Darreld Mcleanox, Harshil Cavallaro, RN Health Behavior/Discharge Planning: Ability to manage health-related needs will improve 08/17/2017 0811 - Progressing by Darreld Mcleanox, Charvez Voorhies, RN Clinical Measurements: Ability to maintain clinical measurements within normal limits will improve 08/17/2017 0811 - Progressing by Darreld Mcleanox, Aloni Chuang, RN Will remain free from infection 08/17/2017 0811 - Progressing by Darreld Mcleanox, Latanza Pfefferkorn, RN Diagnostic test results will improve 08/17/2017 0811 - Progressing by Darreld Mcleanox, Jaydis Duchene, RN Respiratory complications will improve 08/17/2017 0811 - Progressing by Darreld Mcleanox, Pearl Berlinger, RN Cardiovascular complication will be avoided 08/17/2017 0811 - Progressing by Darreld Mcleanox, Shaye Elling, RN Activity: Risk for activity intolerance will decrease 08/17/2017 0811 - Progressing by Darreld Mcleanox, Dahlton Hinde, RN Nutrition: Adequate nutrition will be maintained 08/17/2017 0811 - Progressing by Darreld Mcleanox, Amyria Komar, RN Coping: Level of anxiety will decrease 08/17/2017 0811 - Progressing by Darreld Mcleanox, Wilson Dusenbery, RN Elimination: Will not experience complications related to bowel motility 08/17/2017 0811 - Progressing by Darreld Mcleanox, Oliviagrace Crisanti, RN Will not experience complications related to urinary retention 08/17/2017 0811 - Progressing by Darreld Mcleanox, Jolon Degante, RN Pain Managment: General experience of comfort will improve 08/17/2017 0811 - Progressing by Darreld Mcleanox, Marielouise Amey, RN Safety: Ability to remain free from injury will improve 08/17/2017 0811 - Progressing by Darreld Mcleanox, Jacqueli Pangallo, RN Skin Integrity: Risk for impaired skin integrity will decrease 08/17/2017 0811 - Progressing by Darreld Mcleanox, Arlind Klingerman, RN

## 2017-08-17 NOTE — Progress Notes (Addendum)
   Daily Progress Note     RIGHT    LEFT    PRESSURE WAVEFORM  PRESSURE WAVEFORM  BRACHIAL Unable to obtain due to IV location Triphasic BRACHIAL 147 Triphasic  DP 54 Monophasic DP 119 Monophasic  AT   AT    PT 37 Monophasic PT 102 Monophasic  PER   PER    GREAT TOE  NA GREAT TOE  NA    RIGHT LEFT  ABI 0.37 0.81   ABI consistent with severe disease in R leg, consistent with physical exam.   Lab Results  Component Value Date   CREATININE 1.58 (H) 08/17/2017   CREATININE 2.52 (H) 08/15/2017   CREATININE 1.38 (H) 08/09/2017   AKI resolving.  Will proceed with Ao, RRo, possible right leg intervention tomorrow afternoon.   Leonides SakeBrian Chen, MD, FACS [facility] [place] Office: [phone] Pager: [phone]  08/17/2017, 8:08 AM

## 2017-08-17 NOTE — Progress Notes (Signed)
Pharmacy Antibiotic Note  Reva Boresroy Piche Jr. is a 77 y.o. male admitted on 08/15/2017 with R foot pain thought to be gangrene. Pharmacy consulted to dose vancomycin/zosyn for cellulitis. Afebrile, WBC 10.6, SCr improved from 2.52 on admit to 1.58 this AM.  Plan: -Empirically increase vancomycin to 1500mg  IV q24h with improving renal function -Zosyn 3.375 g IV q8h (4h infusion) -Monitor clinical progress, c/s, renal function -F/u de-escalation plan/LOT, vancomycin trough as indicated -Vascular planning possible procedure   Height: 5\' 11"  (180.3 cm) Weight: 194 lb 8 oz (88.2 kg) IBW/kg (Calculated) : 75.3  Temp (24hrs), Avg:98.2 F (36.8 C), Min:97.8 F (36.6 C), Max:98.6 F (37 C)  Recent Labs  Lab 08/15/17 1840 08/15/17 1854 08/17/17 0523  WBC 10.6*  --   --   CREATININE 2.52*  --  1.58*  LATICACIDVEN  --  1.39  --     Estimated Creatinine Clearance: 43 mL/min (A) (by C-G formula based on SCr of 1.58 mg/dL (H)).    Allergies  Allergen Reactions  . Pollen Extract     Antimicrobials this admission: 3/12 vancomycin > 3/12 zosyn >  Dose adjustments this admission: 3/13 - empirically increase vanc for improving SCr  Microbiology results: 3/11 blood cx:  Babs BertinHaley Shante Archambeault, PharmD, BCPS Clinical Pharmacist Clinical phone for 08/17/2017 until 3:30pm: [phone] If after 3:30pm, please call main pharmacy at: [phone] 08/17/2017 11:19 AM

## 2017-08-17 NOTE — Progress Notes (Signed)
Lower Extremity Vein Map    Right Great Saphenous Vein   Segment Diameter Comment  1. Origin 6.8782mm   2. High Thigh 4.4802mm Branching  3. Mid Thigh 3.4374mm   4. Low Thigh 4.352mm Branching  5. At Knee 3.5938mm   6. High Calf 2.2237mm Branching  7. Low Calf 3.5665mm Branching  8. Ankle 3.5829mm                 Right Small Saphenous Vein  Segment Diameter Comment  1. Origin 3.2206mm   2. High Calf 3.1706mm   3. Low Calf 2.7465mm Branching  4. Ankle 1.6746mm                 Left Great Saphenous Vein  Segment Diameter Comment  1. Origin 5.3990mm   2. High Thigh 5.5011mm   3. Mid Thigh 3.6706mm Branching  4. Low Thigh 2.5106mm   5. At Knee 2.6307mm   6. High Calf 3.8747mm   7. Low Calf 2.4315mm Branching  8. Ankle 2.9883mm        mm    mm     Left Small Saphenous Vein  Segment Diameter Comment  1. Origin 3.4583mm   2. High Calf 1.6083mm Branching  3. Low Calf 1.6028mm   4. Ankle 1.437mm                 08/17/2017 1:28 PM Gertie FeyMichelle Donnarae Rae, BS, RVT, RDCS, RDMS

## 2017-08-17 NOTE — Progress Notes (Addendum)
Initial Nutrition Assessment  DOCUMENTATION CODES:   Not applicable  INTERVENTION:    Heart Healthy diet  Prostat liquid protein po 30 ml daily, each supplement provides 100 kcal, 15 grams protein  NUTRITION DIAGNOSIS:   Increased nutrient needs related to wound healing as evidenced by estimated needs  GOAL:   Patient will meet greater than or equal to 90% of their needs  MONITOR:   Diet advancement, PO intake, Supplement acceptance, Labs, Skin, Weight trends  REASON FOR ASSESSMENT:   Malnutrition Screening Tool  ASSESSMENT:   77 y.o. Male with history of HTN, carotid endarterectomy and ongoing tobacco abuse has been experiencing increasing pain and swelling in the right foot for the last 3-4 weeks.   RD spoke with pt at bedside. Daughter at beside.  He just got back from Vascular Lab and is hungry.  Plan is for R leg intervention per Vascular Surgery.  He would benefit from addition of protein supplement. Medications include folic acid and Vitamin B-12. Labs reviewed. BUN 35 (H).    NUTRITION - FOCUSED PHYSICAL EXAM:  Unable to complete at this time.  Diet Order:  Diet NPO time specified Except for: Sips with Meds Diet NPO time specified  EDUCATION NEEDS:   Not appropriate for education at this time  Skin:  Skin Assessment: Skin Integrity Issues: Skin Integrity Issues:: Other (Comment) Other: R 4-5th toe dry gangrene  Last BM:  3/12  Height:   Ht Readings from Last 1 Encounters:  08/16/17 5\' 11"  (1.803 m)    Weight:   Wt Readings from Last 1 Encounters:  08/16/17 194 lb 8 oz (88.2 kg)    Ideal Body Weight:  78.1 kg  BMI:  Body mass index is 27.13 kg/m.  Estimated Nutritional Needs:   Kcal:  1900-2100  Protein:  90-105 gm  Fluid:  1.9-2.1 L  Maureen ChattersKatie Derrich Gaby, RD, LDN Pager #: [phone] After-Hours Pager #: [phone]

## 2017-08-17 NOTE — Progress Notes (Signed)
Hospitalist progress note   Brandon Boresroy Schuchart Jr.  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  Followed by neurology Left heart cath 09/2010-  Admit 08/16/2017 right foot pain increased pain on walking MRI did not show any involvement of bone Vascular surgery consulted-was supposed to have arteriogram performed 3/14 Found to be in acute renal failure baseline creatinine 1.3-1.6 on admission 60/2.5, Mild leukocytosis of 10  Assessment & Plan:   Assessment:  The encounter diagnosis was Peripheral vascular disease (HCC).  Gangrene of right foot-defer to vascular surgeon, ABI shows severe arterial insuff.  Currently is npo for possible mapping etc Pain better controlled 6/10 continue vancomycin and Zosyn at this time  Acute kidney injury-holding Diovan HCT not on any other diuretics.  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, folic acid deficiency-continue same in addition to gabapentin 100 3 times daily  Diabetes mellitus type 2-does not appear to be on meds at this time--he doesn't have this diagnosis according to him  Iron deficiency anemia continue 325 mg 3 times daily  DVT prophylaxis: lovenox  Code Status:   full   Family Communication:    sistersa t bedside   Disposition Plan: IP-await further planning as per Vasc   Consultants:   vasc  Procedures:   none  Antimicrobials:    vacn/zosyn 3/12   Subjective:   Fair, npo for procedure Hungry Pain controlled no fever no n/no diarr    Objective: Vitals:   08/16/17 0533 08/16/17 1500 08/16/17 2130 08/17/17 0446  BP: (!) 149/48 (!) 147/50 131/60 (!) 149/63  Pulse: 61 64 60 60  Resp:  18 17 19   Temp:  97.8 F (36.6 C) 98.3 F (36.8 C) 98.6 F (37  C)  TempSrc:  Oral Oral Oral  SpO2:  99% 99% 97%  Weight:      Height:        Intake/Output Summary (Last 24 hours) at 08/17/2017 0809 Last data filed at 08/17/2017 0602 Gross per 24 hour  Intake 720 ml  Output 1000 ml  Net -280 ml   Filed Weights   08/16/17 0400  Weight: 88.2 kg (194 lb 8 oz)    Examination:  eomi Cannot palpate DP pulses or Post tibial s1 s2 no m/r/g   cta b   Data Reviewed: I have personally reviewed following labs and imaging studies  CBC: Recent Labs  Lab 08/15/17 1840  WBC 10.6*  NEUTROABS 8.0*  HGB 14.5  HCT 44.2  MCV 96.9  PLT 289   Basic Metabolic Panel: Recent Labs  Lab 08/15/17 1840 08/17/17 0523  NA 137 136  K 4.1 4.0  CL 101 105  CO2 25 22  GLUCOSE 106* 96  BUN 60* 35*  CREATININE 2.52* 1.58*  CALCIUM 9.4 8.6*  PHOS  --  2.9   GFR: Estimated Creatinine Clearance: 43 mL/min (A) (by C-G formula based on SCr of 1.58 mg/dL (H)). Liver Function Tests: Recent Labs  Lab 08/17/17 0523  ALBUMIN 2.5*   No results for input(s): LIPASE, AMYLASE in the last 168 hours. No results for input(s): AMMONIA in the last 168 hours. Coagulation Profile: No results for input(s): INR, PROTIME in the last 168 hours. Cardiac Enzymes: No results for input(s): CKTOTAL, CKMB, CKMBINDEX, TROPONINI in the last  168 hours. CBG: No results for input(s): GLUCAP in the last 168 hours. Urine analysis:    Component Value Date/Time   COLORURINE YELLOW 08/16/2017 0458   APPEARANCEUR CLEAR 08/16/2017 0458   LABSPEC 1.019 08/16/2017 0458   PHURINE 5.0 08/16/2017 0458   GLUCOSEU NEGATIVE 08/16/2017 0458   HGBUR NEGATIVE 08/16/2017 0458   BILIRUBINUR NEGATIVE 08/16/2017 0458   KETONESUR NEGATIVE 08/16/2017 0458   PROTEINUR NEGATIVE 08/16/2017 0458   UROBILINOGEN 0.2 09/17/2010 1354   NITRITE NEGATIVE 08/16/2017 0458   LEUKOCYTESUR NEGATIVE 08/16/2017 0458     Radiology Studies: Reviewed images personally in health database    Scheduled  Meds: . amLODipine  5 mg Oral Daily  . DULoxetine  30 mg Oral Daily  . ferrous sulfate  325 mg Oral TID WC  . folic acid  5 mg Oral Daily  . gabapentin  100 mg Oral TID  . metoprolol succinate  50 mg Oral Daily  . vitamin B-12  1,000 mcg Oral Daily   Continuous Infusions: . piperacillin-tazobactam (ZOSYN)  IV Stopped (08/17/17 0602)  . vancomycin Stopped (08/17/17 0022)     LOS: 2 days    Time spent: 47    Pleas Koch, MD [facility]   If 7PM-7AM, please contact night-coverage [email or web address] Password [facility] 08/17/2017, 8:09 AM

## 2017-08-18 ENCOUNTER — Other Ambulatory Visit: Payer: Self-pay

## 2017-08-18 ENCOUNTER — Encounter (HOSPITAL_COMMUNITY): Payer: Self-pay | Admitting: General Practice

## 2017-08-18 ENCOUNTER — Ambulatory Visit (HOSPITAL_COMMUNITY): Admit: 2017-08-18 | Payer: Medicare Other | Admitting: Vascular Surgery

## 2017-08-18 ENCOUNTER — Encounter (HOSPITAL_COMMUNITY)
Admission: EM | Disposition: A | Discharge status: Home Health | Payer: Self-pay | Source: Home / Self Care | Attending: Family Medicine

## 2017-08-18 DIAGNOSIS — I10 Essential (primary) hypertension: Secondary | ICD-10-CM

## 2017-08-18 DIAGNOSIS — N179 Acute kidney failure, unspecified: Secondary | ICD-10-CM

## 2017-08-18 DIAGNOSIS — I998 Other disorder of circulatory system: Secondary | ICD-10-CM

## 2017-08-18 DIAGNOSIS — I739 Peripheral vascular disease, unspecified: Secondary | ICD-10-CM

## 2017-08-18 DIAGNOSIS — Z01818 Encounter for other preprocedural examination: Secondary | ICD-10-CM

## 2017-08-18 DIAGNOSIS — I5032 Chronic diastolic (congestive) heart failure: Secondary | ICD-10-CM

## 2017-08-18 HISTORY — PX: ABDOMINAL AORTOGRAM W/LOWER EXTREMITY: CATH118223

## 2017-08-18 LAB — BASIC METABOLIC PANEL
Anion gap: 7 (ref 5–15)
BUN: 29 mg/dL — ABNORMAL HIGH (ref 6–20)
CALCIUM: 8.7 mg/dL — AB (ref 8.9–10.3)
CO2: 27 mmol/L (ref 22–32)
Chloride: 104 mmol/L (ref 101–111)
Creatinine, Ser: 1.49 mg/dL — ABNORMAL HIGH (ref 0.61–1.24)
GFR, EST AFRICAN AMERICAN: 51 mL/min — AB (ref >60)
GFR, EST NON AFRICAN AMERICAN: 44 mL/min — AB (ref >60)
GLUCOSE: 96 mg/dL (ref 65–99)
POTASSIUM: 4.7 mmol/L (ref 3.5–5.1)
Sodium: 138 mmol/L (ref 135–145)

## 2017-08-18 LAB — CBC
HEMATOCRIT: 37.8 % — AB (ref 39.0–52.0)
Hemoglobin: 12.1 g/dL — ABNORMAL LOW (ref 13.0–17.0)
MCH: 31.5 pg (ref 26.0–34.0)
MCHC: 32 g/dL (ref 30.0–36.0)
MCV: 98.4 fL (ref 78.0–100.0)
Platelets: 224 10*3/uL (ref 150–400)
RBC: 3.84 MIL/uL — AB (ref 4.22–5.81)
RDW: 14.1 % (ref 11.5–15.5)
WBC: 9 10*3/uL (ref 4.0–10.5)

## 2017-08-18 SURGERY — ABDOMINAL AORTOGRAM W/LOWER EXTREMITY
Anesthesia: LOCAL

## 2017-08-18 MED ORDER — HEPARIN (PORCINE) IN NACL 2-0.9 UNIT/ML-% IJ SOLN
INTRAMUSCULAR | Status: AC | PRN
  Administered 2017-08-18: 500 mL

## 2017-08-18 MED ORDER — FENTANYL CITRATE (PF) 100 MCG/2ML IJ SOLN
INTRAMUSCULAR | Status: AC
  Filled 2017-08-18: qty 2

## 2017-08-18 MED ORDER — MIDAZOLAM HCL 2 MG/2ML IJ SOLN
INTRAMUSCULAR | Status: AC
  Filled 2017-08-18: qty 2

## 2017-08-18 MED ORDER — SODIUM CHLORIDE 0.9% FLUSH: 3.0000 mL | INTRAVENOUS | Status: DC | PRN

## 2017-08-18 MED ORDER — LIDOCAINE HCL 1 % IJ SOLN
INTRAMUSCULAR | Status: AC
  Filled 2017-08-18: qty 20

## 2017-08-18 MED ORDER — SODIUM CHLORIDE 0.9 % IV SOLN: 250.0000 mL | INTRAVENOUS | Status: DC | PRN

## 2017-08-18 MED ORDER — FENTANYL CITRATE (PF) 100 MCG/2ML IJ SOLN
INTRAMUSCULAR | Status: DC | PRN
  Administered 2017-08-18: 50 ug via INTRAVENOUS

## 2017-08-18 MED ORDER — HYDRALAZINE HCL 20 MG/ML IJ SOLN
INTRAMUSCULAR | Status: AC
  Filled 2017-08-18: qty 1

## 2017-08-18 MED ORDER — HYDRALAZINE HCL 20 MG/ML IJ SOLN
5.0000 mg | INTRAMUSCULAR | Status: DC | PRN
  Filled 2017-08-18: qty 1

## 2017-08-18 MED ORDER — MORPHINE SULFATE (PF) 4 MG/ML IV SOLN
INTRAVENOUS | Status: AC
  Filled 2017-08-18: qty 1

## 2017-08-18 MED ORDER — HEPARIN (PORCINE) IN NACL 2-0.9 UNIT/ML-% IJ SOLN
INTRAMUSCULAR | Status: AC
  Filled 2017-08-18: qty 1000

## 2017-08-18 MED ORDER — OXYCODONE HCL 5 MG PO TABS
5.0000 mg | ORAL_TABLET | ORAL | Status: DC | PRN
  Administered 2017-08-19 – 2017-08-26 (×24): 10 mg via ORAL
  Filled 2017-08-18 (×3): qty 2
  Filled 2017-08-18: qty 1
  Filled 2017-08-18 (×6): qty 2
  Filled 2017-08-18: qty 1
  Filled 2017-08-18 (×15): qty 2

## 2017-08-18 MED ORDER — SODIUM CHLORIDE 0.9 % IV SOLN
INTRAVENOUS | Status: DC
  Administered 2017-08-18: 05:00:00 via INTRAVENOUS

## 2017-08-18 MED ORDER — ASPIRIN EC 81 MG PO TBEC
81.0000 mg | DELAYED_RELEASE_TABLET | Freq: Every day | ORAL | Status: DC
  Administered 2017-08-18 – 2017-08-26 (×9): 81 mg via ORAL
  Filled 2017-08-18 (×9): qty 1

## 2017-08-18 MED ORDER — SODIUM CHLORIDE 0.9% FLUSH
3.0000 mL | Freq: Two times a day (BID) | INTRAVENOUS | Status: DC
  Administered 2017-08-19 – 2017-08-26 (×10): 3 mL via INTRAVENOUS

## 2017-08-18 MED ORDER — SODIUM CHLORIDE 0.9 % WEIGHT BASED INFUSION: 1.0000 mL/kg/h | INTRAVENOUS | Status: AC

## 2017-08-18 MED ORDER — LABETALOL HCL 5 MG/ML IV SOLN
10.0000 mg | INTRAVENOUS | Status: DC | PRN
  Administered 2017-08-18: 10 mg via INTRAVENOUS

## 2017-08-18 MED ORDER — ATORVASTATIN CALCIUM 10 MG PO TABS
10.0000 mg | ORAL_TABLET | Freq: Every day | ORAL | Status: DC
  Administered 2017-08-18: 10 mg via ORAL
  Filled 2017-08-18: qty 1

## 2017-08-18 MED ORDER — HYDRALAZINE HCL 20 MG/ML IJ SOLN
INTRAMUSCULAR | Status: DC | PRN
  Administered 2017-08-18: 10 mg via INTRAVENOUS

## 2017-08-18 MED ORDER — LABETALOL HCL 5 MG/ML IV SOLN
INTRAVENOUS | Status: AC
  Filled 2017-08-18: qty 4

## 2017-08-18 MED ORDER — LIDOCAINE HCL (PF) 1 % IJ SOLN
INTRAMUSCULAR | Status: DC | PRN
  Administered 2017-08-18: 18 mL

## 2017-08-18 MED ORDER — MIDAZOLAM HCL 2 MG/2ML IJ SOLN
INTRAMUSCULAR | Status: DC | PRN
  Administered 2017-08-18: 1 mg via INTRAVENOUS

## 2017-08-18 SURGICAL SUPPLY — 12 items
CATH OMNI FLUSH 5F 65CM (CATHETERS) ×2 IMPLANT
CATH STRAIGHT 5FR 65CM (CATHETERS) ×2 IMPLANT
COVER PRB 48X5XTLSCP FOLD TPE (BAG) ×1 IMPLANT
COVER PROBE 5X48 (BAG) ×1
DEVICE TORQUE .025-.038 (MISCELLANEOUS) ×2 IMPLANT
GUIDEWIRE ANGLED .035X150CM (WIRE) ×2 IMPLANT
KIT PV (KITS) ×2 IMPLANT
SHEATH AVANTI 11CM 5FR (SHEATH) ×2 IMPLANT
SYR MEDRAD MARK V 150ML (SYRINGE) ×2 IMPLANT
TRANSDUCER W/STOPCOCK (MISCELLANEOUS) ×2 IMPLANT
TRAY PV CATH (CUSTOM PROCEDURE TRAY) ×2 IMPLANT
WIRE BENTSON .035X145CM (WIRE) ×2 IMPLANT

## 2017-08-18 NOTE — Progress Notes (Signed)
Hospitalist progress note   [name].  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  Followed by neurology Left heart cath 09/2010-  Admit 08/16/2017 right foot pain increased pain on walking MRI did not show any involvement of bone Vascular surgery consulted-was supposed to have arteriogram performed 3/14 Found to be in acute renal failure baseline creatinine 1.3-1.6 on admission 60/2.5, Mild leukocytosis of 10  Assessment & Plan:   Assessment:  The encounter diagnosis was Peripheral vascular disease (HCC).  Gangrene of right foot-defer to vascular surgeon, ABI shows severe arterial insuff.  Currently is npo for angiogram LE's to see if toes can be saved Pain better controlled 6/10 continue vancomycin and Zosyn at this time  Acute kidney injury-holding Diovan HCT not on any other diuretics.  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, folic acid deficiency-continue same in addition to gabapentin 100 3 times daily  Diabetes mellitus type 2-does not appear to be on meds at this time--he doesn't have this diagnosis according to him  Iron deficiency anemia continue 325 mg 3 times daily  DVT prophylaxis: lovenox  Code Status:   full   Family Communication:    sistersa t bedside   Disposition Plan: IP-await further planning as per Vasc   Consultants:   vasc  Procedures:   none  Antimicrobials:    vacn/zosyn 3/12   Subjective:   Awake alert NPO for procedure Pain controlled no distress no n/v cp   Objective: Vitals:   08/17/17 0446 08/17/17 2018 08/18/17 0257 08/18/17 0445  BP: (!) 149/63 122/67  (!) 151/59  Pulse: 60 (!) 55  (!) 48  Resp: 19 19  18   Temp: 98.6 F (37 C) 98.4 F (36.9  C)  98 F (36.7 C)  TempSrc: Oral Oral  Oral  SpO2: 97% 99%  98%  Weight:   91.7 kg (202 lb 2.6 oz)   Height:        Intake/Output Summary (Last 24 hours) at 08/18/2017 1106 Last data filed at 08/18/2017 0900 Gross per 24 hour  Intake 0 ml  Output 950 ml  Net -950 ml   Filed Weights   08/16/17 0400 08/18/17 0257  Weight: 88.2 kg (194 lb 8 oz) 91.7 kg (202 lb 2.6 oz)    Examination:  eomi Cannot palpate DP pulses or Post tibial cta b s1 s 2no m/r/g Neuro intact without deficit  Data Reviewed: I have personally reviewed following labs and imaging studies  CBC: Recent Labs  Lab 08/15/17 1840 08/18/17 0440  WBC 10.6* 9.0  NEUTROABS 8.0*  --   HGB 14.5 12.1*  HCT 44.2 37.8*  MCV 96.9 98.4  PLT 289 224   Basic Metabolic Panel: Recent Labs  Lab 08/15/17 1840 08/17/17 0523 08/18/17 0440  NA 137 136 138  K 4.1 4.0 4.7  CL 101 105 104  CO2 25 22 27   GLUCOSE 106* 96 96  BUN 60* 35* 29*  CREATININE 2.52* 1.58* 1.49*  CALCIUM 9.4 8.6* 8.7*  PHOS  --  2.9  --    GFR: Estimated Creatinine Clearance: 49.6 mL/min (A) (by C-G formula based on SCr of 1.49 mg/dL (H)). Liver Function Tests: Recent Labs  Lab 08/17/17 0523  ALBUMIN 2.5*   No results for input(s):  LIPASE, AMYLASE in the last 168 hours. No results for input(s): AMMONIA in the last 168 hours. Coagulation Profile: No results for input(s): INR, PROTIME in the last 168 hours. Cardiac Enzymes: No results for input(s): CKTOTAL, CKMB, CKMBINDEX, TROPONINI in the last 168 hours. CBG: No results for input(s): GLUCAP in the last 168 hours. Urine analysis:    Component Value Date/Time   COLORURINE YELLOW 08/16/2017 0458   APPEARANCEUR CLEAR 08/16/2017 0458   LABSPEC 1.019 08/16/2017 0458   PHURINE 5.0 08/16/2017 0458   GLUCOSEU NEGATIVE 08/16/2017 0458   HGBUR NEGATIVE 08/16/2017 0458   BILIRUBINUR NEGATIVE 08/16/2017 0458   KETONESUR NEGATIVE 08/16/2017 0458   PROTEINUR NEGATIVE 08/16/2017 0458    UROBILINOGEN 0.2 09/17/2010 1354   NITRITE NEGATIVE 08/16/2017 0458   LEUKOCYTESUR NEGATIVE 08/16/2017 0458     Radiology Studies: Reviewed images personally in health database    Scheduled Meds: . amLODipine  5 mg Oral Daily  . DULoxetine  30 mg Oral Daily  . feeding supplement (PRO-STAT SUGAR FREE 64)  30 mL Oral Daily  . ferrous sulfate  325 mg Oral TID WC  . folic acid  5 mg Oral Daily  . gabapentin  100 mg Oral TID  . metoprolol succinate  50 mg Oral Daily  . vitamin B-12  1,000 mcg Oral Daily   Continuous Infusions: . sodium chloride 100 mL/hr at 08/18/17 0505  . piperacillin-tazobactam (ZOSYN)  IV Stopped (08/18/17 0550)  . vancomycin Stopped (08/17/17 2259)     LOS: 3 days    Time spent: 415    Pleas KochJai Jillianne Gamino, MD [facility](P) [phone]   If 7PM-7AM, please contact night-coverage [email or web address] Password [email or web address] 08/18/2017, 11:06 AM

## 2017-08-18 NOTE — Interval H&P Note (Signed)
   History and Physical Update  The patient was interviewed and re-examined.  The patient's previous History and Physical has been reviewed and is unchanged from Dr. Evelina DunField's consult except for: interval improvement in renal function.  There is no change in the plan of care: Aortogram, Right leg runoff, and possible intervention.   I discussed with the patient the nature of angiographic procedures, especially the limited patencies of any endovascular intervention.    The patient is aware of that the risks of an angiographic procedure include but are not limited to: bleeding, infection, access site complications, renal failure, embolization, rupture of vessel, dissection, arteriovenous fistula, possible need for emergent surgical intervention, possible need for surgical procedures to treat the patient's pathology, anaphylactic reaction to contrast, and stroke and death.    The patient is aware of the risks and agrees to proceed.   Brandon SakeBrian Macario Shear, MD, FACS [facility] [place] Office: [phone] Pager: [phone]  08/18/2017, 9:35 AM

## 2017-08-18 NOTE — Progress Notes (Signed)
Site area: LFA Site Prior to Removal:  Level 0 Pressure Applied For:25 min Manual:yes    Patient Status During Pull:  stable Post Pull Site:  Level 0 Post Pull Instructions Given:  yes Post Pull Pulses Present: doppler Dressing Applied:  tegaderm Bedrest begins @ 1355 till 1755 Comments:

## 2017-08-18 NOTE — Consult Note (Signed)
Cardiology Consultation:   Patient ID: [name].; [id number]; 27-Aug-1940   Admit date: 08/15/2017 Date of Consult: 08/18/2017  Primary Care Provider: Monica Becton, MD Primary Cardiologist: No primary care provider on file.    Patient Profile:   [name]. is a 77 y.o. male with a hx of hypertension, hyperlipidemia, carotid artery disease S/P right CEA 2012 and CKD who is being seen today for preoperative cardiac evaluation at the request of Dr. Imogene Burn.  History of Present Illness:   Mr. Conant has had a 4-6-week history of pain in the right foot. He has developed duskiness of the 4th and fifth toes and was sent to the ED by his podiatrist today with concerns for infection. He was empirically started on IV antibiotics.  Vascular surgery was consulted and Dr. Imogene Burn performed an abdominal aortogram with lower extremity today that showed left renal artery occlusion, right above the knee popliteal occlusion with remainder of right leg arterial system patent. Dr. Imogene Burn requested cardiac risk stratification and optimization for possible right above the knee to below the knee popliteal artery bypass and right 4th-5th toe amputation to be done on or after 08/23/17.   Mr. Scouten continues to smoke half pack per day. He lives alone and is fairly sedentary. He does his own ADLs and some light housework without any exertional symptoms. He is limited by his foot pain recently. He is unable to climb a flight of stairs or walk for a block. He denies palpitations, dyspnea, chest discomfort, orthopnea, edema.   Mr. Westry was last seen in our office on 09/02/2010 by Dr. Clifton James for cardiac evaluation prior to carotid endarterectomy surgery.  The patient was noted to have no prior cardiac issues but a strong family history of CAD including his father and brother.  The patient reported exertional chest discomfort and dyspnea so a cardiac catheterization was done on 09/10/2010 that revealed mild nonobstructive CAD  and normal LV systolic function.  Past Medical History:  Diagnosis Date  . Carotid artery occlusion   . Colon polyps   . Degenerative arthritis of spine   . Diverticulosis   . Hemorrhoids   . Hemorrhoids   . Hyperlipidemia   . Hypertension    2010  . Ischemia of foot 08/2017  . Sciatica   . Solar keratosis   . Vitamin B12 deficiency     Past Surgical History:  Procedure Laterality Date  . CAROTID ENDARTERECTOMY  09/21/10   Right CEA  . NECK MASS EXCISION       Home Medications:  Prior to Admission medications   Medication Sig Start Date End Date Taking? Authorizing Provider  amLODipine (NORVASC) 5 MG tablet Take 1 tablet (5 mg total) by mouth daily. 11/02/16  Yes Monica Becton, MD  ciprofloxacin (CIPRO) 750 MG tablet Take 1 tablet (750 mg total) by mouth 2 (two) times daily for 10 days. 08/09/17 08/19/17 Yes Monica Becton, MD  DULoxetine (CYMBALTA) 30 MG capsule Take 1 capsule (30 mg total) by mouth daily. 08/09/17  Yes Monica Becton, MD  ferrous sulfate 325 (65 FE) MG EC tablet Take 1 tablet (325 mg total) by mouth 3 (three) times daily with meals. 02/17/15  Yes Monica Becton, MD  HYDROcodone-acetaminophen (NORCO/VICODIN) 5-325 MG tablet Take 1 tablet by mouth every 8 (eight) hours as needed for moderate pain. 08/09/17  Yes Monica Becton, MD  metoprolol succinate (TOPROL-XL) 50 MG 24 hr tablet TAKE 1 TABLET(25 MG) BY MOUTH DAILY 08/09/17  Yes Monica Becton, MD  promethazine (PHENERGAN) 25 MG tablet Take 1 tablet (25 mg total) by mouth every 6 (six) hours as needed for nausea. 08/10/17  Yes Monica Becton, MD  valsartan-hydrochlorothiazide (DIOVAN-HCT) 320-25 MG tablet Take 0.5 tablets by mouth daily. 08/12/17  Yes Monica Becton, MD  AMBULATORY NON FORMULARY MEDICATION Knee-high, medium compression, graduated compression stockings. Apply to lower extremities. [email or web address], Zippered Compression Stockings, medium  circ, long length 01/27/15   Monica Becton, MD  azelastine (OPTIVAR) 0.05 % ophthalmic solution Place 2 drops into both eyes 2 (two) times daily. Patient not taking: Reported on 08/15/2017 08/23/16   Monica Becton, MD  folic acid (FOLVITE) 1 MG tablet Take 5 tablets (5 mg total) by mouth daily. Patient not taking: Reported on 08/15/2017 02/21/15   Monica Becton, MD  gabapentin (NEURONTIN) 100 MG capsule Take 1 capsule (100 mg total) by mouth 3 (three) times daily for 10 days. Patient not taking: Reported on 08/15/2017 08/04/17 08/14/17  Wieters, Hallie C, PA-C  ondansetron (ZOFRAN-ODT) 8 MG disintegrating tablet Take 1 tablet (8 mg total) by mouth every 8 (eight) hours as needed for nausea. Patient not taking: Reported on 08/15/2017 08/10/17   Monica Becton, MD  vitamin B-12 (CYANOCOBALAMIN) 1000 MCG tablet Take 1 tablet (1,000 mcg total) by mouth daily. Patient not taking: Reported on 08/15/2017 06/16/15   Monica Becton, MD    Inpatient Medications: Scheduled Meds: . amLODipine  5 mg Oral Daily  . aspirin EC  81 mg Oral Daily  . atorvastatin  10 mg Oral q1800  . DULoxetine  30 mg Oral Daily  . feeding supplement (PRO-STAT SUGAR FREE 64)  30 mL Oral Daily  . ferrous sulfate  325 mg Oral TID WC  . folic acid  5 mg Oral Daily  . gabapentin  100 mg Oral TID  . metoprolol succinate  50 mg Oral Daily  . sodium chloride flush  3 mL Intravenous Q12H  . vitamin B-12  1,000 mcg Oral Daily   Continuous Infusions: . sodium chloride 100 mL/hr at 08/18/17 0505  . sodium chloride    . sodium chloride 1 mL/kg/hr (08/18/17 1317)  . piperacillin-tazobactam (ZOSYN)  IV Stopped (08/18/17 0550)  . vancomycin Stopped (08/17/17 2259)   PRN Meds: sodium chloride, acetaminophen **OR** acetaminophen, hydrALAZINE, hydrALAZINE, labetalol, morphine injection, ondansetron **OR** ondansetron (ZOFRAN) IV, oxyCODONE, promethazine, sodium chloride flush  Allergies:    Allergies    Allergen Reactions  . Pollen Extract     Social History:   Social History   Socioeconomic History  . Marital status: Widowed    Spouse name: Not on file  . Number of children: 2  . Years of education: HS  . Highest education level: Not on file  Social Needs  . Financial resource strain: Not on file  . Food insecurity - worry: Not on file  . Food insecurity - inability: Not on file  . Transportation needs - medical: Not on file  . Transportation needs - non-medical: Not on file  Occupational History  . Occupation: retired  Tobacco Use  . Smoking status: Current Every Day Smoker    Packs/day: 1.00    Years: 50.00    Pack years: 50.00    Types: Cigarettes  . Smokeless tobacco: Never Used  Substance and Sexual Activity  . Alcohol use: Yes    Alcohol/week: 0.0 oz    Comment: Patient drinks 3 beers daily  . Drug use: No  .  Sexual activity: Not on file  Other Topics Concern  . Not on file  Social History Narrative   Patient drinks about 2 cups of coffee daily.   Patient is right handed.    Family History:    Family History  Problem Relation Age of Onset  . Dementia Mother   . Heart attack Father        First MI age 76.  Marland Kitchen. Heart disease Father [id number]  . Heart attack Brother        CAD, prior MI  . Hypertension Brother   . ALS Sister      ROS:  Please see the history of present illness. All other ROS reviewed and negative.     Physical Exam/Data:   Vitals:   08/18/17 1550 08/18/17 1555 08/18/17 1600 08/18/17 1634  BP:   (!) 141/59 (!) 152/66  Pulse: (!) 57 (!) 54 (!) 57 (!) 57  Resp: (!) 21 19 19 19   Temp:    98.3 F (36.8 C)  TempSrc:    Oral  SpO2: 99% 99% 99% 100%  Weight:    194 lb 7.1 oz (88.2 kg)  Height:    5\' 11"  (1.803 m)    Intake/Output Summary (Last 24 hours) at 08/18/2017 1725 Last data filed at 08/18/2017 0900 Gross per 24 hour  Intake 0 ml  Output 950 ml  Net -950 ml   Filed Weights   08/16/17 0400 08/18/17 0257 08/18/17 1634   Weight: 194 lb 8 oz (88.2 kg) 202 lb 2.6 oz (91.7 kg) 194 lb 7.1 oz (88.2 kg)   Body mass index is 27.12 kg/m.  General:  Well nourished, well developed, in no acute distress HEENT: normal Lymph: no adenopathy Neck: no JVD Endocrine:  No thryomegaly Vascular: No carotid bruits; FA pulses 2+ bilaterally without bruits  Cardiac:  normal S1, S2; RRR; no murmur  Lungs:  clear to auscultation bilaterally, no wheezing, rhonchi or rales  Abd: soft, nontender, no hepatomegaly  Ext: no edema Musculoskeletal:  No deformities, BUE and BLE strength normal and equal Skin: warm and dry  Neuro:  CNs 2-12 intact, no focal abnormalities noted Psych:  Normal affect   EKG:  The EKG was personally reviewed and demonstrates:  NSR 61 bpm with RBBB, previously present.  Telemetry:  Telemetry was personally reviewed and demonstrates:  SB in the 50's  Relevant CV Studies:  ABDOMINAL AORTOGRAM W/LOWER EXTREMITY 08/18/17  Conclusion    Left renal artery occlusion  Right above-the-knee popliteal occlusion: position not favorable for endovascular intervention  Rest of right leg arterial system patent     Cardiac catheterization 09/10/10 ANGIOGRAPHIC FINDINGS: 1. The left main coronary artery had no evidence of disease. 2. Left anterior descending was a large vessel that coursed to the apex and gave off several diagonal branches.  There appeared to be mild 20% stenosis in the proximal left anterior descending artery. 3. Circumflex artery had no obstructive disease. 4. The right coronary artery had mild proximal 20% stenosis. 5. Left ventricular angiogram was performed in the RAO projection and showed normal left ventricular systolic function with an ejection fraction of 60%.  IMPRESSION: 1. Mild nonobstructive coronary artery disease. 2. Normal left ventricular systolic function.  RECOMMENDATIONS:  This patient can proceed to his planned carotid artery surgery per Dr. Imogene Burnhen.  No further cardiac  workup is necessary.  In regard to his hypertension, we will restart his ACE inhibitor at a lower dose.  Laboratory Data:  Chemistry Recent Labs  Lab 08/15/17 1840 08/17/17 0523 08/18/17 0440  NA 137 136 138  K 4.1 4.0 4.7  CL 101 105 104  CO2 25 22 27   GLUCOSE 106* 96 96  BUN 60* 35* 29*  CREATININE 2.52* 1.58* 1.49*  CALCIUM 9.4 8.6* 8.7*  GFRNONAA 23* 41* 44*  GFRAA 27* 48* 51*  ANIONGAP 11 9 7     Recent Labs  Lab 08/17/17 0523  ALBUMIN 2.5*   Hematology Recent Labs  Lab 08/15/17 1840 08/18/17 0440  WBC 10.6* 9.0  RBC 4.56 3.84*  HGB 14.5 12.1*  HCT 44.2 37.8*  MCV 96.9 98.4  MCH 31.8 31.5  MCHC 32.8 32.0  RDW 14.2 14.1  PLT 289 224   Cardiac EnzymesNo results for input(s): TROPONINI in the last 168 hours. No results for input(s): TROPIPOC in the last 168 hours.  BNPNo results for input(s): BNP, PROBNP in the last 168 hours.  DDimer No results for input(s): DDIMER in the last 168 hours.  Radiology/Studies:  Dg Foot Complete Right  Result Date: 08/15/2017 CLINICAL DATA:  Necrotic fourth toe for several weeks and fifth toe over the past 2-3 days EXAM: RIGHT FOOT COMPLETE - 3+ VIEW COMPARISON:  MRI 08/09/2017 FINDINGS: Soft tissue wasting of the fourth and fifth digits of the right foot. No cortical bone destruction however is noted however of the fourth and fifth toes. No acute fracture nor suspicious osseous lesions. Bony sequestra. Joint spaces are maintained apart from some mild degenerative joint space narrowing of the DIP and PIP joints of the second fifth digits. No soft tissue emphysema or mass. IMPRESSION: Mild soft tissue wasting of the fourth and fifth digits. No frank bone destruction or fracture. Electronically Signed   By: Tollie Eth M.D.   On: 08/15/2017 18:40    Assessment and Plan:   Gangrene of the rigtht foot: 4-6-week history of pain in the right foot with duskiness of the 4th and fifth toes and was sent to the ED by his podiatrist today with  concerns for infection. He was empirically started on IV antibiotics. Abdominal aortogram with lower extremity today showed left renal artery occlusion, right above the knee popliteal occlusion with remainder of right leg arterial system patent. Dr. Imogene Burn plans to do a right above the knee to below the knee popliteal artery bypass and right 4th-5th toe amputation to be done on or after 08/23/17.   Acute kidney injury: CKD not noted in history, but baseline SCr around 1.3-1.6. On admission, Scr 2.52. Improved to 1.49 today.   Hypertension: Home meds include norvasc 5 mg daily. Toprol XL 25 mg daily, valsartan-HCTZ 160-12.5 mg daily. ARB/HCTZ currenlty on hold likely due to renal function. Hydralazine prn.   Chronic tobacco use: cessation is strongly advised in setting or vascular disease.   Pre-operative cardiovascular risk assessment: -According to the RCRI perioperative risk assessment tool this patient has Class 1 risk with 0.4% risk of major cardiac event perioperatively, however, the patient's activity level is likely less than 4 Mets. He is at increased risk for surgical complications related to his activity intolerance.  -We will order a nuclear stress test and echocardiogram to further risk stratify the patient. If these tests are normal he can proceed with surgery.    For questions or updates, please contact [facility] Please consult [email or web address] for contact info under Cardiology/STEMI.   Signed, Berton Bon, NP  08/18/2017 5:25 PM

## 2017-08-18 NOTE — Progress Notes (Addendum)
   Daily Progress Note   Angiogram demonstrates chronic right above-the-knee popliteal artery occlusion not favorable for endovascular intervention due to proximity to knee joint.   Would get cardiac risk stratification and optimization for possible right above-the-knee to below-the-knee popliteal artery bypass, R 4th-5th toe amputation.  If cardiac risk high, might consider R femoral antegrade cannulation, atherectomy and angioplasty right popliteal artery.  At earliest, procedure would be done on 19 MAR 19   Leonides SakeBrian Chen, MD, FACS [facility] [place] Office: [phone] Pager: [phone]  08/18/2017, 1:18 PM

## 2017-08-18 NOTE — Op Note (Addendum)
OPERATIVE NOTE   PROCEDURE: 1.  Left common femoral artery cannulation under ultrasound guidance 2.  Placement of catheter in aorta 3.  Aortogram 4.  Second order arterial selection 5.  Right leg runfof 6.  Conscious sedation for 27 minutes  PRE-OPERATIVE DIAGNOSIS: right foot gangrene  POST-OPERATIVE DIAGNOSIS: same as above   SURGEON: Brandon SakeBrian Chen, MD  ANESTHESIA: conscious sedation  ESTIMATED BLOOD LOSS: 50 cc  CONTRAST: 55 cc  FINDING(S):  Aorta: patent   Right Left  RA Patent, nephrogram visualized occluded  CIA patent patent  EIA patent patent  IIA patent patent  CFA patent patent  SFA patent   PFA patent   Pop above-the-knee popliteal artery occlusion with collateral reconstitution of below-the-knee segment   Trif patent   AT Patent: dominant runoff   Pero patent   PT patent    SPECIMEN(S):  none  INDICATIONS:   Brandon Boresroy Rutkowski Jr. is a 77 y.o. 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  The patient presents for: aortogram, right leg runoff, and possible intervention.  I discussed with the patient the nature of angiographic procedures, especially the limited patencies of any endovascular intervention.  The patient is aware of that the risks of an angiographic procedure include but are not limited to: bleeding, infection, access site complications, renal failure, embolization, rupture of vessel, dissection, possible need for emergent surgical intervention, possible need for surgical procedures to treat the patient's pathology, and stroke and death.  The patient is aware of the risks and agrees to proceed.  DESCRIPTION: After full informed consent was obtained from the patient, the patient was brought back to the angiography suite.  The patient was placed supine upon the angiography table and connected to cardiopulmonary monitoring equipment.  The patient was then given conscious sedation, the amounts of which are documented in the patient's chart.  A  circulating radiologic technician maintained continuous monitoring of the patient's cardiopulmonary status.  Additionally, the control room radiologic technician provided backup monitoring throughout the procedure.  The patient was prepped and drape in the standard fashion for an angiographic procedure.  At this point, attention was turned to the left groin.  Under ultrasound guidance,  The subcutaneous tissue surrounding the left common femoral artery was anesthesized with 1% lidocaine with epinephrine.  The artery was then cannulated with a 18 gauge needle.  The Bentson wire was passed up into the aorta.  The needle was exchanged for a 5-Fr sheath, which was advanced over the wire into the common femoral artery.  The dilator was then removed.  The Omniflush catheter was then loaded over the wire up to the level of L1.  The catheter was connected to the power injector circuit.  After de-airring and de-clotting the circuit, a power injector aortogram was completed.    Using a Glidewire wire and Omniflush catheter, the right common iliac artery was selected.  The catheter and wire were advanced into the external iliac artery.  The wire was removed and then the catheter connected to the power injector circuit.  An automated right runoff was completed.  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  Per patient's history, he has been symptomatic for over one month, subsequently, I felt endovascular intervention might result in distal embolization.  The location of the lesion is also unfavorable for popliteal stenting.  Would get cardiac risk stratification, if patient has patient is acceptable risk will proceed with right above-the-knee to below-the-knee popliteal bypass and  right 4th and 5th toe amputations.  If his cardiac risk is high, would attempt athrectomy of right popliteal artery with embolic filter placement.  I replaced the wire into the catheter, straightening out  the crook in the catheter.  Both were removed from the sheath together.  The sheath was aspirated.  No clots were present and the sheath was reloaded with heparinized saline.     COMPLICATIONS: none  CONDITION: stable   Brandon Sake, MD, [facility] [place] Office: [phone] Pager: [phone]  08/18/2017, 1:09 PM

## 2017-08-19 ENCOUNTER — Encounter (HOSPITAL_COMMUNITY): Payer: Self-pay | Admitting: Vascular Surgery

## 2017-08-19 ENCOUNTER — Ambulatory Visit: Payer: Medicare Other | Admitting: Sports Medicine

## 2017-08-19 ENCOUNTER — Inpatient Hospital Stay (HOSPITAL_COMMUNITY): Payer: Medicare Other

## 2017-08-19 ENCOUNTER — Other Ambulatory Visit (HOSPITAL_COMMUNITY): Payer: Medicare Other

## 2017-08-19 DIAGNOSIS — R079 Chest pain, unspecified: Secondary | ICD-10-CM

## 2017-08-19 LAB — BASIC METABOLIC PANEL
ANION GAP: 6 (ref 5–15)
BUN: 23 mg/dL — ABNORMAL HIGH (ref 6–20)
CO2: 22 mmol/L (ref 22–32)
Calcium: 8.4 mg/dL — ABNORMAL LOW (ref 8.9–10.3)
Chloride: 109 mmol/L (ref 101–111)
Creatinine, Ser: 1.47 mg/dL — ABNORMAL HIGH (ref 0.61–1.24)
GFR calc Af Amer: 52 mL/min — ABNORMAL LOW (ref >60)
GFR, EST NON AFRICAN AMERICAN: 45 mL/min — AB (ref >60)
GLUCOSE: 100 mg/dL — AB (ref 65–99)
POTASSIUM: 4.2 mmol/L (ref 3.5–5.1)
Sodium: 137 mmol/L (ref 135–145)

## 2017-08-19 LAB — NM MYOCAR MULTI W/SPECT W/WALL MOTION / EF
CHL CUP RESTING HR STRESS: 49 {beats}/min
CSEPHR: 42 %
CSEPPHR: 62 {beats}/min
Estimated workload: 1 METS
Exercise duration (min): 0 min
Exercise duration (sec): 0 s
MPHR: 145 {beats}/min

## 2017-08-19 MED ORDER — REGADENOSON 0.4 MG/5ML IV SOLN
0.4000 mg | Freq: Once | INTRAVENOUS | Status: AC
  Administered 2017-08-19: 0.4 mg via INTRAVENOUS
  Filled 2017-08-19: qty 5

## 2017-08-19 MED ORDER — METOPROLOL SUCCINATE ER 25 MG PO TB24
25.0000 mg | ORAL_TABLET | Freq: Every day | ORAL | Status: DC
  Administered 2017-08-20 – 2017-08-23 (×4): 25 mg via ORAL
  Filled 2017-08-19 (×5): qty 1

## 2017-08-19 MED ORDER — AMLODIPINE BESYLATE 10 MG PO TABS
10.0000 mg | ORAL_TABLET | Freq: Every day | ORAL | Status: DC
  Administered 2017-08-20 – 2017-08-26 (×7): 10 mg via ORAL
  Filled 2017-08-19 (×7): qty 1

## 2017-08-19 MED ORDER — REGADENOSON 0.4 MG/5ML IV SOLN
INTRAVENOUS | Status: AC
  Filled 2017-08-19: qty 5

## 2017-08-19 MED ORDER — ATORVASTATIN CALCIUM 40 MG PO TABS
40.0000 mg | ORAL_TABLET | Freq: Every day | ORAL | Status: DC
  Administered 2017-08-19 – 2017-08-26 (×7): 40 mg via ORAL
  Filled 2017-08-19 (×8): qty 1

## 2017-08-19 MED ORDER — TECHNETIUM TC 99M TETROFOSMIN IV KIT
10.0000 | PACK | Freq: Once | INTRAVENOUS | Status: AC | PRN
  Administered 2017-08-19: 10 via INTRAVENOUS

## 2017-08-19 MED ORDER — TECHNETIUM TC 99M TETROFOSMIN IV KIT
30.0000 | PACK | Freq: Once | INTRAVENOUS | Status: AC | PRN
  Administered 2017-08-19: 30 via INTRAVENOUS

## 2017-08-19 MED FILL — Lidocaine HCl Local Inj 1%: INTRAMUSCULAR | Qty: 20 | Status: AC

## 2017-08-19 MED FILL — Morphine Sulfate Inj 4 MG/ML: INTRAMUSCULAR | Qty: 0.5 | Status: AC

## 2017-08-19 NOTE — Progress Notes (Signed)
   There was no ST segment deviation noted during stress.  No T wave inversion was noted during stress.  The left ventricular ejection fraction is normal (55-65%).  Nuclear stress EF: 57%.  The study is normal.   1. EF 57%, normal wall motion.  2. No ischemia or infarction by perfusion images.    See myoview report above, patient cleared for surgery given low risk Tamala Julianmyoview  Signed, Acelin Ferdig PA Pager: [phone]

## 2017-08-19 NOTE — Progress Notes (Signed)
Progress Note  Patient Name: [name]. Date of Encounter: 08/19/2017  Primary Cardiologist: Verne Carrow, MD   Subjective   Denies any CP or SOB.   Inpatient Medications    Scheduled Meds: . amLODipine  5 mg Oral Daily  . aspirin EC  81 mg Oral Daily  . atorvastatin  10 mg Oral q1800  . DULoxetine  30 mg Oral Daily  . feeding supplement (PRO-STAT SUGAR FREE 64)  30 mL Oral Daily  . ferrous sulfate  325 mg Oral TID WC  . folic acid  5 mg Oral Daily  . gabapentin  100 mg Oral TID  . metoprolol succinate  50 mg Oral Daily  . regadenoson      . sodium chloride flush  3 mL Intravenous Q12H  . vitamin B-12  1,000 mcg Oral Daily   Continuous Infusions: . sodium chloride 100 mL/hr at 08/18/17 0505  . sodium chloride    . piperacillin-tazobactam (ZOSYN)  IV 3.375 g (08/19/17 1023)  . vancomycin Stopped (08/19/17 0154)   PRN Meds: sodium chloride, acetaminophen **OR** acetaminophen, hydrALAZINE, hydrALAZINE, labetalol, morphine injection, ondansetron **OR** ondansetron (ZOFRAN) IV, oxyCODONE, promethazine, sodium chloride flush, technetium tetrofosmin   Vital Signs    Vitals:   08/19/17 1208 08/19/17 1211 08/19/17 1213 08/19/17 1215  BP: (!) 169/73 (!) 153/73 123/62 133/60  Pulse: (!) 58 (!) 57 60 (!) 57  Resp:      Temp:      TempSrc:      SpO2:      Weight:      Height:        Intake/Output Summary (Last 24 hours) at 08/19/2017 1236 Last data filed at 08/19/2017 1610 Gross per 24 hour  Intake 960 ml  Output 250 ml  Net 710 ml   Filed Weights   08/18/17 0257 08/18/17 1634 08/19/17 0500  Weight: 202 lb 2.6 oz (91.7 kg) 194 lb 7.1 oz (88.2 kg) 207 lb 7.3 oz (94.1 kg)    Telemetry    Sinus bradycardia with HR high 40s to low 50s overnight- Personally Reviewed  ECG    Sinus brady with RBBB - Personally Reviewed  Physical Exam   GEN: No acute distress.   Neck: No JVD Cardiac: RRR, no murmurs, rubs, or gallops.  Respiratory: Clear to  auscultation bilaterally. GI: Soft, nontender, non-distended  MS: R 4th 5th digit black, difficult to feel lower extremity pulse Neuro:  Nonfocal  Psych: Normal affect   Labs    Chemistry Recent Labs  Lab 08/17/17 0523 08/18/17 0440 08/19/17 0430  NA 136 138 137  K 4.0 4.7 4.2  CL 105 104 109  CO2 22 27 22   GLUCOSE 96 96 100*  BUN 35* 29* 23*  CREATININE 1.58* 1.49* 1.47*  CALCIUM 8.6* 8.7* 8.4*  ALBUMIN 2.5*  --   --   GFRNONAA 41* 44* 45*  GFRAA 48* 51* 52*  ANIONGAP 9 7 6      Hematology Recent Labs  Lab 08/15/17 1840 08/18/17 0440  WBC 10.6* 9.0  RBC 4.56 3.84*  HGB 14.5 12.1*  HCT 44.2 37.8*  MCV 96.9 98.4  MCH 31.8 31.5  MCHC 32.8 32.0  RDW 14.2 14.1  PLT 289 224    Cardiac EnzymesNo results for input(s): TROPONINI in the last 168 hours. No results for input(s): TROPIPOC in the last 168 hours.   BNPNo results for input(s): BNP, PROBNP in the last 168 hours.   DDimer No results for input(s): DDIMER in  the last 168 hours.   Radiology    No results found.  Cardiac Studies    Cath 09/10/2010 PROCEDURES PERFORMED: 1. Left heart catheterization. 2. Selective coronary angiography. 3. Left ventricular angiogram HEMODYNAMIC FINDINGS:  Central aortic pressure 144/74.  Left ventricular pressure 146/9/25.  ANGIOGRAPHIC FINDINGS: 1. The left main coronary artery had no evidence of disease. 2. Left anterior descending was a large vessel that coursed to the     apex and gave off several diagonal branches.  There appeared to be     mild 20% stenosis in the proximal left anterior descending artery. 3. Circumflex artery had no obstructive disease. 4. The right coronary artery had mild proximal 20% stenosis. 5. Left ventricular angiogram was performed in the RAO projection and     showed normal left ventricular systolic function with an ejection     fraction of 60%.  IMPRESSION: 1. Mild nonobstructive coronary artery disease. 2. Normal left ventricular  systolic function.   Echo 01/01/2015 LV EF: 55% Study Conclusions  - Left ventricle: The cavity size was normal. Wall thickness was   normal. The estimated ejection fraction was 55%. Wall motion was   normal; there were no regional wall motion abnormalities.   Features are consistent with a pseudonormal left ventricular   filling pattern, with concomitant abnormal relaxation and   increased filling pressure (grade 2 diastolic dysfunction). - Aortic valve: There was no stenosis. There was trivial   regurgitation. - Left atrium: The atrium was moderately dilated. - Right ventricle: The cavity size was normal. Systolic function   was normal. - Right atrium: The atrium was mildly dilated. - Pulmonary arteries: PA peak pressure: 24 mm Hg (S). - Inferior vena cava: The vessel was normal in size. The   respirophasic diameter changes were in the normal range (>= 50%),   consistent with normal central venous pressure.  Impressions:  - Normal LV size with EF 55%. Moderate diastolic dysfunction.   Normal RV size and systolic function. No significant valvular   abnormalities. Biatrial enlargement.  Patient Profile     77 y.o. male with PMH of PAD, carotid disease, HTN, HLD presented with R toe gangrene 2/2 poor vascular perfusion. Require lower extremity bypass and R 4th 5th digit amputation. Cardiology consulted for preop clearance  Assessment & Plan    1. Preop clearance  - pending echo and myoview today. If negative, patient would be cleared to proceed with vascular surgery and amputation  2. R food gangrene: Lower extremity angiography showed occluded L renal artery, occluded popliteal artery above the knee. Planning for femoropopliteal bypass and R 4th and 5th toe amputation  3. Bradycardia: HR high 40s to low 50s while awake. Will reduce toprol XL to 25mg  daily and uptitrate amlodipine to 10mg  daily  4. HTN: reduce metoprolol given bradycardia, increase amlodipine  5. HLD:  last lipid panel 10 days ago showed chol 201, trig 158, LDL 117, HDL 56. Lipitor 10mg  added, but given his significant PAD and cardiovascular risk, consider increase lipitor to 40mg  daily. LDL goal < 70  6. CKD stage III  7. Carotid artery disease s/p R CEA 2012  8. CAD: last cath 09/09/2010 by Dr. Clifton JamesMcAlhany, see cath report above. Mild disease in the coronary vessels  For questions or updates, please contact [facility] Please consult [email or web address] for contact info under Cardiology/STEMI.      Ramond DialSigned, Elka Satterfield, PA  08/19/2017, 12:36 PM

## 2017-08-19 NOTE — Care Management Note (Signed)
Case Management Note Donn PieriniKristi Obdulio Mash RN, BSN Unit 4E-Case Manager [phone]  Patient Details  Name: Brandon Boresroy Hevener Jr. MRN: [id number] Date of Birth: 07/20/1940  Subjective/Objective:   Pt admitted with gangrene of the right foot, PVD, AKI- s/p angiogram with popliteal occlusion- needs popliteal bypass- with 4-5 toe amputation. Cards consulted for cardiac clearance prior to vascular surgery                 Action/Plan: PTA pt lived at home alone- consult received regarding HH needs- spoke with pt at bedside- [facility] pt [facility] [place]- has DME at home that includes cane and RW- pt reports that [facility] but states that no one has come out to his home yet- reports that he does not know what agency was set up to come- CM reviewed chart and found that per Dr. Karie Schwalbe. Office note on 08/09/17- ambulatory referral was [facility] did receive referral from MD office however were unable to accept referral, spoke with Dr. Karie Schwalbe and then [facility]. [facility] 08[facility]12[facility]17 at 1022 am.- [facility]- and she confirmed that she received a phone call from Dr. Karie Schwalbe office on 3/14 regarding [facility] on patient- and accepted referral (pt has not been seen yet as he came into the hospital at this point)- per [facility]- [facility]- will need new orders at time of discharge-- CM will continue to follow for transition of care needs post surgery.   Expected Discharge Date:  (unknown)               Expected Discharge Plan:  Home w Home Health Services  In-House Referral:     Discharge planning Services  CM Consult  Post Acute Care Choice:  Home Health, Resumption of Svcs/PTA Provider Choice offered to:     DME Arranged:    DME Agency:      HH Arranged:  RN HH Agency:  [facility]  Status of Service:  In process, will continue to follow  If discussed at Long Length of Stay Meetings, dates discussed:    Discharge Disposition:   Additional Comments:  Darrold SpanWebster, Saleen Peden Hall, RN 08/19/2017, 2:19 PM

## 2017-08-19 NOTE — Progress Notes (Signed)
Hospitalist progress note   [name].  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  Followed by neurology Left heart cath 09/2010-  Admit 08/16/2017 right foot pain increased pain on walking MRI did not show any involvement of bone Vascular surgery consulted-was supposed to have arteriogram performed 3/14 Found to be in acute renal failure baseline creatinine 1.3-1.6 on admission 60/2.5, Mild leukocytosis of 10  Assessment & Plan:   Assessment:  The encounter diagnosis was Peripheral vascular disease (HCC).  Gangrene of right foot-defer to vascular surgeon, ABI shows severe arterial insuff.   Angiogram shows chronic right above-knee popliteal artery occlusion-might require femorofemoral bypass and amputation of fourth and fifth toes going forward-appreciate cardiology input regarding preop clearance and stress test performed pending Pain better controlled 6/10 continue vancomycin and Zosyn at this time  Acute kidney injury-holding Diovan HCT not on any other diuretics.  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, folic acid deficiency-continue same in addition to gabapentin 100 3 times daily  This bradycardia-cardiology decreasing Toprol to 25 daily and increasing amlodipine to 10  Hyperlipidemia-LDL 117 Lipitor increased to 40 needs fasting lipid panel in 3 months  Diabetes mellitus type 2-does not appear to be on meds at this time--he doesn't have this diagnosis according to him  Iron deficiency anemia continue 325 mg 3 times daily  DVT prophylaxis: lovenox  Code Status:   full   Family Communication:   No family present today Disposition Plan: IP-await further planning as per  Vasc-expect will be here a couple more days for intervention looks strong just back from procedure Eating lunch with GUSTO    Consultants:   vasc  Procedures:   Stress test 3/15 Study Result      There was no ST segment deviation noted during stress.  No T wave inversion was noted during stress.  The left ventricular ejection fraction is normal (55-65%).  Nuclear stress EF: 57%.  The study is normal.   1. EF 57%, normal wall motion.  2. No ischemia or infarction by perfusion images.     Normal study.       Antimicrobials:    vacn/zosyn 3/12   Subjective:  Looks strong eating lunch well No chest pain no nausea no vomiting   Objective: Vitals:   08/19/17 1213 08/19/17 1215 08/19/17 1310 08/19/17 1330  BP: 123/62 133/60 (!) 127/91 (!) 138/55  Pulse: 60 (!) 57 (!) 47 (!) 54  Resp:   20 14  Temp:   98.3 F (36.8 C) 98.2 F (36.8 C)  TempSrc:    Oral  SpO2:   97% 99%  Weight:      Height:        Intake/Output Summary (Last 24 hours) at 08/19/2017 1557 Last data filed at 08/19/2017 1500 Gross per 24 hour  Intake 1080 ml  Output 350 ml  Net 730 ml   Filed Weights   08/18/17 0257 08/18/17 1634 08/19/17 0500  Weight: 91.7 kg (202 lb 2.6 oz) 88.2 kg (194 lb 7.1 oz) 94.1 kg (207 lb 7.3 oz)    Examination:  eomi Feet not examined today cta b no wheeze no rales no rhonchi Heart exam unchanged Neuro intact without deficit  Data Reviewed: I have personally reviewed  following labs and imaging studies  CBC: Recent Labs  Lab 08/15/17 1840 08/18/17 0440  WBC 10.6* 9.0  NEUTROABS 8.0*  --   HGB 14.5 12.1*  HCT 44.2 37.8*  MCV 96.9 98.4  PLT 289 224   Basic Metabolic Panel: Recent Labs  Lab 08/15/17 1840 08/17/17 0523 08/18/17 0440 08/19/17 0430  NA 137 136 138 137  K 4.1 4.0 4.7 4.2  CL 101 105 104 109  CO2 25 22 27 22   GLUCOSE 106* 96 96 100*  BUN 60* 35* 29* 23*  CREATININE 2.52* 1.58* 1.49* 1.47*  CALCIUM 9.4 8.6* 8.7* 8.4*  PHOS   --  2.9  --   --    GFR: Estimated Creatinine Clearance: 50.9 mL/min (A) (by C-G formula based on SCr of 1.47 mg/dL (H)). Liver Function Tests: Recent Labs  Lab 08/17/17 0523  ALBUMIN 2.5*   No results for input(s): LIPASE, AMYLASE in the last 168 hours. No results for input(s): AMMONIA in the last 168 hours. Coagulation Profile: No results for input(s): INR, PROTIME in the last 168 hours. Cardiac Enzymes: No results for input(s): CKTOTAL, CKMB, CKMBINDEX, TROPONINI in the last 168 hours. CBG: No results for input(s): GLUCAP in the last 168 hours. Urine analysis:    Component Value Date/Time   COLORURINE YELLOW 08/16/2017 0458   APPEARANCEUR CLEAR 08/16/2017 0458   LABSPEC 1.019 08/16/2017 0458   PHURINE 5.0 08/16/2017 0458   GLUCOSEU NEGATIVE 08/16/2017 0458   HGBUR NEGATIVE 08/16/2017 0458   BILIRUBINUR NEGATIVE 08/16/2017 0458   KETONESUR NEGATIVE 08/16/2017 0458   PROTEINUR NEGATIVE 08/16/2017 0458   UROBILINOGEN 0.2 09/17/2010 1354   NITRITE NEGATIVE 08/16/2017 0458   LEUKOCYTESUR NEGATIVE 08/16/2017 0458     Radiology Studies: Reviewed images personally in health database    Scheduled Meds: . [START ON 08/20/2017] amLODipine  10 mg Oral Daily  . aspirin EC  81 mg Oral Daily  . atorvastatin  10 mg Oral q1800  . DULoxetine  30 mg Oral Daily  . feeding supplement (PRO-STAT SUGAR FREE 64)  30 mL Oral Daily  . ferrous sulfate  325 mg Oral TID WC  . folic acid  5 mg Oral Daily  . gabapentin  100 mg Oral TID  . [START ON 08/20/2017] metoprolol succinate  25 mg Oral Daily  . sodium chloride flush  3 mL Intravenous Q12H  . vitamin B-12  1,000 mcg Oral Daily   Continuous Infusions: . sodium chloride 100 mL/hr at 08/18/17 0505  . sodium chloride    . piperacillin-tazobactam (ZOSYN)  IV Stopped (08/19/17 1459)  . vancomycin Stopped (08/19/17 0154)     LOS: 4 days    Time spent: 5415    Pleas KochJai Jagdeep Ancheta, MD [facility](P) [phone]   If 7PM-7AM, please  contact night-coverage [email or web address] Password [email or web address] 08/19/2017, 3:57 PM

## 2017-08-19 NOTE — Progress Notes (Signed)
1 day lexiscan myoview, [facility]. No complication during the stress test  SignedAzalee Course, Sharyon Peitz PA Pager: [phone]

## 2017-08-20 ENCOUNTER — Ambulatory Visit (HOSPITAL_COMMUNITY): Payer: Medicare Other

## 2017-08-20 DIAGNOSIS — R001 Bradycardia, unspecified: Secondary | ICD-10-CM

## 2017-08-20 MED ORDER — TRIAMCINOLONE ACETONIDE 0.1 % EX CREA
TOPICAL_CREAM | Freq: Two times a day (BID) | CUTANEOUS | Status: DC
Start: 2017-08-20 — End: 2017-08-26
  Administered 2017-08-20 – 2017-08-22 (×5): via TOPICAL
  Administered 2017-08-22: 1 via TOPICAL
  Administered 2017-08-23 (×2): via TOPICAL
  Administered 2017-08-24: 1 via TOPICAL
  Administered 2017-08-25 – 2017-08-26 (×3): via TOPICAL
  Filled 2017-08-20 (×2): qty 15

## 2017-08-20 MED ORDER — BETAMETHASONE VALERATE 0.1 % EX LOTN
TOPICAL_LOTION | Freq: Two times a day (BID) | CUTANEOUS | Status: DC
  Filled 2017-08-20: qty 60

## 2017-08-20 MED ORDER — HYDROXYZINE HCL 10 MG PO TABS
10.0000 mg | ORAL_TABLET | Freq: Three times a day (TID) | ORAL | Status: DC
  Administered 2017-08-20 (×3): 10 mg via ORAL
  Filled 2017-08-20 (×4): qty 1

## 2017-08-20 NOTE — Progress Notes (Signed)
Pharmacy Antibiotic Note  Brandon Boresroy Gloor Jr. is a 77 y.o. male admitted on 08/15/2017 with R foot pain thought to be gangrene. Pharmacy consulted to dose vancomycin/zosyn for cellulitis.  Today he is afebrile with a normal WBC.  Blood cultures are without growth x 4 days.  Echo completed today.  Noted plans for possible vascular intervention on 3/19.  Plan: -Continue current Vancomycin 1500mg  IV q24h  -Zosyn 3.375 g IV q8h (4h infusion) -Monitor clinical progress, c/s, renal function -F/u de-escalation plan/LOT -Vascular planning possible procedure   Height: 5\' 11"  (180.3 cm) Weight: 204 lb 12.9 oz (92.9 kg) IBW/kg (Calculated) : 75.3  Temp (24hrs), Avg:98.4 F (36.9 C), Min:98.2 F (36.8 C), Max:98.6 F (37 C)  Recent Labs  Lab 08/15/17 1840 08/15/17 1854 08/17/17 0523 08/18/17 0440 08/19/17 0430  WBC 10.6*  --   --  9.0  --   CREATININE 2.52*  --  1.58* 1.49* 1.47*  LATICACIDVEN  --  1.39  --   --   --     Estimated Creatinine Clearance: 50.5 mL/min (A) (by C-G formula based on SCr of 1.47 mg/dL (H)).    Allergies  Allergen Reactions  . Pollen Extract     Antimicrobials this admission: 3/12 vancomycin > 3/12 zosyn >  Dose adjustments this admission: 3/13 - empirically increase vanc for improving SCr  Microbiology results: 3/11 blood cx: (-) x 4 days  Nadara MustardNita Tarus Briski, PharmD., MS Clinical Pharmacist Pager:  [phone] Thank you for allowing pharmacy to be part of this patients care team. 08/20/2017 3:07 PM

## 2017-08-20 NOTE — Progress Notes (Signed)
  Echocardiogram 2D Echocardiogram has been performed.  Roosvelt MaserLane, Clara Herbison F 08/20/2017, 3:22 PM

## 2017-08-20 NOTE — Progress Notes (Signed)
Hospitalist progress note   Brandon Boresroy Eckmann Jr.  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  Followed by neurology Left heart cath 09/2010-  Admit 08/16/2017 right foot pain increased pain on walking MRI did not show any involvement of bone Vascular surgery consulted-was supposed to have arteriogram performed 3/14 Found to be in acute renal failure baseline creatinine 1.3-1.6 on admission 60/2.5, Mild leukocytosis of 10  Assessment & Plan:   Assessment:  The encounter diagnosis was Peripheral vascular disease (HCC).  Gangrene of right foot-defer to vascular surgeon, ABI shows severe arterial insuff.   Angiogram shows chronic right above-knee popliteal artery occlusion-might require femorofemoral bypass and amputation of fourth and fifth toes going forward-appreciate cardiology input regarding preop clearance and stress test performed pending Pain better controlled continue vancomycin and Zosyn at this time Will d/c tele  Acute kidney injury-holding Diovan HCT not on any other diuretics.  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, folic acid deficiency-continue same in addition to gabapentin 100 3 times daily  Psoriasis-started itching on 3/16--Giving atarax 10 tid, starting betamethasone 0.1 to skin  This bradycardia-cardiology decreasing Toprol to 25 daily and increasing amlodipine to 10  Hyperlipidemia-LDL 117 Lipitor increased to 40 needs fasting lipid panel in 3 months  Diabetes mellitus type 2-does not appear to be on meds at this time--he doesn't have this diagnosis according to him--suagrs checks have been stopped  Iron deficiency anemia continue 325 mg  3 times daily  DVT prophylaxis: lovenox  Code Status:   full   Family Communication:   No family present today Disposition Plan: IP-await further planning as per Vasc-expect will be here a couple more days for intervention looks strong just back from procedure Eating lunch with GUSTO    Consultants:   vasc  Procedures:   Stress test 3/15 Study Result      There was no ST segment deviation noted during stress.  No T wave inversion was noted during stress.  The left ventricular ejection fraction is normal (55-65%).  Nuclear stress EF: 57%.  The study is normal.   1. EF 57%, normal wall motion.  2. No ischemia or infarction by perfusion images.     Normal study.       Antimicrobials:    vacn/zosyn 3/12   Subjective:  Well Itchy ++ Plaques have appeard on r and l arms and patient red in face No fever no chills  Objective: Vitals:   08/19/17 1330 08/19/17 2000 08/20/17 0400 08/20/17 0824  BP: (!) 138/55 133/65 104/80 136/62  Pulse: (!) 54     Resp: 14 14 13 20   Temp: 98.2 F (36.8 C) 98.6 F (37 C) 98.2 F (36.8 C) 98.3 F (36.8 C)  TempSrc: Oral Oral Oral Oral  SpO2: 99% 96% 96% 94%  Weight:   92.9 kg (204 lb 12.9 oz)   Height:        Intake/Output Summary (Last 24 hours) at 08/20/2017 0929 Last data filed at 08/20/2017 0907 Gross per 24 hour  Intake 5841.67 ml  Output 301 ml  Net 5540.67 ml   Filed Weights   08/18/17 1634 08/19/17 0500 08/20/17 0400  Weight: 88.2 kg (194 lb 7.1 oz) 94.1 kg (207 lb 7.3 oz)  92.9 kg (204 lb 12.9 oz)    Examination:  eomi cta b no wheeze no rales no rhonchi Heart exam unchanged Neuro intact without deficit Toes looks about the same without change  Data Reviewed: I have personally reviewed following labs and imaging studies  CBC: Recent Labs  Lab 08/15/17 1840 08/18/17 0440  WBC 10.6* 9.0  NEUTROABS 8.0*  --   HGB 14.5 12.1*  HCT 44.2 37.8*  MCV 96.9 98.4  PLT 289 224   Basic Metabolic  Panel: Recent Labs  Lab 08/15/17 1840 08/17/17 0523 08/18/17 0440 08/19/17 0430  NA 137 136 138 137  K 4.1 4.0 4.7 4.2  CL 101 105 104 109  CO2 25 22 27 22   GLUCOSE 106* 96 96 100*  BUN 60* 35* 29* 23*  CREATININE 2.52* 1.58* 1.49* 1.47*  CALCIUM 9.4 8.6* 8.7* 8.4*  PHOS  --  2.9  --   --    GFR: Estimated Creatinine Clearance: 50.5 mL/min (A) (by C-G formula based on SCr of 1.47 mg/dL (H)). Liver Function Tests: Recent Labs  Lab 08/17/17 0523  ALBUMIN 2.5*   No results for input(s): LIPASE, AMYLASE in the last 168 hours. No results for input(s): AMMONIA in the last 168 hours. Coagulation Profile: No results for input(s): INR, PROTIME in the last 168 hours. Cardiac Enzymes: No results for input(s): CKTOTAL, CKMB, CKMBINDEX, TROPONINI in the last 168 hours. CBG: No results for input(s): GLUCAP in the last 168 hours. Urine analysis:    Component Value Date/Time   COLORURINE YELLOW 08/16/2017 0458   APPEARANCEUR CLEAR 08/16/2017 0458   LABSPEC 1.019 08/16/2017 0458   PHURINE 5.0 08/16/2017 0458   GLUCOSEU NEGATIVE 08/16/2017 0458   HGBUR NEGATIVE 08/16/2017 0458   BILIRUBINUR NEGATIVE 08/16/2017 0458   KETONESUR NEGATIVE 08/16/2017 0458   PROTEINUR NEGATIVE 08/16/2017 0458   UROBILINOGEN 0.2 09/17/2010 1354   NITRITE NEGATIVE 08/16/2017 0458   LEUKOCYTESUR NEGATIVE 08/16/2017 0458     Radiology Studies: Reviewed images personally in health database    Scheduled Meds: . amLODipine  10 mg Oral Daily  . aspirin EC  81 mg Oral Daily  . atorvastatin  40 mg Oral q1800  . betamethasone valerate lotion   Topical BID  . DULoxetine  30 mg Oral Daily  . feeding supplement (PRO-STAT SUGAR FREE 64)  30 mL Oral Daily  . ferrous sulfate  325 mg Oral TID WC  . folic acid  5 mg Oral Daily  . gabapentin  100 mg Oral TID  . hydrOXYzine  10 mg Oral TID  . metoprolol succinate  25 mg Oral Daily  . sodium chloride flush  3 mL Intravenous Q12H  . vitamin B-12  1,000 mcg Oral  Daily   Continuous Infusions: . sodium chloride 100 mL/hr at 08/18/17 0505  . sodium chloride    . piperacillin-tazobactam (ZOSYN)  IV 3.375 g (08/20/17 0909)  . vancomycin Stopped (08/20/17 0000)     LOS: 5 days    Time spent: 31    Pleas Koch, MD [facility]   If 7PM-7AM, please contact night-coverage [email or web address] Password [facility] 08/20/2017, 9:29 AM

## 2017-08-20 NOTE — Progress Notes (Signed)
Progress Note   Subjective   Doing well today, the patient denies CP or SOB.  No new concerns  Inpatient Medications    Scheduled Meds: . amLODipine  10 mg Oral Daily  . aspirin EC  81 mg Oral Daily  . atorvastatin  40 mg Oral q1800  . DULoxetine  30 mg Oral Daily  . feeding supplement (PRO-STAT SUGAR FREE 64)  30 mL Oral Daily  . ferrous sulfate  325 mg Oral TID WC  . folic acid  5 mg Oral Daily  . gabapentin  100 mg Oral TID  . hydrOXYzine  10 mg Oral TID  . metoprolol succinate  25 mg Oral Daily  . sodium chloride flush  3 mL Intravenous Q12H  . triamcinolone cream   Topical BID  . vitamin B-12  1,000 mcg Oral Daily   Continuous Infusions: . sodium chloride 100 mL/hr at 08/18/17 0505  . sodium chloride    . piperacillin-tazobactam (ZOSYN)  IV 3.375 g (08/20/17 0909)  . vancomycin Stopped (08/20/17 0000)   PRN Meds: sodium chloride, acetaminophen **OR** acetaminophen, hydrALAZINE, hydrALAZINE, labetalol, morphine injection, ondansetron **OR** ondansetron (ZOFRAN) IV, oxyCODONE, promethazine, sodium chloride flush   Vital Signs    Vitals:   08/19/17 1330 08/19/17 2000 08/20/17 0400 08/20/17 0824  BP: (!) 138/55 133/65 104/80 136/62  Pulse: (!) 54     Resp: 14 14 13 20   Temp: 98.2 F (36.8 C) 98.6 F (37 C) 98.2 F (36.8 C) 98.3 F (36.8 C)  TempSrc: Oral Oral Oral Oral  SpO2: 99% 96% 96% 94%  Weight:   204 lb 12.9 oz (92.9 kg)   Height:        Intake/Output Summary (Last 24 hours) at 08/20/2017 1005 Last data filed at 08/20/2017 2956 Gross per 24 hour  Intake 5841.67 ml  Output 301 ml  Net 5540.67 ml   Filed Weights   08/18/17 1634 08/19/17 0500 08/20/17 0400  Weight: 194 lb 7.1 oz (88.2 kg) 207 lb 7.3 oz (94.1 kg) 204 lb 12.9 oz (92.9 kg)    Telemetry    Sinus rhythm - Personally Reviewed  Physical Exam   GEN- The patient is well appearing, alert and oriented x 3 today.   Head- normocephalic, atraumatic Eyes-  Sclera clear, conjunctiva  pink Ears- hearing intact Oropharynx- clear Neck- supple, Lungs- Clear to ausculation bilaterally, normal work of breathing Heart- Regular rate and rhythm  GI- soft, NT, ND, + BS Extremities- no clubbing, cyanosis, or edema      Labs    Chemistry Recent Labs  Lab 08/17/17 0523 08/18/17 0440 08/19/17 0430  NA 136 138 137  K 4.0 4.7 4.2  CL 105 104 109  CO2 22 27 22   GLUCOSE 96 96 100*  BUN 35* 29* 23*  CREATININE 1.58* 1.49* 1.47*  CALCIUM 8.6* 8.7* 8.4*  ALBUMIN 2.5*  --   --   GFRNONAA 41* 44* 45*  GFRAA 48* 51* 52*  ANIONGAP 9 7 6      Hematology Recent Labs  Lab 08/15/17 1840 08/18/17 0440  WBC 10.6* 9.0  RBC 4.56 3.84*  HGB 14.5 12.1*  HCT 44.2 37.8*  MCV 96.9 98.4  MCH 31.8 31.5  MCHC 32.8 32.0  RDW 14.2 14.1  PLT 289 224    Cardiac EnzymesNo results for input(s): TROPONINI in the last 168 hours. No results for input(s): TROPIPOC in the last 168 hours.      Assessment & Plan    1.  preop Low  risk myoview Proceed with surgery if indicated  2. Sinus bradycardia Improved Asymptomatic Agree with discontinuing telemetry  3. HTN Stable No change required today  4. CAD No ischemic symptoms Low risk myoview  Cardiology team to see as needed while here. Please call with questions.   Hillis RangeJames Kianna Billet MD, [facility] 08/20/2017 10:05 AM

## 2017-08-21 LAB — RENAL FUNCTION PANEL
Albumin: 2.2 g/dL — ABNORMAL LOW (ref 3.5–5.0)
Anion gap: 6 (ref 5–15)
BUN: 22 mg/dL — AB (ref 6–20)
CHLORIDE: 107 mmol/L (ref 101–111)
CO2: 24 mmol/L (ref 22–32)
Calcium: 8.4 mg/dL — ABNORMAL LOW (ref 8.9–10.3)
Creatinine, Ser: 1.53 mg/dL — ABNORMAL HIGH (ref 0.61–1.24)
GFR calc Af Amer: 50 mL/min — ABNORMAL LOW (ref >60)
GFR calc non Af Amer: 43 mL/min — ABNORMAL LOW (ref >60)
Glucose, Bld: 99 mg/dL (ref 65–99)
POTASSIUM: 4.3 mmol/L (ref 3.5–5.1)
Phosphorus: 2.9 mg/dL (ref 2.5–4.6)
Sodium: 137 mmol/L (ref 135–145)

## 2017-08-21 LAB — CULTURE, BLOOD (ROUTINE X 2)
CULTURE: NO GROWTH
CULTURE: NO GROWTH
SPECIAL REQUESTS: ADEQUATE
Special Requests: ADEQUATE

## 2017-08-21 LAB — ECHOCARDIOGRAM COMPLETE
Height: 71 in
WEIGHTICAEL: 3276.92 [oz_av]

## 2017-08-21 LAB — VANCOMYCIN, TROUGH: VANCOMYCIN TR: 23 ug/mL — AB (ref 15–20)

## 2017-08-21 MED ORDER — HYDROXYZINE HCL 25 MG PO TABS
25.0000 mg | ORAL_TABLET | Freq: Three times a day (TID) | ORAL | Status: DC
  Administered 2017-08-21 – 2017-08-26 (×15): 25 mg via ORAL
  Filled 2017-08-21 (×15): qty 1

## 2017-08-21 MED ORDER — SODIUM CHLORIDE 0.9 % IV SOLN
1250.0000 mg | INTRAVENOUS | Status: DC
  Administered 2017-08-21 – 2017-08-24 (×4): 1250 mg via INTRAVENOUS
  Filled 2017-08-21 (×4): qty 1250

## 2017-08-21 NOTE — Progress Notes (Signed)
Hospitalist progress note   [name].  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  Followed by neurology Left heart cath 09/2010-  Admit 08/16/2017 right foot pain increased pain on walking MRI did not show any involvement of bone Vascular surgery consulted-was supposed to have arteriogram performed 3/14 Found to be in acute renal failure baseline creatinine 1.3-1.6 on admission 60/2.5, Mild leukocytosis of 10  Assessment & Plan:   Assessment:  The encounter diagnosis was Peripheral vascular disease (HCC).  Gangrene of right foot-defer to vascular surgeon, ABI shows severe arterial insuff.   Angiogram shows chronic right above-knee popliteal artery occlusion-might require femorofemoral bypass and amputation of fourth and fifth toes  -appreciate cardiology input regarding preop clearance ECHO no WM anomaly continue vancomycin and Zosyn at this time Will d/c tele  Acute kidney injury-holding Diovan HCT not on any other diuretics.  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, folic acid deficiency-continue same in addition to gabapentin 100 3 times daily  Psoriasis-started itching on 3/16--Giving atarax 10 tid-->25 as still itchy, starting betamethasone 0.1 to skin--feel maybe opiates causing itching but patient wishes to continue the same for now   bradycardia-cardiology decreasing Toprol to 25 daily and increasing amlodipine to 10--off tele since 3/16  Hyperlipidemia-LDL 117 Lipitor increased to 40 needs fasting lipid panel in 3 months  Diabetes mellitus type 2-does not appear to be on meds at this time--he doesn't have this diagnosis according to  him--sugars checks have been stopped  Iron deficiency anemia continue 325 mg 3 times daily  DVT prophylaxis: lovenox  Code Status:   full   Family Communication:   No family present today Disposition Plan: IP-await further planning as per Vasc-expect will be here a couple more days for intervention looks strong just back from procedure    Consultants:   vasc  Procedures:   Stress test 3/15 Study Result      There was no ST segment deviation noted during stress.  No T wave inversion was noted during stress.  The left ventricular ejection fraction is normal (55-65%).  Nuclear stress EF: 57%.  The study is normal.   1. EF 57%, normal wall motion.  2. No ischemia or infarction by perfusion images.     Normal study.       Antimicrobials:    vacn/zosyn 3/12   Subjective:  Awake alert in nad tol diet Still very itchy no cp no n/v No fever   Objective: Vitals:   08/20/17 0400 08/20/17 0824 08/20/17 1953 08/21/17 0358  BP: 104/80 136/62 (!) 131/57 (!) 116/57  Pulse:    83  Resp: 13 20 18 14   Temp: 98.2 F (36.8 C) 98.3 F (36.8 C) 98.4 F (36.9 C) 98.1 F (36.7 C)  TempSrc: Oral Oral Oral Oral  SpO2: 96% 94% 98% 94%  Weight: 92.9 kg (204 lb 12.9 oz)   94.3 kg (207 lb 12.8 oz)  Height:        Intake/Output Summary (Last 24 hours) at 08/21/2017 0740 Last data filed at 08/21/2017 0402 Gross per 24 hour  Intake 480 ml  Output 375 ml  Net 105 ml   Filed Weights   08/19/17 0500 08/20/17 0400 08/21/17 0358  Weight:  94.1 kg (207 lb 7.3 oz) 92.9 kg (204 lb 12.9 oz) 94.3 kg (207 lb 12.8 oz)    Examination:  Pleasant sleepy but rousable in nad s1 s2 no m abd soft cta b Wound not exmined   Data Reviewed: I have personally reviewed following labs and imaging studies  CBC: Recent Labs  Lab 08/15/17 1840 08/18/17 0440  WBC 10.6* 9.0  NEUTROABS 8.0*  --   HGB 14.5 12.1*  HCT 44.2 37.8*  MCV 96.9 98.4  PLT 289 224   Basic Metabolic  Panel: Recent Labs  Lab 08/15/17 1840 08/17/17 0523 08/18/17 0440 08/19/17 0430 08/21/17 0231  NA 137 136 138 137 137  K 4.1 4.0 4.7 4.2 4.3  CL 101 105 104 109 107  CO2 25 22 27 22 24   GLUCOSE 106* 96 96 100* 99  BUN 60* 35* 29* 23* 22*  CREATININE 2.52* 1.58* 1.49* 1.47* 1.53*  CALCIUM 9.4 8.6* 8.7* 8.4* 8.4*  PHOS  --  2.9  --   --  2.9   GFR: Estimated Creatinine Clearance: 48.9 mL/min (A) (by C-G formula based on SCr of 1.53 mg/dL (H)). Liver Function Tests: Recent Labs  Lab 08/17/17 0523 08/21/17 0231  ALBUMIN 2.5* 2.2*   No results for input(s): LIPASE, AMYLASE in the last 168 hours. No results for input(s): AMMONIA in the last 168 hours. Coagulation Profile: No results for input(s): INR, PROTIME in the last 168 hours. Cardiac Enzymes: No results for input(s): CKTOTAL, CKMB, CKMBINDEX, TROPONINI in the last 168 hours. CBG: No results for input(s): GLUCAP in the last 168 hours. Urine analysis:    Component Value Date/Time   COLORURINE YELLOW 08/16/2017 0458   APPEARANCEUR CLEAR 08/16/2017 0458   LABSPEC 1.019 08/16/2017 0458   PHURINE 5.0 08/16/2017 0458   GLUCOSEU NEGATIVE 08/16/2017 0458   HGBUR NEGATIVE 08/16/2017 0458   BILIRUBINUR NEGATIVE 08/16/2017 0458   KETONESUR NEGATIVE 08/16/2017 0458   PROTEINUR NEGATIVE 08/16/2017 0458   UROBILINOGEN 0.2 09/17/2010 1354   NITRITE NEGATIVE 08/16/2017 0458   LEUKOCYTESUR NEGATIVE 08/16/2017 0458     Radiology Studies: Reviewed images personally in health database    Scheduled Meds: . amLODipine  10 mg Oral Daily  . aspirin EC  81 mg Oral Daily  . atorvastatin  40 mg Oral q1800  . DULoxetine  30 mg Oral Daily  . feeding supplement (PRO-STAT SUGAR FREE 64)  30 mL Oral Daily  . ferrous sulfate  325 mg Oral TID WC  . folic acid  5 mg Oral Daily  . gabapentin  100 mg Oral TID  . hydrOXYzine  10 mg Oral TID  . metoprolol succinate  25 mg Oral Daily  . sodium chloride flush  3 mL Intravenous Q12H  .  triamcinolone cream   Topical BID  . vitamin B-12  1,000 mcg Oral Daily   Continuous Infusions: . sodium chloride 100 mL/hr at 08/18/17 0505  . sodium chloride    . piperacillin-tazobactam (ZOSYN)  IV Stopped (08/21/17 16100611)  . vancomycin Stopped (08/20/17 2349)     LOS: 6 days    Time spent: 6315    Pleas KochJai Donyae Kilner, MD [facility](P) [phone]   If 7PM-7AM, please contact night-coverage [email or web address] Password [email or web address] 08/21/2017, 7:40 AM

## 2017-08-21 NOTE — Progress Notes (Signed)
Pharmacy Antibiotic Note  Brandon Boresroy Bayona Jr. is a 77 y.o. male admitted on 08/15/2017 with R foot pain thought to be gangrene. Pharmacy consulted to dose vancomycin/zosyn for cellulitis.  Today he is afebrile with a normal WBC.  Blood cultures are without growth x 4 days.  Echo completed today.  Noted plans for possible vascular intervention on 3/19.  Vancomycin trough a little above desired goal of 15-6220mcg/ml at 8523mcg/ml.  This indicates he is not clearing as well as expected.  Will reduce dose a little and recheck a steady state level.  Plan: - Reduce Vancomycin to 1250mg  IV q24h  -Zosyn 3.375 g IV q8h (4h infusion) -Monitor clinical progress, c/s, renal function -F/u de-escalation plan/LOT -Vascular planning possible procedure  Height: 5\' 11"  (180.3 cm) Weight: 207 lb 12.8 oz (94.3 kg) IBW/kg (Calculated) : 75.3  Temp (24hrs), Avg:98.3 F (36.8 C), Min:98.1 F (36.7 C), Max:98.4 F (36.9 C)  Recent Labs  Lab 08/15/17 1840 08/15/17 1854 08/17/17 0523 08/18/17 0440 08/19/17 0430 08/21/17 0231 08/21/17 2000  WBC 10.6*  --   --  9.0  --   --   --   CREATININE 2.52*  --  1.58* 1.49* 1.47* 1.53*  --   LATICACIDVEN  --  1.39  --   --   --   --   --   VANCOTROUGH  --   --   --   --   --   --  23*    Estimated Creatinine Clearance: 48.9 mL/min (A) (by C-G formula based on SCr of 1.53 mg/dL (H)).    Allergies  Allergen Reactions  . Pollen Extract    Antimicrobials this admission: 3/12 vancomycin > 3/12 zosyn >  Dose adjustments this admission: 3/13 - empirically increase vanc for improving SCr  Microbiology results: 3/11 blood cx: (-) x 4 days  Nadara MustardNita Zarius Furr, PharmD., MS Clinical Pharmacist Pager:  [phone] Thank you for allowing pharmacy to be part of this patients care team. 08/21/2017 9:40 PM

## 2017-08-22 NOTE — Progress Notes (Signed)
Hospitalist progress note   [name].  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  Followed by neurology Left heart cath 09/2010-  Admit 08/16/2017 right foot pain increased pain on walking MRI did not show any involvement of bone Vascular surgery consulted-was supposed to have arteriogram performed 3/14 Found to be in acute renal failure baseline creatinine 1.3-1.6 on admission 60/2.5, Mild leukocytosis of 10  Assessment & Plan:   Assessment:  The encounter diagnosis was Peripheral vascular disease (HCC).  Gangrene of right foot-defer to vascular surgeon, ABI shows severe arterial insuff.   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 and is n.p.o.-he understands the procedure risks and benefits  Acute kidney injury-holding Diovan HCT not on any other diuretics.  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, folic acid deficiency-continue same in addition to gabapentin 100 3 times daily  Psoriasis-started itching on 3/16--Giving atarax 10 tid-->25 as still itchy, starting betamethasone 0.1 to skin--feel maybe opiates causing itching-rash is better on higher dose of Atarax   bradycardia-cardiology decreasing Toprol to 25 daily and increasing amlodipine to 10--off tele since 3/16  Hyperlipidemia-LDL 117 Lipitor increased to 40 needs fasting lipid panel in 3 months  Diabetes mellitus type 2-does not appear to be on meds at this  time--he doesn't have this diagnosis according to him--sugars checks have been stopped  Iron deficiency anemia continue 325 mg 3 times daily  DVT prophylaxis: lovenox  Code Status:   full   Family Communication:   No family present today Disposition Plan: IP-await further planning as per Vasc-expect will be here a couple more days for intervention    Consultants:   vasc  Procedures:   Stress test 3/15 Study Result      There was no ST segment deviation noted during stress.  No T wave inversion was noted during stress.  The left ventricular ejection fraction is normal (55-65%).  Nuclear stress EF: 57%.  The study is normal.   1. EF 57%, normal wall motion.  2. No ischemia or infarction by perfusion images.     Normal study.       Antimicrobials:    vacn/zosyn 3/12   Subjective:  Awake alert pleasant no distress eating drinking well-itching has improved to some degree and rash is better No chest pain No overt fever Ready for procedure  Objective: Vitals:   08/21/17 0905 08/21/17 2006 08/22/17 0031 08/22/17 0414  BP: 120/67 (!) 142/56 116/70 (!) 130/58  Pulse: (!) 59  (!) 46 (!) 53  Resp:  16 14 12   Temp: 98.4 F (36.9 C) 98.3 F (36.8 C) 98.2 F (36.8 C) 98.3 F (36.8 C)  TempSrc: Oral Oral Oral Oral  SpO2: 95% 98% 98% 95%  Weight:    94.4 kg (208 lb 3.2 oz)  Height:        Intake/Output Summary (Last 24 hours) at 08/22/2017 0846 Last data filed at 08/22/2017 0515 Gross per 24 hour  Intake 6175 ml  Output 650 ml  Net 5525 ml   [employer]  08/20/17 0400 08/21/17 0358 08/22/17 0414  Weight: 92.9 kg (204 lb 12.9 oz) 94.3 kg (207 lb 12.8 oz) 94.4 kg (208 lb 3.2 oz)    Examination:  Alert oriented EOMI NCAT No pallor no icterus Rash seems improved Abdomen soft nontender Edema absent and toes on the right foot look about the same without any overt changes   Data Reviewed: I have personally reviewed following labs and imaging  studies  CBC: Recent Labs  Lab 08/15/17 1840 08/18/17 0440  WBC 10.6* 9.0  NEUTROABS 8.0*  --   HGB 14.5 12.1*  HCT 44.2 37.8*  MCV 96.9 98.4  PLT 289 224   Basic Metabolic Panel: Recent Labs  Lab 08/15/17 1840 08/17/17 0523 08/18/17 0440 08/19/17 0430 08/21/17 0231  NA 137 136 138 137 137  K 4.1 4.0 4.7 4.2 4.3  CL 101 105 104 109 107  CO2 25 22 27 22 24   GLUCOSE 106* 96 96 100* 99  BUN 60* 35* 29* 23* 22*  CREATININE 2.52* 1.58* 1.49* 1.47* 1.53*  CALCIUM 9.4 8.6* 8.7* 8.4* 8.4*  PHOS  --  2.9  --   --  2.9   GFR: Estimated Creatinine Clearance: 48.9 mL/min (A) (by C-G formula based on SCr of 1.53 mg/dL (H)). Liver Function Tests: Recent Labs  Lab 08/17/17 0523 08/21/17 0231  ALBUMIN 2.5* 2.2*   No results for input(s): LIPASE, AMYLASE in the last 168 hours. No results for input(s): AMMONIA in the last 168 hours. Coagulation Profile: No results for input(s): INR, PROTIME in the last 168 hours. Cardiac Enzymes: No results for input(s): CKTOTAL, CKMB, CKMBINDEX, TROPONINI in the last 168 hours. CBG: No results for input(s): GLUCAP in the last 168 hours. Urine analysis:    Component Value Date/Time   COLORURINE YELLOW 08/16/2017 0458   APPEARANCEUR CLEAR 08/16/2017 0458   LABSPEC 1.019 08/16/2017 0458   PHURINE 5.0 08/16/2017 0458   GLUCOSEU NEGATIVE 08/16/2017 0458   HGBUR NEGATIVE 08/16/2017 0458   BILIRUBINUR NEGATIVE 08/16/2017 0458   KETONESUR NEGATIVE 08/16/2017 0458   PROTEINUR NEGATIVE 08/16/2017 0458   UROBILINOGEN 0.2 09/17/2010 1354   NITRITE NEGATIVE 08/16/2017 0458   LEUKOCYTESUR NEGATIVE 08/16/2017 0458     Radiology Studies: Reviewed images personally in health database    Scheduled Meds: . amLODipine  10 mg Oral Daily  . aspirin EC  81 mg Oral Daily  . atorvastatin  40 mg Oral q1800  . DULoxetine  30 mg Oral Daily  . feeding supplement (PRO-STAT SUGAR FREE 64)  30 mL Oral Daily  . ferrous sulfate  325 mg Oral TID WC  . folic  acid  5 mg Oral Daily  . gabapentin  100 mg Oral TID  . hydrOXYzine  25 mg Oral TID  . metoprolol succinate  25 mg Oral Daily  . sodium chloride flush  3 mL Intravenous Q12H  . triamcinolone cream   Topical BID  . vitamin B-12  1,000 mcg Oral Daily   Continuous Infusions: . sodium chloride 100 mL/hr at 08/18/17 0505  . sodium chloride    . piperacillin-tazobactam (ZOSYN)  IV Stopped (08/22/17 0630)  . vancomycin Stopped (08/22/17 0001)     LOS: 7 days    Time spent: 6015    Pleas KochJai Kimala Horne, MD [facility](P) [phone]   If 7PM-7AM, please contact night-coverage [email or web address] Password [email or web address] 08/22/2017, 8:46 AM

## 2017-08-22 NOTE — Progress Notes (Addendum)
  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; AT by doppler; soft PT by doppler; foot warm to touch Abdomen:  soft Neurologic: A&O  CBC    Component Value Date/Time   WBC 9.0 08/18/2017 0440   RBC 3.84 (L) 08/18/2017 0440   HGB 12.1 (L) 08/18/2017 0440   HCT 37.8 (L) 08/18/2017 0440   PLT 224 08/18/2017 0440   MCV 98.4 08/18/2017 0440   MCH 31.5 08/18/2017 0440   MCHC 32.0 08/18/2017 0440   RDW 14.1 08/18/2017 0440   LYMPHSABS 0.9 08/15/2017 1840   MONOABS 1.5 (H) 08/15/2017 1840   EOSABS 0.2 08/15/2017 1840   BASOSABS 0.1 08/15/2017 1840    BMET    Component Value Date/Time   NA 137 08/21/2017 0231   K 4.3 08/21/2017 0231   CL 107 08/21/2017 0231   CO2 24 08/21/2017 0231   GLUCOSE 99 08/21/2017 0231   BUN 22 (H) 08/21/2017 0231   CREATININE 1.53 (H) 08/21/2017 0231   CREATININE 1.38 (H) 08/09/2017 0948   CALCIUM 8.4 (L) 08/21/2017 0231   GFRNONAA 43 (L) 08/21/2017 0231   GFRNONAA 50 (L) 08/09/2017 0948   GFRAA 50 (L) 08/21/2017 0231   GFRAA 58 (L) 08/09/2017 0948    INR    Component Value Date/Time   INR 0.91 09/17/2010 1355     Intake/Output Summary (Last 24 hours) at 08/22/2017 0749 Last data filed at 08/22/2017 0515 Gross per 24 hour  Intake 6175 ml  Output 650 ml  Net 5525 ml     Assessment/Plan:  77 y.o. 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 Imogene Burnhen NPO after midnight Patient consents to proceed with surgery   Brandon RutterMatthew Eveland, PA-C [facility] [phone] 08/22/2017 7:49  AM  Addendum  I have independently interviewed and examined the patient, and I agree with the physician assistant's findings.  I completed informed consent with the patient for the right above-the-knee to below-the-knee popliteal artery bypass tomorrow.   The risk, benefits, and alternative for bypass operations were discussed with the patient.    The patient is aware the risks include but are not limited to: bleeding, infection, myocardial infarction, stroke, limb loss, nerve damage, limb edema, need for additional procedures in the future, wound complications, and inability to complete the bypass.   The patient is aware of these risks and agreed to proceed.   Brandon SakeBrian Nixie Laube, MD, FACS [facility] [place] Office: [phone] Pager: [phone]  08/22/2017, 11:07 AM

## 2017-08-23 ENCOUNTER — Encounter (HOSPITAL_COMMUNITY): Payer: Self-pay | Admitting: Certified Registered"

## 2017-08-23 ENCOUNTER — Encounter (HOSPITAL_COMMUNITY)
Admission: EM | Disposition: A | Discharge status: Home Health | Payer: Self-pay | Source: Home / Self Care | Attending: Family Medicine

## 2017-08-23 ENCOUNTER — Inpatient Hospital Stay (HOSPITAL_COMMUNITY): Payer: Medicare Other | Admitting: Certified Registered"

## 2017-08-23 DIAGNOSIS — I739 Peripheral vascular disease, unspecified: Secondary | ICD-10-CM | POA: Diagnosis present

## 2017-08-23 HISTORY — PX: AMPUTATION: SHX166

## 2017-08-23 HISTORY — PX: BYPASS GRAFT POPLITEAL TO POPLITEAL: SHX5763

## 2017-08-23 LAB — SURGICAL PCR SCREEN
MRSA, PCR: NEGATIVE
STAPHYLOCOCCUS AUREUS: NEGATIVE

## 2017-08-23 LAB — CBC
HEMATOCRIT: 35 % — AB (ref 39.0–52.0)
HEMOGLOBIN: 11 g/dL — AB (ref 13.0–17.0)
MCH: 31.1 pg (ref 26.0–34.0)
MCHC: 31.4 g/dL (ref 30.0–36.0)
MCV: 98.9 fL (ref 78.0–100.0)
Platelets: 168 10*3/uL (ref 150–400)
RBC: 3.54 MIL/uL — ABNORMAL LOW (ref 4.22–5.81)
RDW: 14.1 % (ref 11.5–15.5)
WBC: 6.2 10*3/uL (ref 4.0–10.5)

## 2017-08-23 LAB — BASIC METABOLIC PANEL
ANION GAP: 8 (ref 5–15)
BUN: 18 mg/dL (ref 6–20)
CHLORIDE: 102 mmol/L (ref 101–111)
CO2: 25 mmol/L (ref 22–32)
Calcium: 8.5 mg/dL — ABNORMAL LOW (ref 8.9–10.3)
Creatinine, Ser: 1.52 mg/dL — ABNORMAL HIGH (ref 0.61–1.24)
GFR calc non Af Amer: 43 mL/min — ABNORMAL LOW (ref >60)
GFR, EST AFRICAN AMERICAN: 50 mL/min — AB (ref >60)
Glucose, Bld: 93 mg/dL (ref 65–99)
Potassium: 4.2 mmol/L (ref 3.5–5.1)
Sodium: 135 mmol/L (ref 135–145)

## 2017-08-23 LAB — GLUCOSE, CAPILLARY: Glucose-Capillary: 79 mg/dL (ref 65–99)

## 2017-08-23 SURGERY — CREATION, BYPASS, ARTERIAL, POPLITEAL
Anesthesia: General | Site: Leg Lower | Laterality: Right

## 2017-08-23 MED ORDER — PHENYLEPHRINE HCL 10 MG/ML IJ SOLN
INTRAVENOUS | Status: DC | PRN
  Administered 2017-08-23: 20 ug/min via INTRAVENOUS

## 2017-08-23 MED ORDER — LIDOCAINE HCL (CARDIAC) 20 MG/ML IV SOLN
INTRAVENOUS | Status: AC
  Filled 2017-08-23: qty 5

## 2017-08-23 MED ORDER — HEPARIN SODIUM (PORCINE) 1000 UNIT/ML IJ SOLN
INTRAMUSCULAR | Status: DC | PRN
  Administered 2017-08-23: 9500 [IU] via INTRAVENOUS

## 2017-08-23 MED ORDER — PROTAMINE SULFATE 10 MG/ML IV SOLN
INTRAVENOUS | Status: AC
  Filled 2017-08-23: qty 10

## 2017-08-23 MED ORDER — PROPOFOL 10 MG/ML IV BOLUS
INTRAVENOUS | Status: DC | PRN
  Administered 2017-08-23: 50 mg via INTRAVENOUS
  Administered 2017-08-23: 130 mg via INTRAVENOUS

## 2017-08-23 MED ORDER — ROCURONIUM BROMIDE 100 MG/10ML IV SOLN
INTRAVENOUS | Status: DC | PRN
  Administered 2017-08-23: 20 mg via INTRAVENOUS
  Administered 2017-08-23: 10 mg via INTRAVENOUS
  Administered 2017-08-23: 20 mg via INTRAVENOUS
  Administered 2017-08-23: 10 mg via INTRAVENOUS
  Administered 2017-08-23: 50 mg via INTRAVENOUS

## 2017-08-23 MED ORDER — FENTANYL CITRATE (PF) 250 MCG/5ML IJ SOLN
INTRAMUSCULAR | Status: AC
  Filled 2017-08-23: qty 5

## 2017-08-23 MED ORDER — LIDOCAINE 2% (20 MG/ML) 5 ML SYRINGE
INTRAMUSCULAR | Status: DC | PRN
  Administered 2017-08-23: 60 mg via INTRAVENOUS

## 2017-08-23 MED ORDER — SODIUM CHLORIDE 0.9 % IV SOLN: 500.0000 mL | Freq: Once | INTRAVENOUS | Status: DC | PRN

## 2017-08-23 MED ORDER — ROCURONIUM BROMIDE 10 MG/ML (PF) SYRINGE
PREFILLED_SYRINGE | INTRAVENOUS | Status: AC
  Filled 2017-08-23: qty 5

## 2017-08-23 MED ORDER — HEPARIN SODIUM (PORCINE) 5000 UNIT/ML IJ SOLN
5000.0000 [IU] | Freq: Three times a day (TID) | INTRAMUSCULAR | Status: DC
  Administered 2017-08-24 – 2017-08-26 (×8): 5000 [IU] via SUBCUTANEOUS
  Filled 2017-08-23 (×8): qty 1

## 2017-08-23 MED ORDER — MAGNESIUM SULFATE 2 GM/50ML IV SOLN: 2.0000 g | Freq: Every day | INTRAVENOUS | Status: DC | PRN

## 2017-08-23 MED ORDER — PROTAMINE SULFATE 10 MG/ML IV SOLN
INTRAVENOUS | Status: DC | PRN
  Administered 2017-08-23: 50 mg via INTRAVENOUS

## 2017-08-23 MED ORDER — FENTANYL CITRATE (PF) 100 MCG/2ML IJ SOLN
INTRAMUSCULAR | Status: DC | PRN
  Administered 2017-08-23 (×2): 50 ug via INTRAVENOUS
  Administered 2017-08-23: 25 ug via INTRAVENOUS
  Administered 2017-08-23: 50 ug via INTRAVENOUS
  Administered 2017-08-23: 25 ug via INTRAVENOUS

## 2017-08-23 MED ORDER — SODIUM CHLORIDE 0.9 % IV SOLN
INTRAVENOUS | Status: DC | PRN
  Administered 2017-08-23: 11:00:00 500 mL

## 2017-08-23 MED ORDER — FENTANYL CITRATE (PF) 100 MCG/2ML IJ SOLN
INTRAMUSCULAR | Status: AC
  Filled 2017-08-23: qty 2

## 2017-08-23 MED ORDER — ONDANSETRON HCL 4 MG/2ML IJ SOLN
INTRAMUSCULAR | Status: AC
  Filled 2017-08-23: qty 2

## 2017-08-23 MED ORDER — PROPOFOL 10 MG/ML IV BOLUS
INTRAVENOUS | Status: AC
  Filled 2017-08-23: qty 20

## 2017-08-23 MED ORDER — BISACODYL 10 MG RE SUPP: 10.0000 mg | Freq: Every day | RECTAL | Status: DC | PRN

## 2017-08-23 MED ORDER — MORPHINE SULFATE (PF) 2 MG/ML IV SOLN
2.0000 mg | INTRAVENOUS | Status: DC | PRN
  Administered 2017-08-23 – 2017-08-25 (×6): 2 mg via INTRAVENOUS
  Filled 2017-08-23 (×6): qty 1

## 2017-08-23 MED ORDER — FENTANYL CITRATE (PF) 100 MCG/2ML IJ SOLN
25.0000 ug | INTRAMUSCULAR | Status: DC | PRN
  Administered 2017-08-23 (×3): 25 ug via INTRAVENOUS
  Administered 2017-08-23: 50 ug via INTRAVENOUS
  Administered 2017-08-23: 25 ug via INTRAVENOUS

## 2017-08-23 MED ORDER — SUGAMMADEX SODIUM 200 MG/2ML IV SOLN
INTRAVENOUS | Status: AC
  Filled 2017-08-23: qty 2

## 2017-08-23 MED ORDER — POLYETHYLENE GLYCOL 3350 17 G PO PACK: 17.0000 g | PACK | Freq: Every day | ORAL | Status: DC | PRN

## 2017-08-23 MED ORDER — POTASSIUM CHLORIDE CRYS ER 20 MEQ PO TBCR: 20.0000 meq | EXTENDED_RELEASE_TABLET | Freq: Every day | ORAL | Status: DC | PRN

## 2017-08-23 MED ORDER — LACTATED RINGERS IV SOLN
INTRAVENOUS | Status: DC
  Administered 2017-08-23 (×2): via INTRAVENOUS

## 2017-08-23 MED ORDER — DOCUSATE SODIUM 100 MG PO CAPS
100.0000 mg | ORAL_CAPSULE | Freq: Every day | ORAL | Status: DC
  Administered 2017-08-24 – 2017-08-25 (×2): 100 mg via ORAL
  Filled 2017-08-23 (×2): qty 1

## 2017-08-23 MED ORDER — HYDROMORPHONE HCL 1 MG/ML IJ SOLN
INTRAMUSCULAR | Status: AC
  Filled 2017-08-23: qty 1

## 2017-08-23 MED ORDER — PANTOPRAZOLE SODIUM 40 MG PO TBEC
40.0000 mg | DELAYED_RELEASE_TABLET | Freq: Every day | ORAL | Status: DC
  Administered 2017-08-24 – 2017-08-26 (×3): 40 mg via ORAL
  Filled 2017-08-23 (×3): qty 1

## 2017-08-23 MED ORDER — SODIUM CHLORIDE 0.9 % IV SOLN
INTRAVENOUS | Status: DC
  Administered 2017-08-24 (×2): via INTRAVENOUS

## 2017-08-23 MED ORDER — METOPROLOL TARTRATE 5 MG/5ML IV SOLN: 2.0000 mg | INTRAVENOUS | Status: DC | PRN

## 2017-08-23 MED ORDER — EPHEDRINE SULFATE-NACL 50-0.9 MG/10ML-% IV SOSY
PREFILLED_SYRINGE | INTRAVENOUS | Status: DC | PRN
  Administered 2017-08-23 (×4): 5 mg via INTRAVENOUS

## 2017-08-23 MED ORDER — ONDANSETRON HCL 4 MG/2ML IJ SOLN
INTRAMUSCULAR | Status: DC | PRN
  Administered 2017-08-23: 4 mg via INTRAVENOUS

## 2017-08-23 MED ORDER — HYDROMORPHONE HCL 1 MG/ML IJ SOLN
0.2500 mg | INTRAMUSCULAR | Status: DC | PRN
  Administered 2017-08-23 (×2): 0.5 mg via INTRAVENOUS

## 2017-08-23 MED ORDER — PHENOL 1.4 % MT LIQD: 1.0000 | OROMUCOSAL | Status: DC | PRN

## 2017-08-23 MED ORDER — GUAIFENESIN-DM 100-10 MG/5ML PO SYRP: 15.0000 mL | ORAL_SOLUTION | ORAL | Status: DC | PRN

## 2017-08-23 MED ORDER — PAPAVERINE HCL 30 MG/ML IJ SOLN
INTRAMUSCULAR | Status: AC
  Filled 2017-08-23: qty 2

## 2017-08-23 MED ORDER — HEMOSTATIC AGENTS (NO CHARGE) OPTIME
TOPICAL | Status: DC | PRN
  Administered 2017-08-23 (×2): 1 via TOPICAL

## 2017-08-23 MED ORDER — METOPROLOL SUCCINATE ER 25 MG PO TB24
25.0000 mg | ORAL_TABLET | Freq: Every day | ORAL | Status: DC
  Administered 2017-08-24 – 2017-08-25 (×2): 25 mg via ORAL
  Filled 2017-08-23 (×3): qty 1

## 2017-08-23 MED ORDER — SUGAMMADEX SODIUM 200 MG/2ML IV SOLN
INTRAVENOUS | Status: DC | PRN
  Administered 2017-08-23: 200 mg via INTRAVENOUS

## 2017-08-23 MED ORDER — 0.9 % SODIUM CHLORIDE (POUR BTL) OPTIME
TOPICAL | Status: DC | PRN
  Administered 2017-08-23: 2000 mL

## 2017-08-23 MED ORDER — GLYCOPYRROLATE 0.2 MG/ML IV SOSY
PREFILLED_SYRINGE | INTRAVENOUS | Status: DC | PRN
  Administered 2017-08-23: .2 mg via INTRAVENOUS

## 2017-08-23 SURGICAL SUPPLY — 70 items
BANDAGE ACE 4X5 VEL STRL LF (GAUZE/BANDAGES/DRESSINGS) ×4 IMPLANT
BANDAGE ESMARK 6X9 LF (GAUZE/BANDAGES/DRESSINGS) IMPLANT
BNDG ESMARK 6X9 LF (GAUZE/BANDAGES/DRESSINGS)
BNDG GAUZE ELAST 4 BULKY (GAUZE/BANDAGES/DRESSINGS) ×4 IMPLANT
CANISTER SUCT 3000ML PPV (MISCELLANEOUS) ×4 IMPLANT
CANNULA VESSEL 3MM 2 BLNT TIP (CANNULA) ×4 IMPLANT
CLIP VESOCCLUDE MED 24/CT (CLIP) ×4 IMPLANT
CLIP VESOCCLUDE SM WIDE 24/CT (CLIP) ×4 IMPLANT
COVER SURGICAL LIGHT HANDLE (MISCELLANEOUS) IMPLANT
CUFF TOURNIQUET SINGLE 24IN (TOURNIQUET CUFF) IMPLANT
CUFF TOURNIQUET SINGLE 34IN LL (TOURNIQUET CUFF) IMPLANT
CUFF TOURNIQUET SINGLE 44IN (TOURNIQUET CUFF) IMPLANT
DERMABOND ADHESIVE PROPEN (GAUZE/BANDAGES/DRESSINGS) ×2
DERMABOND ADVANCED (GAUZE/BANDAGES/DRESSINGS) ×2
DERMABOND ADVANCED .7 DNX12 (GAUZE/BANDAGES/DRESSINGS) ×2 IMPLANT
DERMABOND ADVANCED .7 DNX6 (GAUZE/BANDAGES/DRESSINGS) ×2 IMPLANT
DRAIN CHANNEL 15F RND FF W/TCR (WOUND CARE) IMPLANT
DRAPE EXTREMITY T 121X128X90 (DRAPE) IMPLANT
DRAPE HALF SHEET 40X57 (DRAPES) IMPLANT
DRAPE X-RAY CASS 24X20 (DRAPES) IMPLANT
DRSG EMULSION OIL 3X3 NADH (GAUZE/BANDAGES/DRESSINGS) ×4 IMPLANT
ELECT REM PT RETURN 9FT ADLT (ELECTROSURGICAL) ×4
ELECTRODE REM PT RTRN 9FT ADLT (ELECTROSURGICAL) ×2 IMPLANT
EVACUATOR SILICONE 100CC (DRAIN) IMPLANT
GAUZE SPONGE 4X4 12PLY STRL (GAUZE/BANDAGES/DRESSINGS) ×4 IMPLANT
GLOVE BIO SURGEON STRL SZ7 (GLOVE) ×8 IMPLANT
GLOVE BIOGEL PI IND STRL 7.5 (GLOVE) ×4 IMPLANT
GLOVE BIOGEL PI INDICATOR 7.5 (GLOVE) ×4
GLOVE SURG SS PI 7.0 STRL IVOR (GLOVE) ×4 IMPLANT
GOWN STRL REUS W/ TWL LRG LVL3 (GOWN DISPOSABLE) ×8 IMPLANT
GOWN STRL REUS W/TWL LRG LVL3 (GOWN DISPOSABLE) ×8
GRAFT PROPATEN THIN WALL 6X40 (Vascular Products) ×4 IMPLANT
HEMOSTAT SPONGE AVITENE ULTRA (HEMOSTASIS) ×8 IMPLANT
INSERT FOGARTY SM (MISCELLANEOUS) ×8 IMPLANT
KIT BASIN OR (CUSTOM PROCEDURE TRAY) ×4 IMPLANT
KIT ROOM TURNOVER OR (KITS) ×4 IMPLANT
LOOP VESSEL MINI RED (MISCELLANEOUS) ×4 IMPLANT
MARKER GRAFT CORONARY BYPASS (MISCELLANEOUS) IMPLANT
NS IRRIG 1000ML POUR BTL (IV SOLUTION) ×8 IMPLANT
PACK GENERAL/GYN (CUSTOM PROCEDURE TRAY) IMPLANT
PACK PERIPHERAL VASCULAR (CUSTOM PROCEDURE TRAY) ×4 IMPLANT
PAD ARMBOARD 7.5X6 YLW CONV (MISCELLANEOUS) ×8 IMPLANT
SET COLLECT BLD 21X3/4 12 (NEEDLE) IMPLANT
SPONGE LAP 18X18 X RAY DECT (DISPOSABLE) ×4 IMPLANT
STAPLER VISISTAT 35W (STAPLE) IMPLANT
STOPCOCK 4 WAY LG BORE MALE ST (IV SETS) IMPLANT
SUT ETHILON 2 0 FS 18 (SUTURE) ×12 IMPLANT
SUT ETHILON 3 0 PS 1 (SUTURE) ×8 IMPLANT
SUT GORETEX 5 0 TT13 24 (SUTURE) IMPLANT
SUT GORETEX 6.0 TT13 (SUTURE) ×8 IMPLANT
SUT GORETEX 6.0 TT9 (SUTURE) ×8 IMPLANT
SUT MNCRL AB 4-0 PS2 18 (SUTURE) ×12 IMPLANT
SUT PROLENE 5 0 C 1 24 (SUTURE) ×4 IMPLANT
SUT PROLENE 6 0 BV (SUTURE) ×8 IMPLANT
SUT PROLENE 7 0 BV 1 (SUTURE) ×4 IMPLANT
SUT SILK 2 0 PERMA HAND 18 BK (SUTURE) ×4 IMPLANT
SUT SILK 3 0 (SUTURE) ×2
SUT SILK 3-0 18XBRD TIE 12 (SUTURE) ×2 IMPLANT
SUT SILK 4 0 (SUTURE) ×2
SUT SILK 4-0 18XBRD TIE 12 (SUTURE) ×2 IMPLANT
SUT VIC AB 2-0 CT1 27 (SUTURE) ×6
SUT VIC AB 2-0 CT1 TAPERPNT 27 (SUTURE) ×6 IMPLANT
SUT VIC AB 3-0 SH 27 (SUTURE) ×8
SUT VIC AB 3-0 SH 27X BRD (SUTURE) ×8 IMPLANT
TAPE UMBILICAL COTTON 1/8X30 (MISCELLANEOUS) ×4 IMPLANT
TOWEL GREEN STERILE (TOWEL DISPOSABLE) ×8 IMPLANT
TRAY FOLEY MTR SLVR 16FR STAT (CATHETERS) ×4 IMPLANT
TUBING EXTENTION W/L.L. (IV SETS) IMPLANT
UNDERPAD 30X30 (UNDERPADS AND DIAPERS) ×4 IMPLANT
WATER STERILE IRR 1000ML POUR (IV SOLUTION) ×4 IMPLANT

## 2017-08-23 NOTE — Anesthesia Preprocedure Evaluation (Addendum)
Anesthesia Evaluation  Patient identified by MRN, date of birth, ID band Patient awake    Reviewed: Allergy & Precautions, NPO status , Patient's Chart, lab work & pertinent test results  Airway Mallampati: II  TM Distance: >3 FB Neck ROM: Full    Dental no notable dental hx.    Pulmonary Current Smoker,    Pulmonary exam normal breath sounds clear to auscultation       Cardiovascular hypertension, Pt. on medications and Pt. on home beta blockers + Peripheral Vascular Disease  Normal cardiovascular exam Rhythm:Regular Rate:Normal  Ekg 3/14 Normal sinus rhythm Right bundle branch block  Echo 3/14 Study Conclusions  - Left ventricle: The cavity size was normal. Wall thickness was   increased increased in a pattern of mild to moderate LVH.   Systolic function was normal. The estimated ejection fraction was   in the range of 55% to 60%. Wall motion was normal; there were no   regional wall motion abnormalities. - Left atrium: The atrium was moderately to severely dilated. - Right ventricle: The cavity size was mildly dilated. Wall   thickness was normal.   Neuro/Psych  Neuromuscular disease negative psych ROS   GI/Hepatic negative GI ROS, Neg liver ROS,   Endo/Other  negative endocrine ROS  Renal/GU Renal disease  negative genitourinary   Musculoskeletal negative musculoskeletal ROS (+) Arthritis ,   Abdominal   Peds negative pediatric ROS (+)  Hematology negative hematology ROS (+) anemia ,   Anesthesia Other Findings   Reproductive/Obstetrics negative OB ROS                             Anesthesia Physical Anesthesia Plan  ASA: III  Anesthesia Plan: General   Post-op Pain Management:    Induction: Intravenous  PONV Risk Score and Plan: 2 and Ondansetron, Dexamethasone and Treatment may vary due to age or medical condition  Airway Management Planned: Oral ETT and  LMA  Additional Equipment:   Intra-op Plan:   Post-operative Plan: Extubation in OR  Informed Consent: I have reviewed the patients History and Physical, chart, labs and discussed the procedure including the risks, benefits and alternatives for the proposed anesthesia with the patient or authorized representative who has indicated his/her understanding and acceptance.     Plan Discussed with: CRNA, Anesthesiologist and Surgeon  Anesthesia Plan Comments: (  )        Anesthesia Quick Evaluation

## 2017-08-23 NOTE — Progress Notes (Signed)
  Day of Surgery Note    Subjective:  C/o pain   Vitals:   08/23/17 1542 08/23/17 1548  BP: (!) 127/59   Pulse:    Resp:  (P) 20  Temp:    SpO2:      General: no distress Incisions:   All are clean and dry Extremities:  Brisk right DP/PT/peroneal doppler signals Cardiac:  regular Lungs:  Non labored    Assessment/Plan:  This is a 77 y.o. male who is s/p  1. Right above-the-knee popliteal artery to below-the-knee popliteal artery bypass with Propaten 2. 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 Debridement of right medial heel ulcer 4. Debridement of right 3rd toe ulcer    -pt doing well in recovery with brisk right DP/PT/peroneal doppler signals -incisions are clean and dry -to 4 east when bed available   Doreatha MassedSamantha Ha Shannahan, PA-C 08/23/2017 3:51 PM [phone]

## 2017-08-23 NOTE — Transfer of Care (Signed)
Immediate Anesthesia Transfer of Care Note  Patient: Brandon Boresroy Osowski Jr.  Procedure(s) Performed: BYPASS GRAFT ABOVE THE KNEE POPLITEAL TO BELOW THE KNEE POPLITEAL RIGHT USING PROPATEN GORE VASCULAR GRAFT 6MM X 40MM (Right Leg Lower) AMPUTATION FOURTH AND FIFTH TOES RIGHT FOOT, Debridement of Right Heal and third toe. (Right Foot)  Patient Location: PACU  Anesthesia Type:General  Level of Consciousness: awake, alert  and oriented  Airway & Oxygen Therapy: Patient Spontanous Breathing and Patient connected to face mask oxygen  Post-op Assessment: Report given to RN and Post -op Vital signs reviewed and stable  Post vital signs: Reviewed and stable  Last Vitals:  Vitals:   08/23/17 0823 08/23/17 1529  BP: 138/63   Pulse:    Resp:    Temp: 36.7 C 37.1 C  SpO2:      Last Pain:  Vitals:   08/23/17 0921  TempSrc:   PainSc: 0-No pain      Patients Stated Pain Goal: 2 (08/22/17 2134)  Complications: No apparent anesthesia complications

## 2017-08-23 NOTE — Progress Notes (Signed)
Alyssa GroveS. Rhyne PA at bedside & checked Doppler ant. tibial, peroneal and PT pulses.

## 2017-08-23 NOTE — Interval H&P Note (Signed)
History and Physical Interval Note:  08/23/2017 9:44 AM  Brandon Boresroy David Jr.  has presented today for surgery, with the diagnosis of PERIPHERAL ARTERY DISEASE / NONVIABLE TISSUE FOURTH AND FIFTH TOES RIGHT FOOT  The various methods of treatment have been discussed with the patient and family. After consideration of risks, benefits and other options for treatment, the patient has consented to  Procedure(s): BYPASS GRAFT ABOVE THE KNEE POPLITEAL TO BELOW THE KNEE POPLITEAL RIGHT (Right) AMPUTATION FOURTH AND FIFTH TOES RIGHT FOOT (Right) as a surgical intervention .  The patient's history has been reviewed, patient examined, no change in status, stable for surgery.  I have reviewed the patient's chart and labs.  Questions were answered to the patient's satisfaction.     [name]

## 2017-08-23 NOTE — Care Management Important Message (Signed)
Important Message  Patient Details  Name: Brandon Boresroy Penalver Jr. MRN: [id number] Date of Birth: 01/04/1941   Medicare Important Message Given:  Yes    Lewayne Pauley 08/23/2017, 3:46 PM

## 2017-08-23 NOTE — Anesthesia Procedure Notes (Signed)
Procedure Name: Intubation Date/Time: 08/23/2017 10:47 AM Performed by: Marny Lowensteinapozzi, Jahsir Rama W, CRNA Pre-anesthesia Checklist: Patient identified, Emergency Drugs available, Suction available, Patient being monitored and Timeout performed Patient Re-evaluated:Patient Re-evaluated prior to induction Oxygen Delivery Method: Circle system utilized Preoxygenation: Pre-oxygenation with 100% oxygen Induction Type: IV induction Ventilation: Mask ventilation without difficulty Laryngoscope Size: Miller and 2 Grade View: Grade I Tube type: Oral Tube size: 7.5 mm Number of attempts: 1 Airway Equipment and Method: Stylet Placement Confirmation: ETT inserted through vocal cords under direct vision,  positive ETCO2,  CO2 detector and breath sounds checked- equal and bilateral Secured at: 22 cm Tube secured with: Tape Dental Injury: Teeth and Oropharynx as per pre-operative assessment

## 2017-08-23 NOTE — Progress Notes (Signed)
Hospitalist progress note   Reva Boresroy Tall Jr.  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  Followed by neurology Left heart cath 09/2010-  Admit 08/16/2017 right foot pain increased pain on walking MRI did not show any involvement of bone Vascular surgery consulted-was supposed to have arteriogram performed 3/14 Found to be in acute renal failure baseline creatinine 1.3-1.6 on admission 60/2.5, Mild leukocytosis of 10 Creatinine imporved with hydration VVS did Angio and is going to have bypass surgery 3/19 am  Assessment & Plan:   Assessment:  The encounter diagnosis was Peripheral vascular disease (HCC).  Gangrene of right foot-defer to vascular surgeon, ABI shows severe arterial insuff.   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 and is n.p.o.-he understands the procedure risks and benefits Will follow along post-op  Acute kidney injury-holding Diovan HCT not on any other diuretics.  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, folic acid deficiency-continue same in addition to gabapentin 100 3 times daily  Psoriasis-started itching on 3/16--Giving atarax 10 tid-->25 as still itchy, starting betamethasone 0.1 to skin--feel maybe opiates causing itching-rash is better overall without any new findings   bradycardia-cardiology decreasing Toprol to 25 daily and increasing amlodipine to 10--off tele since 3/16  Hyperlipidemia-LDL  117 Lipitor increased to 40 needs fasting lipid panel in 3 months  Diabetes mellitus type 2-does not appear to be on meds at this time--he doesn't have this diagnosis according to him--sugars checks have been stopped  Iron deficiency anemia continue 325 mg 3 times daily  DVT prophylaxis: lovenox  Code Status:   full   Family Communication:   No family present today Disposition Plan: for surgery 3/19 and possible SNF d/c once post-op eval done by therapy   Consultants:   vasc  Procedures:   Stress test 3/15 Study Result      There was no ST segment deviation noted during stress.  No T wave inversion was noted during stress.  The left ventricular ejection fraction is normal (55-65%).  Nuclear stress EF: 57%.  The study is normal.   1. EF 57%, normal wall motion.  2. No ischemia or infarction by perfusion images.     Normal study.       Antimicrobials:    vacn/zosyn 3/12   Subjective:  Well NPO for procedure No new c/o  Objective: Vitals:   08/22/17 1156 08/22/17 2027 08/23/17 0354 08/23/17 0823  BP: (!) 144/88 (!) 137/59 128/78 138/63  Pulse:   (!) 52   Resp: 18     Temp:  98 F (36.7 C) 97.6 F (36.4 C) 98.1 F (36.7 C)  TempSrc:  Oral Oral Oral  SpO2:  98% 98%   Weight:   94.3 kg (207 lb 12.8 oz)   Height:        Intake/Output Summary (Last 24 hours) at 08/23/2017 0849 Last data filed at 08/23/2017 0841 Gross per 24 hour  Intake 1210 ml  Output 350 ml  Net 860 ml   Filed Weights   08/21/17 0358 08/22/17 0414 08/23/17 0354  Weight: 94.3 kg (207 lb 12.8 oz) 94.4 kg (208 lb 3.2 oz) 94.3 kg (207 lb 12.8 oz)    Examination:  Alert oriented EOMI NCAT, no distress Chest clear Rash about the same Abdomen soft nontender Wound not examined today   Data Reviewed: I have personally reviewed following labs and imaging studies  CBC: Recent Labs  Lab 08/18/17 0440 08/23/17 0221  WBC 9.0 6.2  HGB 12.1* 11.0*  HCT 37.8* 35.0*  MCV 98.4 98.9   PLT 224 168   Basic Metabolic Panel: Recent Labs  Lab 08/17/17 0523 08/18/17 0440 08/19/17 0430 08/21/17 0231 08/23/17 0221  NA 136 138 137 137 135  K 4.0 4.7 4.2 4.3 4.2  CL 105 104 109 107 102  CO2 22 27 22 24 25   GLUCOSE 96 96 100* 99 93  BUN 35* 29* 23* 22* 18  CREATININE 1.58* 1.49* 1.47* 1.53* 1.52*  CALCIUM 8.6* 8.7* 8.4* 8.4* 8.5*  PHOS 2.9  --   --  2.9  --    GFR: Estimated Creatinine Clearance: 49.2 mL/min (A) (by C-G formula based on SCr of 1.52 mg/dL (H)). Liver Function Tests: Recent Labs  Lab 08/17/17 0523 08/21/17 0231  ALBUMIN 2.5* 2.2*   No results for input(s): LIPASE, AMYLASE in the last 168 hours. No results for input(s): AMMONIA in the last 168 hours. Coagulation Profile: No results for input(s): INR, PROTIME in the last 168 hours. Cardiac Enzymes: No results for input(s): CKTOTAL, CKMB, CKMBINDEX, TROPONINI in the last 168 hours. CBG: No results for input(s): GLUCAP in the last 168 hours. Urine analysis:    Component Value Date/Time   COLORURINE YELLOW 08/16/2017 0458   APPEARANCEUR CLEAR 08/16/2017 0458   LABSPEC 1.019 08/16/2017 0458   PHURINE 5.0 08/16/2017 0458   GLUCOSEU NEGATIVE 08/16/2017 0458   HGBUR NEGATIVE 08/16/2017 0458   BILIRUBINUR NEGATIVE 08/16/2017 0458   KETONESUR NEGATIVE 08/16/2017 0458   PROTEINUR NEGATIVE 08/16/2017 0458   UROBILINOGEN 0.2 09/17/2010 1354   NITRITE NEGATIVE 08/16/2017 0458   LEUKOCYTESUR NEGATIVE 08/16/2017 0458     Radiology Studies: Reviewed images personally in health database    Scheduled Meds: . amLODipine  10 mg Oral Daily  . aspirin EC  81 mg Oral Daily  . atorvastatin  40 mg Oral q1800  . DULoxetine  30 mg Oral Daily  . feeding supplement (PRO-STAT SUGAR FREE 64)  30 mL Oral Daily  . ferrous sulfate  325 mg Oral TID WC  . folic acid  5 mg Oral Daily  . gabapentin  100 mg Oral TID  . hydrOXYzine  25 mg Oral TID  . metoprolol succinate  25 mg Oral Daily  . sodium chloride flush   3 mL Intravenous Q12H  . triamcinolone cream   Topical BID  . vitamin B-12  1,000 mcg Oral Daily   Continuous Infusions: . sodium chloride 100 mL/hr at 08/18/17 0505  . sodium chloride    . piperacillin-tazobactam (ZOSYN)  IV Stopped (08/23/17 0604)  . vancomycin Stopped (08/22/17 2356)     LOS: 8 days    Time spent: 25    Pleas Koch, MD [facility]   If 7PM-7AM, please contact night-coverage [email or web address] Password [facility] 08/23/2017, 8:49 AM

## 2017-08-23 NOTE — Anesthesia Postprocedure Evaluation (Signed)
Anesthesia Post Note  Patient: Reva Boresroy Mowers Jr.  Procedure(s) Performed: BYPASS GRAFT ABOVE THE KNEE POPLITEAL TO BELOW THE KNEE POPLITEAL RIGHT USING PROPATEN GORE VASCULAR GRAFT 6MM X 40MM (Right Leg Lower) AMPUTATION FOURTH AND FIFTH TOES RIGHT FOOT, Debridement of Right Heal and third toe. (Right Foot)     Patient location during evaluation: PACU Anesthesia Type: General Level of consciousness: awake and alert Pain management: pain level controlled Vital Signs Assessment: post-procedure vital signs reviewed and stable Respiratory status: spontaneous breathing, nonlabored ventilation, respiratory function stable and patient connected to nasal cannula oxygen Cardiovascular status: blood pressure returned to baseline and stable Postop Assessment: no apparent nausea or vomiting Anesthetic complications: no    Last Vitals:  Vitals:   08/23/17 1725 08/23/17 2034  BP: (!) 150/57 (!) 145/70  Pulse: (!) 55 (!) 51  Resp: 15 18  Temp: 36.7 C 36.7 C  SpO2: 99% 97%    Last Pain:  Vitals:   08/23/17 2034  TempSrc: Oral  PainSc:    Pain Goal: Patients Stated Pain Goal: 0 (08/23/17 1930)               Babe Anthis

## 2017-08-23 NOTE — Op Note (Signed)
OPERATIVE NOTE   PROCEDURE: 1. Right above-the-knee popliteal artery to below-the-knee popliteal artery bypass with Propaten 2. 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 Debridement of right medial heel ulcer 4. 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  Acceptable above-the-knee popliteal artery inflow (some calcification and tightening) and acceptable below-the-knee popliteal artery target (some calcification) 2.  Poor right greater saphenous vein vein conduit 3.  Dopplerable anterior tibial artery > peroneal artery > posterior tibial artery   SPECIMEN(S):  Anaerobic and aerobic cultures of foot wound adjacent to heads to metatarsals 4-5, anaerobic and aerobic cultures of toes 4-5  INDICATIONS:   Brandon Boresroy Wiesen Jr. is a 77 y.o. 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 I recommended: right above-the-knee to below-the-knee popliteal artery bypass and amputation of right 4th and 5th toes. The risk, benefits, and alternative for these operations were discussed with the patient.  The patient is aware the risks include but are not limited to: bleeding, infection, myocardial infarction, stroke, limb loss, nerve damage, limb edema, need for additional procedures in the future, wound complications, and inability to complete the bypass.   The patient is aware of these risks and agreed to proceed.  DESCRIPTION: After obtaining full informed written consent, the patient was brought back to the operating room and placed supine upon the operating table.  The patient received IV antibiotics prior to induction.  A procedure time out was completed and the correct surgical site was  verified.  After obtaining adequate anesthesia, the patient was prepped and draped in the standard fashion for: right leg bypass and toe amputations.    I turned my attention to the distal thigh.  I placed bump under the right knee and them made an incision over Hunter's canal.  I dissected down through the substantial fatty subcutaneous tissue and then opened the fascia.  I tend dissected toward the neurovascular bundle and easily found the above-the-knee popliteal artery artery segment with a pulse.  I dissected the surround veins off the artery and tried them off and transected them to get full exposure of a segment of the popliteal artery.  I placed vessel loops around this artery and placed wet lap pads in this incision.  I felt there was a pulse in this artery with limited calcification.  I then turned my attention to the calf.  I made an incision 1/2 finger width behind the tibia overlying the popliteal space.  I carefully dissected through the subcutaneous tissue, identifying the greater saphenous vein at this level.  I dissected out the vein through this calf segment and protected the vein.  I continued the dissection through the fascia and eventually identified the popliteal vein in the fatty deep tissue in the deep posterior compartment.  I dissected out the medial popliteal vein and placed a vessel loop.  I used this to help retract the below-the-knee popliteal artery vessels more superficial.  I identified the popliteal artery deep in this exposure.  I dissected off the adjacent veins and placed vessel loops around this below-the-knee popliteal artery.  I felt the artery was relatively disease free.  At this point, I continued the greater saphenous vein dissection.  I made two skip  incisions overlying the greater saphenous vein.  I dissected out the vein with sharply dissection and electrocautery.  Side branches were tied off with 3-0 and 4-0 silk ties.  I dissected from the calf to the level of  the above-the-knee popliteal artery exposure.  The vein appeared compressible and viable.  I tied off the proximal and distal greater saphenous vein and transected the vein.  I placed a vessel cannula in the distal vein and clamped the proximal vein.  This vein failed to hydrodistended, demonstrating significant sclerotic walls, making this vein conduit inadequate for bypass purposes.  The vein was set aside in case a vein patch was needed.  I dissected bluntly with my fingers from the below-the-knee popliteal space to the above-the-knee popliteal space and passed a ringed forceps.  I then pulled an umbilical tape behind the knee.  I then obtained 6 mm Propaten graft and then tied one end of the umbilical tape to the graft.  I pulled the graft behind the knee, taking care to maintain the orientation.    The patient was given 9500 units of Heparin intravenously, which was a therapeutic bolus.  I reset my above-the-knee popliteal artery exposure.  After waiting 3 minutes, I placed the above-the-knee popliteal artery under tension proximally and distally.  I made an arteriotomy and extended it with a Potts scissor.  This artery was diseased with calcific thickened wall.  The artery was still adequate for use as inflow.  I spatulated the proximal end of the Propaten graft for the arteriotomy.  I sewed the graft to the above-the-knee popliteal artery with a running stitch of CV-6 in an end to side configuration.  Prior to completion of anastomosis, I relaxed the vessel loops from each end.  There was strong pulsatile bleeding from the proximal end and limited backbleeding from the distal end.  I completed this anastomosis in the usual fashion.  I then released the vessel loops.  There was strong pulse in the graft.  I allowed the graft to bleed: pulsatile bleeding was present.  I clamped the graft in the above-the-knee exposure and then washed out the graft.  This above-the-knee popliteal artery exposure was packed  with Avitene.    I turned my attention the below-the-knee popliteal artery exposure.  I reset the exposure and then identified a soft segment in this artery.  I placed the artery under tension proximally and distally and then made an arteriotomy and extended it proximally and distally.  I extended the right knee and identified the length of graft needed.  I spatulated the graft to this length.  The distal end of the graft was sewed the below-the-knee popliteal artery artery in an end-to-side configuration with a running stitch of CV-6.  Prior to completing this anastomosis, I backbled each end of the below-the-knee popliteal artery: limited bleeding was noted.  I allowed the graft to bleeding in an antegrade fashion: pulsatile bleeding was present.  I completed the anastomosis in the usual fashion.  I washed out this below-the-knee popliteal artery exposure and packed it with Avitene.    The patient was given 50 mg of Protamine to reverse anticoagulation.  After waiting a few minutes, the above-the-knee popliteal artery exposure was washed out.  A segment of the suture line had relaxed likely due to fracture of calcific wall.  I tightened up the suture line and repaired the loose segment.  This aborted any further bleeding.  The above-the-knee exposure was washed out and repacked with Avitene.  This resulted in cessation of active bleeding.  The Avitene was removed and then the deep tissue reapproximated with 2-0 Vicryl.  The superficial subcutaneous tissue was reapproximated with a running stitch of 3-0 Vicryl.  The skin was reapproximated with a running subcuticular of 4-0 Monocryl.  The skin was cleaned, dried, and reinforced with Dermabond.  I turned my attention to the calf.  There was no further active bleeding in this exposure.  I removed the Avitene and washed out the exposure.  A few venous bleeding points were controlled with electrocautery.  The deep fascia was reapproximated with 2-0 Vicryl  stitches.  The subcutaneous tissue was reapproximated with a 3-0 Vicryl.  The skin was reapproximated with a running subcuticular of 4-0 Monocryl.  The skin was cleaned, dried, and reinforced with Dermabond.  The two vein harvest incisions were washed out.  No active bleeding was present in either incision.  The subcutaneous tissue in each incision was reapproximated with a 3-0 Vicryl.  The skin in each incision was reapproximated with a running subcuticular of 4-0 Monocryl.  The skin in both incisions was cleaned, dried, and reinforced with Dermabond.  At this point, the right leg was draped with sterile towels.  I turned my attention to the right foot.  There was addition to the prior 4th and 5th toe gangrene, also a necrotic medial heel and lateral 3rd toe ulcers now.  I made an elliptical incision around the right 4th and 5th toes.  There was vigorous bleeding from the plantar and dorsal incision lines.  I dissected down to the metatarsal-proximal phalange joints for the right 4th and 5th toes, releasing the R 4th and 5th toes.  I took an anaerobic and aerobic culture of this deep tissue within the right foot.  There was too much tension in the wound, so I felt resection of the right 4th and 5th metatarsal heads was necessary.  Using a rongeur, I removed the heads of the right 4th and 5th metatarsals.  This allowed me to close the amputation wound with multiple 2-0 Vicryl and 2-0 Nylon stitches.    I then sharply debrided the lateral necrotic ulcer on the right 3rd toe with a 15 blade.  I then turned my attention to the right medial heel.  I sharply debrided the back eschar in this ulcer with a 10 blade.  The right leg was washed off.  Sterile bandages were applied to the 4th and 5th toes and secured with a Kerlix.  A wet to dry dressings were applied to the right heel ulcer and 3rd toe ulcers.   These were also secured with the Kerlix.  The right foot was wrapped with an ACE bandage.  By the end of  this case, there were dopplerable tibial signals:  anterior tibial artery > peroneal artery > posterior tibial artery.   COMPLICATIONS: none  CONDITION: stable   Leonides Sake, MD, [facility] [place] Office: [phone] Pager: [phone]  08/23/2017, 3:04 PM

## 2017-08-24 ENCOUNTER — Inpatient Hospital Stay (HOSPITAL_COMMUNITY): Payer: Medicare Other

## 2017-08-24 ENCOUNTER — Encounter (HOSPITAL_COMMUNITY): Payer: Self-pay | Admitting: Vascular Surgery

## 2017-08-24 DIAGNOSIS — N183 Chronic kidney disease, stage 3 (moderate): Secondary | ICD-10-CM

## 2017-08-24 DIAGNOSIS — I70201 Unspecified atherosclerosis of native arteries of extremities, right leg: Secondary | ICD-10-CM | POA: Diagnosis present

## 2017-08-24 DIAGNOSIS — I96 Gangrene, not elsewhere classified: Secondary | ICD-10-CM | POA: Diagnosis present

## 2017-08-24 DIAGNOSIS — Z9889 Other specified postprocedural states: Secondary | ICD-10-CM

## 2017-08-24 DIAGNOSIS — N179 Acute kidney failure, unspecified: Secondary | ICD-10-CM

## 2017-08-24 LAB — CBC WITH DIFFERENTIAL/PLATELET
BASOS ABS: 0 10*3/uL (ref 0.0–0.1)
Basophils Relative: 0 %
EOS ABS: 0.5 10*3/uL (ref 0.0–0.7)
EOS PCT: 8 %
HCT: 31.8 % — ABNORMAL LOW (ref 39.0–52.0)
HEMOGLOBIN: 9.8 g/dL — AB (ref 13.0–17.0)
LYMPHS ABS: 0.7 10*3/uL (ref 0.7–4.0)
LYMPHS PCT: 10 %
MCH: 30.4 pg (ref 26.0–34.0)
MCHC: 30.8 g/dL (ref 30.0–36.0)
MCV: 98.8 fL (ref 78.0–100.0)
Monocytes Absolute: 1.1 10*3/uL — ABNORMAL HIGH (ref 0.1–1.0)
Monocytes Relative: 17 %
NEUTROS PCT: 65 %
Neutro Abs: 4.4 10*3/uL (ref 1.7–7.7)
PLATELETS: 160 10*3/uL (ref 150–400)
RBC: 3.22 MIL/uL — ABNORMAL LOW (ref 4.22–5.81)
RDW: 13.9 % (ref 11.5–15.5)
WBC: 6.7 10*3/uL (ref 4.0–10.5)

## 2017-08-24 LAB — RENAL FUNCTION PANEL
ALBUMIN: 2.2 g/dL — AB (ref 3.5–5.0)
Anion gap: 8 (ref 5–15)
BUN: 14 mg/dL (ref 6–20)
CHLORIDE: 102 mmol/L (ref 101–111)
CO2: 25 mmol/L (ref 22–32)
Calcium: 8.2 mg/dL — ABNORMAL LOW (ref 8.9–10.3)
Creatinine, Ser: 1.55 mg/dL — ABNORMAL HIGH (ref 0.61–1.24)
GFR, EST AFRICAN AMERICAN: 49 mL/min — AB (ref >60)
GFR, EST NON AFRICAN AMERICAN: 42 mL/min — AB (ref >60)
Glucose, Bld: 101 mg/dL — ABNORMAL HIGH (ref 65–99)
PHOSPHORUS: 3.7 mg/dL (ref 2.5–4.6)
POTASSIUM: 4.8 mmol/L (ref 3.5–5.1)
Sodium: 135 mmol/L (ref 135–145)

## 2017-08-24 MED ORDER — ENSURE ENLIVE PO LIQD
237.0000 mL | Freq: Two times a day (BID) | ORAL | Status: DC
  Administered 2017-08-25 – 2017-08-26 (×2): 237 mL via ORAL

## 2017-08-24 MED ORDER — ORAL CARE MOUTH RINSE
15.0000 mL | Freq: Two times a day (BID) | OROMUCOSAL | Status: DC
  Administered 2017-08-26: 15 mL via OROMUCOSAL

## 2017-08-24 NOTE — Progress Notes (Addendum)
   Daily Progress Note   Assessment/Planning:   POD #1 s/p R ATK to BTK pop BPG w/ Propaten, Debridement of 3rd toe ulcer and medial heel ulcer, R 4th-5th ray amp   Dressing dry: will take down tomorrow and start wound care tomorrow  Tibial signals greatly augmented  Finish 10 day course of abx as precaution given prosthetic graft utilized   Subjective  - 1 Day Post-Op   Pain controlled   Objective   Vitals:   08/23/17 1725 08/23/17 2034 08/24/17 0001 08/24/17 0404  BP: (!) 150/57 (!) 145/70 132/64 132/61  Pulse: (!) 55 (!) 51 (!) 57 (!) 54  Resp: 15 18 (!) 21 16  Temp: 98 F (36.7 C) 98 F (36.7 C) 98.3 F (36.8 C) 98.5 F (36.9 C)  TempSrc:  Oral Oral Oral  SpO2: 99% 97% 97% 98%  Weight:    220 lb 10.9 oz (100.1 kg)  Height:         Intake/Output Summary (Last 24 hours) at 08/24/2017 0827 Last data filed at 08/24/2017 0406 Gross per 24 hour  Intake 1950 ml  Output 1110 ml  Net 840 ml    PULM  CTAB  CV  RRR  GI  soft, NTND  VASC R foot dressed without any active blood on dsg, strongly dopplerable AT =  peroneal > PTA  NEURO Motor in foot intact, sensation grossly intact    Laboratory   CBC CBC Latest Ref Rng & Units 08/24/2017 08/23/2017 08/18/2017  WBC 4.0 - 10.5 K/uL 6.7 6.2 9.0  Hemoglobin 13.0 - 17.0 g/dL 2.9(F9.8(L) 11.0(L) 12.1(L)  Hematocrit 39.0 - 52.0 % 31.8(L) 35.0(L) 37.8(L)  Platelets 150 - 400 K/uL 160 168 224    BMET    Component Value Date/Time   NA 135 08/24/2017 0251   K 4.8 08/24/2017 0251   CL 102 08/24/2017 0251   CO2 25 08/24/2017 0251   GLUCOSE 101 (H) 08/24/2017 0251   BUN 14 08/24/2017 0251   CREATININE 1.55 (H) 08/24/2017 0251   CREATININE 1.38 (H) 08/09/2017 0948   CALCIUM 8.2 (L) 08/24/2017 0251   GFRNONAA 42 (L) 08/24/2017 0251   GFRNONAA 50 (L) 08/09/2017 0948   GFRAA 49 (L) 08/24/2017 0251   GFRAA 58 (L) 08/09/2017 [id number]     Leonides SakeBrian Chen, MD, FACS [facility] [place] Office:  [phone] Pager: [phone]  08/24/2017, 8:27 AM

## 2017-08-24 NOTE — Evaluation (Signed)
Occupational Therapy Evaluation Patient Details Name: [name] Ke Jr. MRN: [id number] DOB: 06/03/1941 Today's Date: 08/24/2017    History of Present Illness Pt is a 77 y.o. 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 PMHx: Degenerative arthritis of spine, Hyperlipidemia, HTN, Carotid endarterectomy 2012.   Clinical Impression   Pt reports he was independent with ADL PTA. Currently pt requires heavy min assist for sit to stand at EOB and has difficulty weight shifting to take steps at side of bed. Pt requires max assist for LB ADL. Recommending SNF for follow up at this time, however, pending progress pt may be able to d/c home with family assist. Pt would benefit from continued skilled OT to address established goals.    Follow Up Recommendations  SNF    Equipment Recommendations  3 in 1 bedside commode    Recommendations for Other Services PT consult     Precautions / Restrictions Precautions Precautions: Fall Restrictions Weight Bearing Restrictions: No      Mobility Bed Mobility Overal bed mobility: Needs Assistance Bed Mobility: Supine to Sit;Sit to Supine     Supine to sit: Min guard Sit to supine: Min guard   General bed mobility comments: Min guard for safety. Use of bed rail with HOB flat  Transfers Overall transfer level: Needs assistance Equipment used: Rolling walker (2 wheeled) Transfers: Sit to/from Stand Sit to Stand: Min assist         General transfer comment: Pt pulling up on RW with bil UEs; min assist to boost up from EOB.    Balance Overall balance assessment: Needs assistance Sitting-balance support: Feet supported;No upper extremity supported Sitting balance-Leahy Scale: Good     Standing balance support: Bilateral upper extremity supported Standing balance-Leahy Scale: Poor                             ADL either  performed or assessed with clinical judgement   ADL Overall ADL's : Needs assistance/impaired Eating/Feeding: Set up;Sitting   Grooming: Set up;Supervision/safety;Sitting   Upper Body Bathing: Set up;Supervision/ safety;Sitting   Lower Body Bathing: Maximal assistance;Sit to/from stand   Upper Body Dressing : Set up;Supervision/safety;Sitting   Lower Body Dressing: Maximal assistance;Sit to/from stand                 General ADL Comments: Limited mobility performed this session due to pain--pt with difficulty [facility].     Vision         Perception     Praxis      Pertinent Vitals/Pain Pain Assessment: 0-10 Pain Score: 7  Pain Location: R foot Pain Descriptors / Indicators: Burning;Aching Pain Intervention(s): Limited activity within patient's tolerance;Monitored during session;Repositioned;RN gave pain meds during session     Hand Dominance     Extremity/Trunk Assessment Upper Extremity Assessment Upper Extremity Assessment: Generalized weakness   Lower Extremity Assessment Lower Extremity Assessment: Defer to PT evaluation       Communication Communication Communication: No difficulties   Cognition Arousal/Alertness: Awake/alert Behavior During Therapy: WFL for tasks assessed/performed Overall Cognitive Status: Within Functional Limits for tasks assessed                                     General  Comments  SpO2 in high 80s/low 90s on RA during activity; reapplied supplemental O2.    Exercises     Shoulder Instructions      Home Living Family/patient expects to be discharged to:: Private residence Living Arrangements: Alone Available Help at Discharge: Family;Available PRN/intermittently Type of Home: House Home Access: Stairs to enter [employer] of Steps: 2   Home Layout: One level     Bathroom Shower/Tub: Producer, television/film/video: Standard     Home  Equipment: Environmental consultant - 2 wheels;Cane - single point;Grab bars - tub/shower          Prior Functioning/Environment Level of Independence: Independent with assistive device(s)        Comments: RW vs Cane for mobility. Independent with ADL.        OT Problem List: Decreased strength;Decreased range of motion;Decreased activity tolerance;Impaired balance (sitting and/or standing);Decreased knowledge of use of DME or AE;Decreased knowledge of precautions;Impaired UE functional use;Pain;Increased edema      OT Treatment/Interventions: Self-care/ADL training;DME and/or AE instruction;Energy conservation;Therapeutic exercise;Therapeutic activities;Patient/family education;Balance training    OT Goals(Current goals can be found in the care plan section) Acute Rehab OT Goals Patient Stated Goal: get better OT Goal Formulation: With patient Time For Goal Achievement: 09/07/17 Potential to Achieve Goals: Good ADL Goals Pt Will Perform Lower Body Bathing: with supervision;sit to/from stand Pt Will Perform Lower Body Dressing: with supervision;sit to/from stand Pt Will Transfer to Toilet: with min assist;ambulating;bedside commode Pt Will Perform Toileting - Clothing Manipulation and hygiene: with supervision;sit to/from stand  OT Frequency: Min 2X/week   Barriers to D/C: Decreased caregiver support  pt lives alone       Co-evaluation              AM-PAC PT "6 Clicks" Daily Activity     Outcome Measure Help from another person eating meals?: None Help from another person taking care of personal grooming?: A Little Help from another person toileting, which includes using toliet, bedpan, or urinal?: A Lot Help from another person bathing (including washing, rinsing, drying)?: A Lot Help from another person to put on and taking off regular upper body clothing?: A Little Help from another person to put on and taking off regular lower body clothing?: A Lot 6 Click Score: 16   End of  Session Equipment Utilized During Treatment: Rolling walker;Oxygen Nurse Communication: Mobility status  Activity Tolerance: Patient tolerated treatment well Patient left: in bed;with call bell/phone within reach;with family/visitor present;with nursing/sitter in room  OT Visit Diagnosis: Other abnormalities of gait and mobility (R26.89);Pain Pain - Right/Left: Right Pain - part of body: Leg;Ankle and joints of foot                Time: 1191-4782 OT Time Calculation (min): 21 min Charges:  OT General Charges $OT Visit: 1 Visit OT Evaluation $OT Eval Moderate Complexity: 1 Mod G-Codes:     Zanyiah Posten A. Brett Albino, M.S., OTR/L Pager: [phone]  Gaye Alken 08/24/2017, 10:09 AM

## 2017-08-24 NOTE — Progress Notes (Signed)
ABI's have been completed. Right 0.91  Left 1  08/24/17 4:39 PM Olen CordialGreg Sharine Cadle RVT

## 2017-08-24 NOTE — Evaluation (Signed)
Physical Therapy Evaluation Patient Details Name: Brandon Boresroy Lartigue Jr. MRN: [id number] DOB: 12/08/1940 Today's Date: 08/24/2017   History of Present Illness  Pt is a 77 y.o. 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 PMHx: Degenerative arthritis of spine, Hyperlipidemia, HTN, Carotid endarterectomy 2012.  Clinical Impression   Patient received in bed, very pleasant and willing to participate in PT this afternoon. He requires Min guard with extended time to complete functional bed mobility today as well as assist for line management; he is more fatigued this afternoon, and required heavy ModA for functional transfers today. Attempted to take 5-6 very small side steps along the edge of the bed, which was very difficult for patient to perform, however he was able to perform lateral scooting along the edge of the bed in sitting position with min guard. He will continue to benefit from skilled PT services in the acute setting, but will strongly benefit from ST-SNF to address functional deficits and for optimal safety moving forward.     Follow Up Recommendations SNF    Equipment Recommendations  None recommended by PT(defer to next venue )    Recommendations for Other Services       Precautions / Restrictions Precautions Precautions: Fall Restrictions Weight Bearing Restrictions: No Other Position/Activity Restrictions: no documented WB precautions       Mobility  Bed Mobility Overal bed mobility: Needs Assistance Bed Mobility: Supine to Sit;Sit to Supine     Supine to sit: Min guard Sit to supine: Min guard   General bed mobility comments: Min guard for safety and line management, extended time   Transfers Overall transfer level: Needs assistance Equipment used: Rolling walker (2 wheeled) Transfers: Sit to/from Stand Sit to Stand: Mod assist         General transfer  comment: heavy ModA for functional transfer (patient reports he is much more fatigued than during session earlier), VC for correct hand placement; able to laterally scoot along edge of bed with Min guard and line management from PT   Ambulation/Gait             General Gait Details: great difficulty taking small side steps along EOB, difficulty bearing weight on painful R LE/foot; able to take 5-6 very small side steps with RW; SpO2 drop to 75% during standing activity, quickly recovered to 90s with pursed lip breathing on supplemental O2   Stairs            Wheelchair Mobility    Modified Rankin (Stroke Patients Only)       Balance Overall balance assessment: Needs assistance Sitting-balance support: Feet supported;Bilateral upper extremity supported Sitting balance-Leahy Scale: Good     Standing balance support: During functional activity;Bilateral upper extremity supported Standing balance-Leahy Scale: Poor                               Pertinent Vitals/Pain Pain Assessment: 0-10 Pain Score: 6  Pain Location: R foot Pain Descriptors / Indicators: Burning;Aching Pain Intervention(s): Limited activity within patient's tolerance;Monitored during session;Repositioned    Home Living Family/patient expects to be discharged to:: Private residence Living Arrangements: Alone Available Help at Discharge: Family;Available PRN/intermittently Type of Home: House Home Access: Stairs to enter   Entrance Stairs-Number of Steps: 2 Home Layout: One level Home Equipment: Walker - 2 wheels;Cane - single point;Grab bars -  tub/shower      Prior Function Level of Independence: Independent with assistive device(s)         Comments: RW vs Cane for mobility. Independent with ADL.     Hand Dominance        Extremity/Trunk Assessment   Upper Extremity Assessment Upper Extremity Assessment: Defer to OT evaluation    Lower Extremity Assessment Lower  Extremity Assessment: Generalized weakness    Cervical / Trunk Assessment Cervical / Trunk Assessment: Kyphotic  Communication   Communication: No difficulties  Cognition Arousal/Alertness: Awake/alert Behavior During Therapy: WFL for tasks assessed/performed Overall Cognitive Status: Within Functional Limits for tasks assessed                                        General Comments General comments (skin integrity, edema, etc.): SpO2 in 90s at resting baseline, dropped to 75% with standing activities, quickly recovered to 94% with seated rest/pursed lip breathing     Exercises     Assessment/Plan    PT Assessment Patient needs continued PT services  PT Problem List Decreased strength;Decreased mobility;Decreased safety awareness;Decreased coordination;Decreased knowledge of precautions;Decreased activity tolerance;Pain;Decreased balance       PT Treatment Interventions DME instruction;Therapeutic activities;Gait training;Therapeutic exercise;Patient/family education;Stair training;Balance training;Functional mobility training;Neuromuscular re-education    PT Goals (Current goals can be found in the Care Plan section)  Acute Rehab PT Goals Patient Stated Goal: get better PT Goal Formulation: With patient Time For Goal Achievement: 09/07/17 Potential to Achieve Goals: Fair    Frequency Min 2X/week   Barriers to discharge        Co-evaluation               AM-PAC PT "6 Clicks" Daily Activity  Outcome Measure Difficulty turning over in bed (including adjusting bedclothes, sheets and blankets)?: Unable Difficulty moving from lying on back to sitting on the side of the bed? : Unable Difficulty sitting down on and standing up from a chair with arms (e.g., wheelchair, bedside commode, etc,.)?: Unable Help needed moving to and from a bed to chair (including a wheelchair)?: A Lot Help needed walking in hospital room?: A Lot Help needed climbing 3-5 steps  with a railing? : Total 6 Click Score: 8    End of Session Equipment Utilized During Treatment: Gait belt;Oxygen Activity Tolerance: Patient limited by fatigue;Patient limited by pain Patient left: in bed;with call bell/phone within reach   PT Visit Diagnosis: Unsteadiness on feet (R26.81);Muscle weakness (generalized) (M62.81);Difficulty in walking, not elsewhere classified (R26.2);Pain Pain - Right/Left: Right Pain - part of body: Leg    Time: 1351-1415 PT Time Calculation (min) (ACUTE ONLY): 24 min   Charges:   PT Evaluation $PT Eval Low Complexity: 1 Low PT Treatments $Therapeutic Activity: 8-22 mins   PT G Codes:        Nedra Hai PT, DPT, CBIS  Supplemental Physical Therapist [facility]   Pager [phone]

## 2017-08-24 NOTE — Progress Notes (Signed)
PROGRESS NOTE    [name].  RUE:[id number] DOB: Dec 19, 1940 DOA: 08/15/2017 PCP: Brandon Becton, MD   Brief Narrative:  Patient is a 77 year old gentleman history of type 2 diabetes, ongoing tobacco abuse, peripheral vascular disease status post left carotid endarterectomy 2012, hypertension, hyperlipidemia who was admitted 08/16/2017 secondary to right foot pain which worsened on walking.  Vascular surgery consulted and patient noted to have a gangrene his right foot with rest pain and risk for limb loss as well as acute renal failure on chronic kidney disease.  Patient's ARB was discontinued and patient hydrated gently with improvement with renal function.  Patient also underwent a ABI.  Patient was supposed to have an arteriogram performed on 3/14 however found to be in acute on chronic kidney disease with baseline creatinine 1.3-1.6 on admission which was 60/2.5 with a mild leukocytosis.  Renal function improved with hydration and close to baseline.  Patient subsequently underwent angiogram and underwent a right above-knee popliteal artery to below the knee popliteal artery bypass as well as ray amputation fourth and fifth toes and debridement of right medial heel ulcer and debridement of right third toe ulcer 08/23/2017 per Dr. Imogene Pierce.   Assessment & Plan:   Principal Problem:   Ischemic foot Active Problems:   Gangrene of right foot (HCC)   Popliteal artery occlusion, right (HCC)   CIGARETTE SMO[employer]ER   Essential hypertension, benign   Chronic diastolic heart failure (HCC)   ARF (acute renal failure) (HCC)   Peripheral vascular disease (HCC)   Preoperative clearance   PAD (peripheral artery disease) (HCC)   Acute renal failure superimposed on stage 3 chronic kidney disease (HCC)  #1 right foot gangrene with popliteal artery occlusion/ischemic foot/PAD Status post right above-the-knee popliteal artery to below the knee popliteal artery bypass, ray amputation of right fourth and  fifth toes, debridement of right medial heel ulcer, debridement of right toe ulcer per Dr. Johny Pierce 08/23/2017.  Continue current pain management.   Continue empiric IV vancomycin and Zosyn to complete a 10-day course of antibiotic treatment per vascular surgery due to prosthetic graft which was utilized.  Continue aspirin, Lipitor.  Per vascular surgery.  2.  Acute on chronic kidney disease stage III (baseline creatinine 1.3-1.6) On admission patient noted to have a creatinine as high as 2.52 with a BUN of 60.  Felt likely to be secondary to prerenal azotemia in the setting of ARB and diuretic.  ARB and diuretic have been discontinued.  Patient hydrated with IV fluids with improvement in renal function and currently close to baseline.  Follow.  3.  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  Will check a vitamin B12 and folic acid level.  Continue gabapentin 100 mg 3 times daily.  Continue folic acid, vitamin B12 supplementation.  4.  Bradycardia Patient seen in consultation by cardiology who have decreased patient's Toprol to 25 mg daily.  Norvasc has been increased to 10 mg daily.  Follow.  5.  Hyperlipidemia LDL of 117.  Lipitor was increased to 40 mg daily.  Will need a fasting lipid panel done in 3 months.  6.  ??? Diabetes mellitus type 2 Likely diet controlled.  Per prior attending patient does not have a diagnosis of diabetes according to him and CBGs were discontinued.  Hemoglobin A1c was 5.3 on 08/09/2017.  Outpatient follow-up.  7.  Psoriasis Patient started itching 08/20/2017 placed on Atarax 10 mg 3 times daily which was subsequently increased to 25 mg 3  times daily.  Betamethasone was added to patient's regimen.  Some improvement.  Follow.  8.  Iron deficiency anemia Check an anemia panel.  Hemoglobin currently at 9.8.  Continue oral iron supplementation.  Follow H&H.   DVT prophylaxis: Heparin Code Status: Full Family Communication: Updated patient  and daughter at bedside. Disposition Plan: Per vascular surgery.   Consultants:   Vascular surgery: Dr. Darrick Pierce 08/15/2017  Cardiology: Dr. Mayford Pierce 08/18/2017  Procedures:   Plain films of the right foot 08/15/2017  Myoview stress test 08/19/2017--- normal study  2D echo 08/20/2017  Abdominal aortogram with lower extremity 08/18/2017--- left renal artery occlusion, right above-the-knee popliteal occlusion, rest of right leg arterial system patent  ABI with/without TBI 08/16/2017  Lower extremity vein mapping 08/17/2017  Left common femoral artery cannulation under ultrasound guidance, placement of catheter in the aorta, aortogram, second order arterial selection, right leg runoff, Dr. Leonides Pierce 08/18/2017   Right above knee popliteal artery to below the knee popliteal artery bypass with proper pain, ray amputation of right fourth and fifth toes, debridement of right medial heel ulcer, debridement of right third toe ulcer per Dr. Imogene Pierce 08/23/2017.  Antimicrobials:   IV vancomycin 08/16/2017  IV Zosyn 08/16/2017   Subjective: Laying in bed stating that he feels bad today secondary to right lower extremity pain.  Denies any chest pain.  No shortness of breath.  States just received some pain medication about 40 minutes ago.  Objective: Vitals:   08/23/17 2034 08/24/17 0001 08/24/17 0404 08/24/17 0912  BP: (!) 145/70 132/64 132/61 (!) 144/67  Pulse: (!) 51 (!) 57 (!) 54 64  Resp: 18 (!) 21 16 17   Temp: 98 F (36.7 C) 98.3 F (36.8 C) 98.5 F (36.9 C) 99.4 F (37.4 C)  TempSrc: Oral Oral Oral Oral  SpO2: 97% 97% 98% 95%  Weight:   100.1 kg (220 lb 10.9 oz)   Height:        Intake/Output Summary (Last 24 hours) at 08/24/2017 1105 Last data filed at 08/24/2017 0919 Gross per 24 hour  Intake 2070 ml  Output 935 ml  Net 1135 ml   Filed Weights   08/22/17 0414 08/23/17 0354 08/24/17 0404  Weight: 94.4 kg (208 lb 3.2 oz) 94.3 kg (207 lb 12.8 oz) 100.1 kg (220 lb 10.9 oz)     Examination:  General exam: Appears calm and comfortable  Respiratory system: Clear to auscultation anterior lung fields. Respiratory effort normal. Cardiovascular system: S1 & S2 heard, RRR. No JVD, murmurs, rubs, gallops or clicks. No pedal edema. Gastrointestinal system: Abdomen is nondistended, soft and nontender. No organomegaly or masses felt. Normal bowel sounds heard. Central nervous system: Alert and oriented. No focal neurological deficits. Extremities: Right lower extremity incision sites clean/dry/intact.  Symmetric 5 x 5 power. Skin: No rashes, lesions or ulcers Psychiatry: Judgement and insight appear normal. Mood & affect appropriate.     Data Reviewed: I have personally reviewed following labs and imaging studies  CBC: Recent Labs  Lab 08/18/17 0440 08/23/17 0221 08/24/17 0251  WBC 9.0 6.2 6.7  NEUTROABS  --   --  4.4  HGB 12.1* 11.0* 9.8*  HCT 37.8* 35.0* 31.8*  MCV 98.4 98.9 98.8  PLT 224 168 160   Basic Metabolic Panel: Recent Labs  Lab 08/18/17 0440 08/19/17 0430 08/21/17 0231 08/23/17 0221 08/24/17 0251  NA 138 137 137 135 135  K 4.7 4.2 4.3 4.2 4.8  CL 104 109 107 102 102  CO2 27 22 24  25  25  GLUCOSE 96 100* 99 93 101*  BUN 29* 23* 22* 18 14  CREATININE 1.49* 1.47* 1.53* 1.52* 1.55*  CALCIUM 8.7* 8.4* 8.4* 8.5* 8.2*  PHOS  --   --  2.9  --  3.7   GFR: Estimated Creatinine Clearance: 49.6 mL/min (A) (by C-G formula based on SCr of 1.55 mg/dL (H)). Liver Function Tests: Recent Labs  Lab 08/21/17 0231 08/24/17 0251  ALBUMIN 2.2* 2.2*   No results for input(s): LIPASE, AMYLASE in the last 168 hours. No results for input(s): AMMONIA in the last 168 hours. Coagulation Profile: No results for input(s): INR, PROTIME in the last 168 hours. Cardiac Enzymes: No results for input(s): CKTOTAL, CKMB, CKMBINDEX, TROPONINI in the last 168 hours. BNP (last 3 results) No results for input(s): PROBNP in the last 8760 hours. HbA1C: No results  for input(s): HGBA1C in the last 72 hours. CBG: Recent Labs  Lab 08/23/17 1534  GLUCAP 79   Lipid Profile: No results for input(s): CHOL, HDL, LDLCALC, TRIG, CHOLHDL, LDLDIRECT in the last 72 hours. Thyroid Function Tests: No results for input(s): TSH, T4TOTAL, FREET4, T3FREE, THYROIDAB in the last 72 hours. Anemia Panel: No results for input(s): VITAMINB12, FOLATE, FERRITIN, TIBC, IRON, RETICCTPCT in the last 72 hours. Sepsis Labs: No results for input(s): PROCALCITON, LATICACIDVEN in the last 168 hours.  Recent Results (from the past 240 hour(s))  Blood culture (routine x 2)     Status: None   Collection Time: 08/15/17 10:02 PM  Result Value Ref Range Status   Specimen Description   Final    BLOOD BLOOD RIGHT HAND Performed at [facility], [address] [place], [place] [address]    Special Requests   Final    IN PEDIATRIC BOTTLE Blood Culture adequate volume Performed at [facility], [address] [place], [place] [address]    Culture   Final    NO GROWTH 5 DAYS Performed at [facility], [address] [place], [place] [address]    Report Status 08/21/2017 FINAL  Final  Blood culture (routine x 2)     Status: None   Collection Time: 08/15/17 10:08 PM  Result Value Ref Range Status   Specimen Description   Final    BLOOD LEFT ANTECUBITAL Performed at [facility], [address] [place], [place] [address]    Special Requests   Final    BOTTLES DRAWN AEROBIC AND ANAEROBIC Blood Culture adequate volume Performed at [facility], [address] [place], [place] [address]    Culture   Final    NO GROWTH 5 DAYS Performed at [facility], [address] [place], [place] [address]    Report Status 08/21/2017 FINAL  Final  Surgical pcr screen     Status: None   Collection Time: 08/23/17  2:16 AM  Result Value Ref Range Status   MRSA, PCR NEGATIVE NEGATIVE Final    Staphylococcus aureus NEGATIVE NEGATIVE Final    Comment: (NOTE) The Xpert SA Assay (FDA approved for NASAL specimens in patients 76 years of age and older), is one component of a comprehensive surveillance program. It is not intended to diagnose infection nor to guide or monitor treatment. Performed at [facility], [address] [place], [place] [address]   Aerobic/Anaerobic Culture (surgical/deep wound)     Status: None (Preliminary result)   Collection Time: 08/23/17  2:23 PM  Result Value Ref Range Status  Specimen Description WOUND RIGHT TOE FOURTH ANS FIFITH TOE  Final   Special Requests SPE A ON SWABS POF ZOSYN  Final   Gram Stain   Final    FEW WBC PRESENT,BOTH PMN AND MONONUCLEAR NO ORGANISMS SEEN Performed at [facility], [address] [place], [place] [address]    Culture PENDING  Incomplete   Report Status PENDING  Incomplete  Aerobic/Anaerobic Culture (surgical/deep wound)     Status: None (Preliminary result)   Collection Time: 08/23/17  2:36 PM  Result Value Ref Range Status   Specimen Description TISSUE RIGHT TOE FOURTH AND FIFTH TOE  Final   Special Requests SPEC B POF ZOSYN  Final   Gram Stain   Final    FEW WBC PRESENT,BOTH PMN AND MONONUCLEAR NO ORGANISMS SEEN Performed at [facility], [address], [place], [place] [address]    Culture PENDING  Incomplete   Report Status PENDING  Incomplete         Radiology Studies: No results found.      Scheduled Meds: . amLODipine  10 mg Oral Daily  . aspirin EC  81 mg Oral Daily  . atorvastatin  40 mg Oral q1800  . docusate sodium  100 mg Oral Daily  . DULoxetine  30 mg Oral Daily  . feeding supplement (PRO-STAT SUGAR FREE 64)  30 mL Oral Daily  . ferrous sulfate  325 mg Oral TID WC  . folic acid  5 mg Oral Daily  . gabapentin  100 mg Oral TID  . heparin  5,000 Units Subcutaneous Q8H  . hydrOXYzine  25 mg Oral TID  . metoprolol succinate  25 mg Oral Daily  . pantoprazole   40 mg Oral Daily  . sodium chloride flush  3 mL Intravenous Q12H  . triamcinolone cream   Topical BID  . vitamin B-12  1,000 mcg Oral Daily   Continuous Infusions: . sodium chloride    . sodium chloride 75 mL/hr at 08/24/17 0950  . sodium chloride    . magnesium sulfate 1 - 4 g bolus IVPB    . piperacillin-tazobactam (ZOSYN)  IV 3.375 g (08/24/17 0946)  . vancomycin Stopped (08/24/17 0155)     LOS: 9 days    Time spent: 40 minutes    Ramiro Harvest, MD [facility] Pager [phone]  If 7PM-7AM, please contact night-coverage [email or web address] Password [email or web address] 08/24/2017, 11:05 AM

## 2017-08-24 NOTE — Progress Notes (Signed)
Pharmacy Antibiotic Note  Reva Boresroy Cazeau Jr. is a 77 y.o. male admitted on 08/15/2017 with R foot pain thought to be gangrene. Pharmacy consulted to dose vancomycin/zosyn for cellulitis - day #8, Vascular continuing for now, likely planning 10 days. Afebrile, WBC wnl. S/p Vascular intervention on 3/19.   SCr improved from admission, now stable at 1.55, UOP 0.4 per documentation. Vancomycin dose reduced on 3/17 due to trough of 23.  Plan: -Continue vancomycin at 1250mg  IV q24h  -Zosyn 3.375 g IV q8h (4h infusion) -Monitor clinical progress, c/s, renal function, vancomycin trough at new steady state 3/20 -F/u de-escalation plan/LOT  Height: 5\' 11"  (180.3 cm) Weight: 220 lb 10.9 oz (100.1 kg) IBW/kg (Calculated) : 75.3  Temp (24hrs), Avg:98.5 F (36.9 C), Min:98 F (36.7 C), Max:99.4 F (37.4 C)  Recent Labs  Lab 08/18/17 0440 08/19/17 0430 08/21/17 0231 08/21/17 2000 08/23/17 0221 08/24/17 0251  WBC 9.0  --   --   --  6.2 6.7  CREATININE 1.49* 1.47* 1.53*  --  1.52* 1.55*  VANCOTROUGH  --   --   --  23*  --   --     Estimated Creatinine Clearance: 49.6 mL/min (A) (by C-G formula based on SCr of 1.55 mg/dL (H)).    Allergies  Allergen Reactions  . Pollen Extract    Antimicrobials this admission: 3/12 vancomycin > 3/12 zosyn >  Dose adjustments this admission: 3/13 - empirically adjusted vanc for improving SCr 3/17 VT - 23 on 1500mg  q24h, decreased  Microbiology results: 3/11 BCx: neg 3/19 R toe wound A: ngtd 3/19 R toe wound B: ngtd  Babs BertinHaley Jamarco Zaldivar, PharmD, BCPS Clinical Pharmacist Clinical phone for 08/24/2017 until 3:30pm: [phone] If after 3:30pm, please call main pharmacy at: [phone] 08/24/2017 10:37 AM

## 2017-08-24 NOTE — Progress Notes (Addendum)
Nutrition Follow Up  DOCUMENTATION CODES:   Not applicable  INTERVENTION:    D/C Prostat po daily  Ensure Enlive po BID, each supplement provides 350 kcal and 20 grams of protein  NUTRITION DIAGNOSIS:   Increased nutrient needs related to wound healing as evidenced by estimated needs, ongoing  GOAL:   Patient will meet greater than or equal to 90% of their needs, unmet  MONITOR:   PO intake, Supplement acceptance, Labs, Skin, Weight trends, I & O's  ASSESSMENT:   77 y.o. Male with history of HTN, carotid endarterectomy and ongoing tobacco abuse has been experiencing increasing pain and swelling in the right foot for the last 3-4 weeks.   Pt s/p procedures 3/19: Right above-the-knee popliteal artery to below-the-knee popliteal artery bypass  Ray amputation of right 4th and 5th toes Debridement of right medial heel ulcer Debridement of right 3rd toe ulcer  Pt in VASCULAR LAB. PO intake poor at 25% per flowsheets. Per MAR, pt's has been mostly refusing Prostat supplements.  Will add Ensure Enlive to help maximize PO intake. Hopefully he'll like.   Medications: folvite and vitamin B12. Labs reviewed. CBG 79.  Diet Order:  Diet regular Room service appropriate? Yes; Fluid consistency: Thin  EDUCATION NEEDS:   Not appropriate for education at this time  Skin:  Skin Assessment: Skin Integrity Issues: Skin Integrity Issues:: Other (Comment) Other: s/p ray amputation of R 4th & 5th toes  Last BM:  3/18  Height:   Ht Readings from Last 1 Encounters:  08/18/17 5\' 11"  (1.803 m)    Weight:   Wt Readings from Last 1 Encounters:  08/24/17 220 lb 10.9 oz (100.1 kg)    Ideal Body Weight:  78.1 kg  BMI:  Body mass index is 30.78 kg/m.  Estimated Nutritional Needs:   Kcal:  1900-2100  Protein:  90-105 gm  Fluid:  1.9-2.1 L  Maureen ChattersKatie Orbie Grupe, RD, LDN Pager #: [phone] After-Hours Pager #: [phone]

## 2017-08-25 DIAGNOSIS — I70201 Unspecified atherosclerosis of native arteries of extremities, right leg: Secondary | ICD-10-CM

## 2017-08-25 DIAGNOSIS — N183 Chronic kidney disease, stage 3 (moderate): Secondary | ICD-10-CM

## 2017-08-25 DIAGNOSIS — I96 Gangrene, not elsewhere classified: Secondary | ICD-10-CM

## 2017-08-25 LAB — BASIC METABOLIC PANEL
ANION GAP: 8 (ref 5–15)
BUN: 16 mg/dL (ref 6–20)
CO2: 25 mmol/L (ref 22–32)
Calcium: 7.9 mg/dL — ABNORMAL LOW (ref 8.9–10.3)
Chloride: 102 mmol/L (ref 101–111)
Creatinine, Ser: 1.67 mg/dL — ABNORMAL HIGH (ref 0.61–1.24)
GFR calc Af Amer: 45 mL/min — ABNORMAL LOW (ref >60)
GFR, EST NON AFRICAN AMERICAN: 38 mL/min — AB (ref >60)
Glucose, Bld: 114 mg/dL — ABNORMAL HIGH (ref 65–99)
POTASSIUM: 4 mmol/L (ref 3.5–5.1)
SODIUM: 135 mmol/L (ref 135–145)

## 2017-08-25 LAB — VITAMIN B12: VITAMIN B 12: 936 pg/mL — AB (ref 180–914)

## 2017-08-25 LAB — IRON AND TIBC
IRON: 16 ug/dL — AB (ref 45–182)
SATURATION RATIOS: 9 % — AB (ref 17.9–39.5)
TIBC: 181 ug/dL — AB (ref 250–450)
UIBC: 165 ug/dL

## 2017-08-25 LAB — FERRITIN: Ferritin: 217 ng/mL (ref 24–336)

## 2017-08-25 LAB — CBC
HCT: 32.8 % — ABNORMAL LOW (ref 39.0–52.0)
Hemoglobin: 9.9 g/dL — ABNORMAL LOW (ref 13.0–17.0)
MCH: 30.3 pg (ref 26.0–34.0)
MCHC: 30.2 g/dL (ref 30.0–36.0)
MCV: 100.3 fL — AB (ref 78.0–100.0)
PLATELETS: 165 10*3/uL (ref 150–400)
RBC: 3.27 MIL/uL — AB (ref 4.22–5.81)
RDW: 14.3 % (ref 11.5–15.5)
WBC: 6.9 10*3/uL (ref 4.0–10.5)

## 2017-08-25 LAB — FOLATE: FOLATE: 51.3 ng/mL (ref >5.9)

## 2017-08-25 LAB — VANCOMYCIN, TROUGH: VANCOMYCIN TR: 20 ug/mL (ref 15–20)

## 2017-08-25 MED ORDER — COLLAGENASE 250 UNIT/GM EX OINT
TOPICAL_OINTMENT | Freq: Every day | CUTANEOUS | Status: DC
  Administered 2017-08-25: 1 via TOPICAL
  Administered 2017-08-26: 10:00:00 via TOPICAL
  Filled 2017-08-25: qty 30

## 2017-08-25 MED ORDER — HYDROCHLOROTHIAZIDE 12.5 MG PO CAPS
12.5000 mg | ORAL_CAPSULE | Freq: Every day | ORAL | Status: DC
  Administered 2017-08-25 – 2017-08-26 (×2): 12.5 mg via ORAL
  Filled 2017-08-25 (×2): qty 1

## 2017-08-25 MED ORDER — VANCOMYCIN HCL IN DEXTROSE 1-5 GM/200ML-% IV SOLN
1000.0000 mg | INTRAVENOUS | Status: DC
  Administered 2017-08-25: 1000 mg via INTRAVENOUS
  Filled 2017-08-25 (×2): qty 200

## 2017-08-25 NOTE — Progress Notes (Signed)
Right foot dressing change completed. Aquacel AG applied to 4th and 5th toe amputation sites, as well as 3rd toe ulcer. Santyl applied to right heel ulcer. Moist gauze placed, with kerlix and ACE wrap. Pt tolerated well. Will continue current plan of care.    Berdine DanceLauren Moffitt BSN, RN

## 2017-08-25 NOTE — Progress Notes (Signed)
Pharmacy Antibiotic Note  Brandon Boresroy Beckmann Jr. is a 77 y.o. male admitted on 08/15/2017 with R foot pain thought to be gangrene. Pharmacy consulted to dose vancomycin/zosyn for cellulitis - day #8, Vascular continuing for now, likely planning 10 days. Afebrile, WBC wnl. S/p Vascular intervention on 3/19.   Vancomycin trough returned at 20 today. This is therapeutic, I expect some accumulation, therefore I will reduce the dose.  Plan: -Reduce vancomycin to 1 g IV q24h  -Zosyn 3.375 g IV q8h (4h infusion) -Monitor clinical progress, c/s, renal function, VT as needed -F/u de-escalation plan/LOT  Height: 5\' 11"  (180.3 cm) Weight: 220 lb 10.9 oz (100.1 kg) IBW/kg (Calculated) : 75.3  Temp (24hrs), Avg:98.7 F (37.1 C), Min:97.9 F (36.6 C), Max:99.4 F (37.4 C)  Recent Labs  Lab 08/18/17 0440 08/19/17 0430 08/21/17 0231 08/21/17 2000 08/23/17 0221 08/24/17 0251 08/24/17 2255  WBC 9.0  --   --   --  6.2 6.7  --   CREATININE 1.49* 1.47* 1.53*  --  1.52* 1.55*  --   VANCOTROUGH  --   --   --  23*  --   --  20    Estimated Creatinine Clearance: 49.6 mL/min (A) (by C-G formula based on SCr of 1.55 mg/dL (H)).    Allergies  Allergen Reactions  . Pollen Extract    Antimicrobials this admission: 3/12 vancomycin > 3/12 zosyn >  Dose adjustments this admission: 3/13 - empirically adjusted vanc for improving SCr 3/17 VT: 23 on 1500mg  q24h, decreased 3/20 VT: 20  Microbiology results: 3/11 BCx: neg 3/19 R toe wound A: ngtd 3/19 R toe wound B: ngtd   Baldemar FridayMasters, Leyton Brownlee M 08/25/2017 12:18 AM

## 2017-08-25 NOTE — Clinical Social Work Note (Signed)
Clinical Social Work Assessment  Patient Details  Name: [name]. MRN: [id number] Date of Birth: 18-Jan-1941  Date of referral:  08/25/17               Reason for consult:  Discharge Planning, Facility Placement                Permission sought to share information with:  Family Supports Permission granted to share information::  Yes, Verbal Permission Granted  Name::     Leonor Liv  Agency::  snf  Relationship::  daughter  Contact Information:  [phone]  Housing/Transportation Living arrangements for the past 2 months:  Apartment Source of Information:  Patient Patient Interpreter Needed:  None Criminal Activity/Legal Involvement Pertinent to Current Situation/Hospitalization:  No - Comment as needed Significant Relationships:  Adult Children, Pets Lives with:  Pets, Self Do you feel safe going back to the place where you live?  Yes Need for family participation in patient care:  Yes (Comment)  Care giving concerns:  No family at bedside. Patient was in the room and was very sleepy during assessment. Patient stated he lives by himself and has a dog. Patient stated he has support from his daughter   Facilities manager / plan:  CSW met patient at bedside to offer support and discharge needs. Patient was very sleepy during assessment but was still engaged with CSW. Patient stated he is agreeable to discharge to a short term rehab facility and would like daughter to look at facility list once bed offers are available . Patient gave CSW verbal permission to fax him out in the [place] area.   Employment status:  Retired Nurse, adult PT Recommendations:  [place] / Referral to community resources:  [place]  Patient/Family's Response to care:  Patient was very kind and polite during assessment. Patient thanked CSW role in care   Patient/Family's Understanding of and Emotional Response to  Diagnosis, Current Treatment, and Prognosis:  CSW to follow up with patient once bed offers are available  Emotional Assessment Appearance:  Appears stated age Attitude/Demeanor/Rapport:  Lethargic(pt was very sleepy ) Affect (typically observed):  Accepting Orientation:  Oriented to Self, Oriented to Place, Oriented to  Time, Oriented to Situation Alcohol / Substance use:  Not Applicable Psych involvement (Current and /or in the community):  No (Comment)  Discharge Needs  Concerns to be addressed:  No discharge needs identified Readmission within the last 30 days:  No Current discharge risk:    Barriers to Discharge:  No SNF bed, [place], LCSW 08/25/2017, 5:54 PM

## 2017-08-25 NOTE — Progress Notes (Signed)
PROGRESS NOTE    [name].  ZOX:[id number] DOB: [birth year] DOA: 08/15/2017 PCP: Monica Becton, MD   Brief Narrative:  Patient is a 77 year old gentleman history of type 2 diabetes, ongoing tobacco abuse, peripheral vascular disease status post left carotid endarterectomy 2012, hypertension, hyperlipidemia who was admitted 08/16/2017 secondary to right foot pain which worsened on walking.  Vascular surgery consulted and patient noted to have a gangrene his right foot with rest pain and risk for limb loss as well as acute renal failure on chronic kidney disease.  Patient's ARB was discontinued and patient hydrated gently with improvement with renal function.  Patient also underwent a ABI.  Patient was supposed to have an arteriogram performed on 3/14 however found to be in acute on chronic kidney disease with baseline creatinine 1.3-1.6 on admission which was 60/2.5 with a mild leukocytosis.  Renal function improved with hydration and close to baseline.  Patient subsequently underwent angiogram and underwent a right above-knee popliteal artery to below the knee popliteal artery bypass as well as ray amputation fourth and fifth toes and debridement of right medial heel ulcer and debridement of right third toe ulcer 08/23/2017 per Dr. Imogene Burn.   Assessment & Plan:   Principal Problem:   Ischemic foot Active Problems:   Gangrene of right foot (HCC)   Popliteal artery occlusion, right (HCC)   CIGARETTE SMOKER   Essential hypertension, benign   Chronic diastolic heart failure (HCC)   ARF (acute renal failure) (HCC)   Peripheral vascular disease (HCC)   Preoperative clearance   PAD (peripheral artery disease) (HCC)   Acute renal failure superimposed on stage 3 chronic kidney disease (HCC)  #1 right foot gangrene with popliteal artery occlusion/ischemic foot/PAD Status post right above-the-knee popliteal artery to below the knee popliteal artery bypass, ray amputation of right fourth and  fifth toes, debridement of right medial heel ulcer, debridement of right toe ulcer per Dr. Johny Drilling 08/23/2017.  Repeat ABIs have been done postoperatively and ABI right lower extremity currently at 0.91 from 0.37.  Continue current pain management.   Continue empiric IV vancomycin and Zosyn to complete a 10-day course of antibiotic treatment per vascular surgery due to prosthetic graft which was utilized.  Continue aspirin, Lipitor.  Per vascular surgery.  2.  Acute on chronic kidney disease stage III (baseline creatinine 1.3-1.6) On admission patient noted to have a creatinine as high as 2.52 with a BUN of 60.  Secondary to prerenal azotemia in the setting of ARB and diuretic.  ARB and diuretic currently on hold.  Patient has been hydrated with IV fluids which have currently been saline locked.  Creatinine currently at 1.67 and close to baseline.  Follow.   3.  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  Followed levels at 51.3.  Vitamin B12 levels at 936.  Continue folic acid and vitamin B12 supplementation.  Continue gabapentin.   4.  Bradycardia Patient seen in consultation by cardiology who have decreased patient's Toprol to 25 mg daily.  Norvasc has been increased to 10 mg daily.  Follow.  5.  Hyperlipidemia LDL of 117.  Continue current increased dose of Lipitor at 40 mg daily.  Repeat fasting lipid panel to be done in 3 months post discharge.   6.  Hyperglycemia Per prior attending patient does not have a diagnosis of diabetes according to him and CBGs were discontinued.  Hemoglobin A1c was 5.3 on 08/09/2017.   7.  Psoriasis Patient started itching 08/20/2017 placed on  Atarax 10 mg 3 times daily which was subsequently increased to 25 mg 3 times daily.  Betamethasone was added to patient's regimen.  Some improvement.  Follow.  8.  Iron deficiency anemia Anemia panel consistent with iron deficiency anemia.  Hemoglobin currently stable at 9.9.  Continue oral iron  supplementation.    DVT prophylaxis: Heparin Code Status: Full Family Communication: Updated patient and daughter at bedside. Disposition Plan: Okay to transfer to MedSurg however will defer to vascular surgery.   Consultants:   Vascular surgery: Dr. Darrick Penna 08/15/2017  Cardiology: Dr. Mayford Knife 08/18/2017  Procedures:   Plain films of the right foot 08/15/2017  Myoview stress test 08/19/2017--- normal study  2D echo 08/20/2017  Abdominal aortogram with lower extremity 08/18/2017--- left renal artery occlusion, right above-the-knee popliteal occlusion, rest of right leg arterial system patent  ABI with/without TBI 08/16/2017  Lower extremity vein mapping 08/17/2017  Left common femoral artery cannulation under ultrasound guidance, placement of catheter in the aorta, aortogram, second order arterial selection, right leg runoff, Dr. Leonides Sake 08/18/2017   Right above knee popliteal artery to below the knee popliteal artery bypass with proper pain, ray amputation of right fourth and fifth toes, debridement of right medial heel ulcer, debridement of right third toe ulcer per Dr. Imogene Burn 08/23/2017.  ABI 08/24/2017  Antimicrobials:   IV vancomycin 08/16/2017  IV Zosyn 08/16/2017   Subjective: Patient is on bedside commode.  Patient states pain in right lower extremity improved since admission.  No chest pain.  No shortness of breath.  Pain medication helping manage his pain.   Objective: Vitals:   08/25/17 0318 08/25/17 0430 08/25/17 0920 08/25/17 0925  BP: 126/87     Pulse: (!) 51 (!) 50  (!) 56  Resp: 20 13  11   Temp: 97.6 F (36.4 C)     TempSrc: Oral     SpO2: 96% 96% (!) 83% 95%  Weight:  98.1 kg (216 lb 4.3 oz)    Height:        Intake/Output Summary (Last 24 hours) at 08/25/2017 1044 Last data filed at 08/25/2017 0900 Gross per 24 hour  Intake 1771.75 ml  Output 500 ml  Net 1271.75 ml   Filed Weights   08/23/17 0354 08/24/17 0404 08/25/17 0430  Weight: 94.3 kg (207 lb  12.8 oz) 100.1 kg (220 lb 10.9 oz) 98.1 kg (216 lb 4.3 oz)    Examination:  General exam: Appears calm and comfortable  Respiratory system: Clear to auscultation bilaterally.  No wheezes, no crackles, no rhonchi.  Cardiovascular system: Regular rate and rhythm no murmurs rubs or gallops.  No JVD.  Trace to 1+ right lower extremity edema.  Gastrointestinal system: Abdomen is soft, nontender, nondistended, no rebound, no guarding.  Positive bowel sounds.   Central nervous system: Alert and oriented. No focal neurological deficits. Extremities: Right lower extremity incision sites clean/dry/intact.  Symmetric 5 x 5 power. Skin: No rashes, lesions or ulcers Psychiatry: Judgement and insight appear normal. Mood & affect appropriate.     Data Reviewed: I have personally reviewed following labs and imaging studies  CBC: Recent Labs  Lab 08/23/17 0221 08/24/17 0251 08/25/17 0223  WBC 6.2 6.7 6.9  NEUTROABS  --  4.4  --   HGB 11.0* 9.8* 9.9*  HCT 35.0* 31.8* 32.8*  MCV 98.9 98.8 100.3*  PLT 168 160 165   Basic Metabolic Panel: Recent Labs  Lab 08/19/17 0430 08/21/17 0231 08/23/17 0221 08/24/17 0251 08/25/17 0223  NA 137 137 135  135 135  K 4.2 4.3 4.2 4.8 4.0  CL 109 107 102 102 102  CO2 22 24 25 25 25   GLUCOSE 100* 99 93 101* 114*  BUN 23* 22* 18 14 16   CREATININE 1.47* 1.53* 1.52* 1.55* 1.67*  CALCIUM 8.4* 8.4* 8.5* 8.2* 7.9*  PHOS  --  2.9  --  3.7  --    GFR: Estimated Creatinine Clearance: 45.6 mL/min (A) (by C-G formula based on SCr of 1.67 mg/dL (H)). Liver Function Tests: Recent Labs  Lab 08/21/17 0231 08/24/17 0251  ALBUMIN 2.2* 2.2*   No results for input(s): LIPASE, AMYLASE in the last 168 hours. No results for input(s): AMMONIA in the last 168 hours. Coagulation Profile: No results for input(s): INR, PROTIME in the last 168 hours. Cardiac Enzymes: No results for input(s): CKTOTAL, CKMB, CKMBINDEX, TROPONINI in the last 168 hours. BNP (last 3  results) No results for input(s): PROBNP in the last 8760 hours. HbA1C: No results for input(s): HGBA1C in the last 72 hours. CBG: Recent Labs  Lab 08/23/17 1534  GLUCAP 79   Lipid Profile: No results for input(s): CHOL, HDL, LDLCALC, TRIG, CHOLHDL, LDLDIRECT in the last 72 hours. Thyroid Function Tests: No results for input(s): TSH, T4TOTAL, FREET4, T3FREE, THYROIDAB in the last 72 hours. Anemia Panel: Recent Labs    08/25/17 0223  VITAMINB12 936*  FOLATE 51.3  FERRITIN 217  TIBC 181*  IRON 16*   Sepsis Labs: No results for input(s): PROCALCITON, LATICACIDVEN in the last 168 hours.  Recent Results (from the past 240 hour(s))  Blood culture (routine x 2)     Status: None   Collection Time: 08/15/17 10:02 PM  Result Value Ref Range Status   Specimen Description   Final    BLOOD BLOOD RIGHT HAND Performed at [facility], [address] [place], [place] [address]    Special Requests   Final    IN PEDIATRIC BOTTLE Blood Culture adequate volume Performed at [facility], [address] [place], [place] [address]    Culture   Final    NO GROWTH 5 DAYS Performed at [facility], [address] [place], [place] [address]    Report Status 08/21/2017 FINAL  Final  Blood culture (routine x 2)     Status: None   Collection Time: 08/15/17 10:08 PM  Result Value Ref Range Status   Specimen Description   Final    BLOOD LEFT ANTECUBITAL Performed at [facility], [address] [place], [place] [address]    Special Requests   Final    BOTTLES DRAWN AEROBIC AND ANAEROBIC Blood Culture adequate volume Performed at [facility], [address] [place], [place] [address]    Culture   Final    NO GROWTH 5 DAYS Performed at [facility], [address] [place], [place] [address]    Report Status 08/21/2017 FINAL  Final  Surgical pcr screen     Status: None   Collection Time: 08/23/17   2:16 AM  Result Value Ref Range Status   MRSA, PCR NEGATIVE NEGATIVE Final   Staphylococcus aureus NEGATIVE NEGATIVE Final    Comment: (NOTE) The Xpert SA Assay (FDA approved for NASAL specimens in patients 76 years of age and older), is one component of a comprehensive surveillance program. It is not intended to diagnose infection nor to guide or monitor treatment. Performed at [facility], [address] [place],  [place] [address]   Aerobic/Anaerobic Culture (surgical/deep wound)     Status: None (Preliminary result)   Collection Time: 08/23/17  2:23 PM  Result Value Ref Range Status   Specimen Description WOUND RIGHT TOE FOURTH ANS FIFITH TOE  Final   Special Requests SPE A ON SWABS POF ZOSYN  Final   Gram Stain   Final    FEW WBC PRESENT,BOTH PMN AND MONONUCLEAR NO ORGANISMS SEEN    Culture   Final    CULTURE REINCUBATED FOR BETTER GROWTH Performed at [facility], [address], [place], [place] [address]    Report Status PENDING  Incomplete  Aerobic/Anaerobic Culture (surgical/deep wound)     Status: None (Preliminary result)   Collection Time: 08/23/17  2:36 PM  Result Value Ref Range Status   Specimen Description TISSUE RIGHT TOE FOURTH AND FIFTH TOE  Final   Special Requests SPEC B POF ZOSYN  Final   Gram Stain   Final    FEW WBC PRESENT,BOTH PMN AND MONONUCLEAR NO ORGANISMS SEEN    Culture   Final    CULTURE REINCUBATED FOR BETTER GROWTH Performed at [facility], [address] [place], [place] [address]    Report Status PENDING  Incomplete         Radiology Studies: No results found.      Scheduled Meds: . amLODipine  10 mg Oral Daily  . aspirin EC  81 mg Oral Daily  . atorvastatin  40 mg Oral q1800  . collagenase   Topical Daily  . docusate sodium  100 mg Oral Daily  . DULoxetine  30 mg Oral Daily  . feeding supplement (ENSURE ENLIVE)  237 mL Oral BID BM  . ferrous sulfate  325 mg Oral TID WC  . folic acid  5 mg Oral  Daily  . gabapentin  100 mg Oral TID  . heparin  5,000 Units Subcutaneous Q8H  . hydrochlorothiazide  12.5 mg Oral Daily  . hydrOXYzine  25 mg Oral TID  . mouth rinse  15 mL Mouth Rinse BID  . metoprolol succinate  25 mg Oral Daily  . pantoprazole  40 mg Oral Daily  . sodium chloride flush  3 mL Intravenous Q12H  . triamcinolone cream   Topical BID  . vitamin B-12  1,000 mcg Oral Daily   Continuous Infusions: . sodium chloride    . sodium chloride    . magnesium sulfate 1 - 4 g bolus IVPB    . piperacillin-tazobactam (ZOSYN)  IV Stopped (08/25/17 0806)  . vancomycin       LOS: 10 days    Time spent: 40 minutes    Ramiro Harvest, MD [facility] Pager [phone]  If 7PM-7AM, please contact night-coverage [email or web address] Password [facility] 08/25/2017, 10:44 AM

## 2017-08-25 NOTE — Consult Note (Signed)
WOC nurse consulted for wound care, however after review of chart Dr. Imogene Burnhen has given clear instructions in his notes.  I have transcribed wound care orders and placed wound care orders and orders for Santyl in patient's record.    Bedside nurses can apply and change dressings.   Re consult if needed, will not follow at this time. Thanks  Yoshi Mancillas M.D.[employer] MSN, RN,CWOCN, CNS, CWON-AP [phone])

## 2017-08-25 NOTE — Progress Notes (Addendum)
  Progress Note    08/25/2017 8:39 AM 2 Days Post-Op  Subjective:  R foot feels better compared to pre-operatively   Vitals:   08/25/17 0318 08/25/17 0430  BP: 126/87   Pulse: (!) 51 (!) 50  Resp: 20 13  Temp: 97.6 F (36.4 C)   SpO2: 96% 96%   Physical Exam: Lungs:  Non labored Incisions:  RLE graft incisions without bleeding or hematoma; R foot 4,5th toe amp site incision intact, some bleeding Extremities:  R AT/PT by doppler Abdomen:  Soft Neurologic: A&O  CBC    Component Value Date/Time   WBC 6.9 08/25/2017 0223   RBC 3.27 (L) 08/25/2017 0223   HGB 9.9 (L) 08/25/2017 0223   HCT 32.8 (L) 08/25/2017 0223   PLT 165 08/25/2017 0223   MCV 100.3 (H) 08/25/2017 0223   MCH 30.3 08/25/2017 0223   MCHC 30.2 08/25/2017 0223   RDW 14.3 08/25/2017 0223   LYMPHSABS 0.7 08/24/2017 0251   MONOABS 1.1 (H) 08/24/2017 0251   EOSABS 0.5 08/24/2017 0251   BASOSABS 0.0 08/24/2017 0251    BMET    Component Value Date/Time   NA 135 08/25/2017 0223   K 4.0 08/25/2017 0223   CL 102 08/25/2017 0223   CO2 25 08/25/2017 0223   GLUCOSE 114 (H) 08/25/2017 0223   BUN 16 08/25/2017 0223   CREATININE 1.67 (H) 08/25/2017 0223   CREATININE 1.38 (H) 08/09/2017 0948   CALCIUM 7.9 (L) 08/25/2017 0223   GFRNONAA 38 (L) 08/25/2017 0223   GFRNONAA 50 (L) 08/09/2017 0948   GFRAA 45 (L) 08/25/2017 0223   GFRAA 58 (L) 08/09/2017 0948    INR    Component Value Date/Time   INR 0.91 09/17/2010 1355     Intake/Output Summary (Last 24 hours) at 08/25/2017 0839 Last data filed at 08/25/2017 0322 Gross per 24 hour  Intake 1773.75 ml  Output 500 ml  Net 1273.75 ml     Assessment/Plan:  77 y.o. male is s/p R above to below knee popliteal bypass with PTFE, 4, 5 toe amp, heel debridement 2 Days Post-Op   R ABI 0.9 post operatively Aquacel Ag to 4,5th toe amp and 3rd toe ulcer; santyl to heel; wound nurse consulted Continue vanc and zosyn for full 10 days  Emilie RutterMatthew Eveland,  PA-C [facility] [phone] 08/25/2017 8:39 AM  Addendum  I have independently interviewed and examined the patient, and I agree with the physician assistant's findings.  Some dried blood at R 4-5th toe amp.  R 3rd toe looks clean.  Heel ulcer looks like some residual non-viable tissue.  Inc c/d/i.  ABI demonstrated restoration of blood flow: 0.37 ?  0.91   Santyl to heel BID.  W ? D dsg BID on top  Aquacel Ag to toe amputation wound and R 3rd toe ulcer  PT/OT  Finish abx course    Leonides SakeBrian Brinley Rosete, MD, FACS [facility] [place] Office: [phone] Pager: [phone]  08/25/2017, 8:53 AM

## 2017-08-26 LAB — CBC
HCT: 30.7 % — ABNORMAL LOW (ref 39.0–52.0)
HEMOGLOBIN: 9.5 g/dL — AB (ref 13.0–17.0)
MCH: 30.7 pg (ref 26.0–34.0)
MCHC: 30.9 g/dL (ref 30.0–36.0)
MCV: 99.4 fL (ref 78.0–100.0)
PLATELETS: 158 10*3/uL (ref 150–400)
RBC: 3.09 MIL/uL — ABNORMAL LOW (ref 4.22–5.81)
RDW: 14.2 % (ref 11.5–15.5)
WBC: 6.2 10*3/uL (ref 4.0–10.5)

## 2017-08-26 LAB — BASIC METABOLIC PANEL
Anion gap: 8 (ref 5–15)
BUN: 14 mg/dL (ref 6–20)
CHLORIDE: 102 mmol/L (ref 101–111)
CO2: 26 mmol/L (ref 22–32)
CREATININE: 1.51 mg/dL — AB (ref 0.61–1.24)
Calcium: 8.3 mg/dL — ABNORMAL LOW (ref 8.9–10.3)
GFR calc Af Amer: 50 mL/min — ABNORMAL LOW (ref >60)
GFR calc non Af Amer: 43 mL/min — ABNORMAL LOW (ref >60)
GLUCOSE: 111 mg/dL — AB (ref 65–99)
Potassium: 3.8 mmol/L (ref 3.5–5.1)
SODIUM: 136 mmol/L (ref 135–145)

## 2017-08-26 MED ORDER — SENNOSIDES-DOCUSATE SODIUM 8.6-50 MG PO TABS
1.0000 | ORAL_TABLET | Freq: Two times a day (BID) | ORAL | Status: DC
  Administered 2017-08-26: 1 via ORAL
  Filled 2017-08-26: qty 1

## 2017-08-26 MED ORDER — OXYCODONE HCL 5 MG PO TABS: 5.0000 mg | ORAL_TABLET | ORAL | 0 refills | Status: DC | PRN

## 2017-08-26 MED ORDER — FERUMOXYTOL INJECTION 510 MG/17 ML
510.0000 mg | Freq: Once | INTRAVENOUS | Status: AC
  Administered 2017-08-26: 510 mg via INTRAVENOUS
  Filled 2017-08-26: qty 17

## 2017-08-26 MED ORDER — METOPROLOL SUCCINATE ER 25 MG PO TB24: 25.0000 mg | ORAL_TABLET | Freq: Every day | ORAL | 0 refills | Status: DC

## 2017-08-26 MED ORDER — HYDROCHLOROTHIAZIDE 12.5 MG PO CAPS: 12.5000 mg | ORAL_CAPSULE | Freq: Every day | ORAL | 0 refills | Status: DC

## 2017-08-26 NOTE — [place] FL2 (Signed)
[place] MEDICAID FL2 LEVEL OF CARE SCREENING TOOL     IDENTIFICATION  Patient Name: [name]. Birthdate: [birth year] Sex: male Admission Date (Current Location): 08/15/2017  [facility] and [place] Number:  Producer, television/film/video and Address:  [facility] [employer], [address] [place], [place] [address]      Provider Number: [id number]  Attending Physician Name and Address:  [name], MD  Relative Name and Phone Number:  Ova Freshwater, [phone]    Current Level of Care: Hospital Recommended Level of Care: Skilled Nursing Facility Prior Approval Number:    Date Approved/Denied:   PASRR Number: [id number]  Discharge Plan: SNF    Current Diagnoses: Patient Active Problem List   Diagnosis Date Noted  . Gangrene of right foot (HCC) 08/24/2017  . 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 Acute renal failure superimposed on stage 3 chronic kidney disease (HCC)   . PAD (peripheral artery disease) (HCC) 08/23/2017  . Peripheral vascular disease (HCC)   . Preoperative clearance   . ARF (acute renal failure) (HCC) 08/16/2017  . Ischemic foot 08/15/2017  . Infection of right foot 08/09/2017  . Depressed mood 08/09/2017  . Allergic conjunctivitis 08/23/2016  . 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 Abnormality of gait 01/27/2015  . Chronic diastolic heart failure (HCC) 01/02/2015  . Preventive measure 01/05/2013  . Occlusion and stenosis of carotid artery without mention of cerebral infarction 11/12/2011  . Carotid stenosis 04/16/2011  . Lumbar degenerative disc disease 08/03/2010  . HEMORRHOIDS 03/11/2009  . Hyperlipidemia 01/29/2009  . CIGARETTE SMOKER 10/04/2008  . Essential hypertension, benign 10/04/2008    Orientation RESPIRATION BLADDER Height & Weight     Time, Self, Situation, Place  O2(2L) Continent Weight: 214 lb 8.1 oz (97.3 kg) Height:  5\' 11"  (180.3 cm)   BEHAVIORAL SYMPTOMS/MOOD NEUROLOGICAL BOWEL NUTRITION STATUS      Continent Diet(regular)  AMBULATORY STATUS COMMUNICATION OF NEEDS Skin   Limited Assist Verbally Surgical wounds(4th and 5th toe amputation )                       Personal Care Assistance Level of Assistance  Bathing, Feeding, Dressing Bathing Assistance: Limited assistance Feeding assistance: Independent Dressing Assistance: Limited assistance     Functional Limitations Info  Sight, Hearing, Speech Sight Info: Adequate Hearing Info: Adequate Speech Info: Adequate    SPECIAL CARE FACTORS FREQUENCY  PT (By licensed PT), OT (By licensed OT)     PT Frequency: 5x wk OT Frequency: 5x wk            Contractures Contractures Info: Not present    Additional Factors Info  Code Status, Allergies Code Status Info: Full Code Allergies Info: Pollen Extract           Current Medications (08/26/2017):  This is the current hospital active medication list Current Facility-Administered Medications  Medication Dose Route Frequency Provider Last Rate Last Dose  . 0.9 %  sodium chloride infusion  250 mL Intravenous PRN Leonides Sake L, MD      . 0.9 %  sodium chloride infusion  500 mL Intravenous Once PRN Rhyne, Ames Coupe, PA-C      . acetaminophen (TYLENOL) tablet 650 mg  650 mg Oral Q6H PRN Eduard Clos, MD       Or  . acetaminophen (TYLENOL) suppository 650 mg  650 mg Rectal Q6H PRN Eduard Clos, MD      .  amLODipine (NORVASC) tablet 10 mg  10 mg Oral Daily [place], [place], [place]   10 mg at 08/26/17 0945  . aspirin EC tablet 81 mg  81 mg Oral Daily Fransisco Hertzhen, Brian L, MD   81 mg at 08/26/17 0944  . atorvastatin (LIPITOR) tablet 40 mg  40 mg Oral q1800 Azalee CourseMeng, Hao, PA   40 mg at 08/25/17 1635  . bisacodyl (DULCOLAX) suppository 10 mg  10 mg Rectal Daily PRN Rhyne, Samantha J, PA-C      . collagenase (SANTYL) ointment   Topical Daily Fransisco Hertzhen, Brian L, MD      . DULoxetine (CYMBALTA) DR capsule 30 mg  30 mg Oral  Daily Eduard ClosKakrakandy, Arshad N, MD   30 mg at 08/26/17 0943  . feeding supplement (ENSURE ENLIVE) (ENSURE ENLIVE) liquid 237 mL  237 mL Oral BID BM Fransisco Hertzhen, Brian L, MD   237 mL at 08/26/17 0946  . ferrous sulfate tablet 325 mg  325 mg Oral TID WC Eduard ClosKakrakandy, Arshad N, MD   325 mg at 08/26/17 1211  . folic acid (FOLVITE) tablet 5 mg  5 mg Oral Daily Rhetta MuraSamtani, Jai-Gurmukh, MD   5 mg at 08/26/17 0943  . gabapentin (NEURONTIN) capsule 100 mg  100 mg Oral TID Rhetta MuraSamtani, Jai-Gurmukh, MD   100 mg at 08/26/17 0944  . guaiFENesin-dextromethorphan (ROBITUSSIN DM) 100-10 MG/5ML syrup 15 mL  15 mL Oral Q4H PRN Rhyne, Samantha J, PA-C      . heparin injection 5,000 Units  5,000 Units Subcutaneous Q8H Rhyne, Samantha J, PA-C   5,000 Units at 08/26/17 0538  . hydrALAZINE (APRESOLINE) injection 10 mg  10 mg Intravenous Q4H PRN Eduard ClosKakrakandy, Arshad N, MD   10 mg at 08/18/17 1942  . hydrALAZINE (APRESOLINE) injection 5 mg  5 mg Intravenous Q20 Min PRN Fransisco Hertzhen, Brian L, MD      . hydrochlorothiazide (MICROZIDE) capsule 12.5 mg  12.5 mg Oral Daily Rodolph Bonghompson, Daniel V, MD   12.5 mg at 08/26/17 0945  . hydrOXYzine (ATARAX/VISTARIL) tablet 25 mg  25 mg Oral TID Rhetta MuraSamtani, Jai-Gurmukh, MD   25 mg at 08/26/17 0944  . labetalol (NORMODYNE,TRANDATE) injection 10 mg  10 mg Intravenous Q10 min PRN Fransisco Hertzhen, Brian L, MD   10 mg at 08/18/17 1320  . magnesium sulfate IVPB 2 g 50 mL  2 g Intravenous Daily PRN Rhyne, Samantha J, PA-C      . MEDLINE mouth rinse  15 mL Mouth Rinse BID Fransisco Hertzhen, Brian L, MD   15 mL at 08/26/17 0946  . metoprolol succinate (TOPROL-XL) 24 hr tablet 25 mg  25 mg Oral Daily Rhyne, Samantha J, PA-C   25 mg at 08/25/17 1057  . metoprolol tartrate (LOPRESSOR) injection 2-5 mg  2-5 mg Intravenous Q2H PRN Rhyne, Samantha J, PA-C      . morphine 2 MG/ML injection 2 mg  2 mg Intravenous Q2H PRN Rhyne, Ames CoupeSamantha J, PA-C   2 mg at 08/25/17 [id number]  . ondansetron (ZOFRAN) tablet 4 mg  4 mg Oral Q6H PRN Eduard ClosKakrakandy, Arshad N, MD       Or  .  ondansetron [facility](ZOFRAN) injection 4 mg  4 mg Intravenous Q6H PRN Eduard ClosKakrakandy, Arshad N, MD      . oxyCODONE (Oxy IR/ROXICODONE) immediate release tablet 5-10 mg  5-10 mg Oral Q4H PRN Fransisco Hertzhen, Brian L, MD   10 mg at 08/26/17 1216  . pantoprazole (PROTONIX) EC tablet 40 mg  40 mg Oral Daily Rhyne, Samantha J, PA-C   40 mg at  08/26/17 0944  . phenol (CHLORASEPTIC) mouth spray 1 spray  1 spray Mouth/Throat PRN Rhyne, Samantha J, PA-C      . piperacillin-tazobactam (ZOSYN) IVPB 3.375 g  3.375 g Intravenous Q8H CollinsKara Mead M, PA-C 12.5 mL/hr at 08/26/17 1213 3.375 g at 08/26/17 1213  . polyethylene glycol (MIRALAX / GLYCOLAX) packet 17 g  17 g Oral Daily PRN Rhyne, Samantha J, PA-C      . potassium chloride SA (K-DUR,KLOR-CON) CR tablet 20-40 mEq  20-40 mEq Oral Daily PRN Rhyne, Samantha J, PA-C      . promethazine (PHENERGAN) tablet 25 mg  25 mg Oral Q6H PRN Eduard Clos, MD      . senna-docusate (Senokot-S) tablet 1 tablet  1 tablet Oral BID Rodolph Bong, MD   1 tablet at 08/26/17 [phone]  . sodium chloride flush (NS) 0.9 % injection 3 mL  3 mL Intravenous Q12H [name], MD   3 mL at 08/26/17 0948  . sodium chloride flush (NS) 0.9 % injection 3 mL  3 mL Intravenous PRN [name], MD      . triamcinolone cream (KENALOG) 0.1 %   Topical BID Rhetta Mura, MD      . vancomycin (VANCOCIN) IVPB 1000 mg/200 mL premix  1,000 mg Intravenous Q24H Clinton Gallant M, [place]   Stopped at 08/26/17 0015  . vitamin B-12 (CYANOCOBALAMIN) tablet 1,000 mcg  1,000 mcg Oral Daily Rhetta Mura, MD   1,000 mcg at 08/26/17 1191     Discharge Medications: Please see discharge summary for a list of discharge medications.  Relevant Imaging Results:  Relevant Lab Results:   Additional Information SS# [id number]  Althea Charon, LCSW

## 2017-08-26 NOTE — Care Management Note (Signed)
Case Management Note Donn PieriniKristi Hiya Point RN, BSN Unit 4E-Case Manager [phone]  Patient Details  Name: Brandon Boresroy Goggins Jr. MRN: [id number] Date of Birth: 09/17/1940  Subjective/Objective:   Pt admitted with gangrene of the right foot, PVD, AKI- s/p angiogram with popliteal occlusion- needs popliteal bypass- with 4-5 toe amputation. Cards consulted for cardiac clearance prior to vascular surgery                 Action/Plan: PTA pt lived at home alone- consult received regarding HH needs- spoke with pt at bedside- per pt [facility]- has DME at home that includes cane and RW- pt reports that [facility] but states that no one has come out to his home yet- reports that he does not know what agency was set up to come- CM reviewed chart and found that per Dr. Karie Schwalbe. Office note on 08/09/17- ambulatory referral was made to [facility] for [facility]- call [facility] to [facility] confirmed that they did receive referral from MD office however were unable to accept referral, spoke with Dr. Karie Schwalbe and then [facility]. [facility] on 08/12/17 at 1022 am.- Call then [facility] to Ad[facility] to f/u on referral- and she confirmed that she received a phone call from Dr. Karie Schwalbe office on 3/14 [facility]- and accepted referral (pt has not been seen yet as he came into the hospital at this point)- per Wynne DustAdacia- [facility] will follow for [facility] upon discharge- will need new orders at time of discharge-- CM will continue to follow for transition of care needs post surgery.   Expected Discharge Date:  08/26/17               Expected Discharge Plan:  Skilled Nursing Facility  In-House Referral:  Clinical Social Work  Discharge planning Services  CM Consult  Post Acute Care Choice:  Home Health, Resumption of Svcs/PTA Provider Choice offered to:  Patient  DME  Arranged:    DME Agency:     HH Arranged:  RN HH Agency:  [facility]  Status of Service:  Completed, signed off  If discussed at Long Length of Stay Meetings, dates discussed:    Discharge Disposition: skilled facility  Additional Comments:  08/26/17- 1130- Donn PieriniKristi Aziya Arena RN, CM- Pt is s/p popliteal bypass with heel ulcer debridement and 4/5 toe amputation on 3/19- [facility] recommended per PT eval- and pt agreeable to rehab. CSW following for placement needs- per MDs today pt is stable for discharge.- [facility]- [facility] Ad[facility]- they will f/u with pt at SNF. - CSW following for transition to SNF.   Darrold SpanWebster, Kashlyn Salinas Hall, RN 08/26/2017, 11:34 AM

## 2017-08-26 NOTE — Progress Notes (Signed)
PROGRESS NOTE    [name].  UEA:[id number] DOB: Oct 17, 1940 DOA: 08/15/2017 PCP: Monica Becton, MD   Brief Narrative:  Patient is a 77 year old gentleman history of type 2 diabetes, ongoing tobacco abuse, peripheral vascular disease status post left carotid endarterectomy 2012, hypertension, hyperlipidemia who was admitted 08/16/2017 secondary to right foot pain which worsened on walking.  Vascular surgery consulted and patient noted to have a gangrene his right foot with rest pain and risk for limb loss as well as acute renal failure on chronic kidney disease.  Patient's ARB was discontinued and patient hydrated gently with improvement with renal function.  Patient also underwent a ABI.  Patient was supposed to have an arteriogram performed on 3/14 however found to be in acute on chronic kidney disease with baseline creatinine 1.3-1.6 on admission which was 60/2.5 with a mild leukocytosis.  Renal function improved with hydration and close to baseline.  Patient subsequently underwent angiogram and underwent a right above-knee popliteal artery to below the knee popliteal artery bypass as well as ray amputation fourth and fifth toes and debridement of right medial heel ulcer and debridement of right third toe ulcer 08/23/2017 per Dr. Imogene Burn.   Assessment & Plan:   Principal Problem:   Ischemic foot Active Problems:   Gangrene of right foot (HCC)   Popliteal artery occlusion, right (HCC)   CIGARETTE SMOKER   Essential hypertension, benign   Chronic diastolic heart failure (HCC)   ARF (acute renal failure) (HCC)   Peripheral vascular disease (HCC)   Preoperative clearance   PAD (peripheral artery disease) (HCC)   Acute renal failure superimposed on stage 3 chronic kidney disease (HCC)  #1 right foot gangrene with popliteal artery occlusion/ischemic foot/PAD Status post right above-the-knee popliteal artery to below the knee popliteal artery bypass, ray amputation of right fourth and  fifth toes, debridement of right medial heel ulcer, debridement of right toe ulcer per Dr. Johny Drilling 08/23/2017.  Repeat ABIs have been done postoperatively and ABI right lower extremity currently at 0.91 from 0.37.  Continue current pain management.   Continue empiric IV vancomycin and Zosyn to complete a 10-day course of antibiotic treatment per vascular surgery due to prosthetic graft which was utilized.  Continue aspirin, Lipitor.  Per vascular surgery.  2.  Acute on chronic kidney disease stage III (baseline creatinine 1.3-1.6) On admission patient noted to have a creatinine as high as 2.52 with a BUN of 60.  Secondary to prerenal azotemia in the setting of ARB and diuretic.  ARB and diuretic were initially held.  HCTZ was resumed at 12.5 mg daily.  Renal function currently stable at 1.51.  Will recommend discharging off ARB.  Outpatient follow-up.   3.  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  Followed levels at 51.3.  Vitamin B12 levels at 936.  Continue folic acid and vitamin B12 supplementation.  Continue gabapentin.   4.  Bradycardia Patient seen in consultation by cardiology who have decreased patient's Toprol to 25 mg daily.  Norvasc has been increased to 10 mg daily.  Outpatient follow-up with cardiology.  Follow.  5.  Hyperlipidemia LDL of 117.  Been increased to 40 mg daily.  Will need repeat fasting lipid panel done in approximately 3 months.    6.  Hyperglycemia Per prior attending patient does not have a diagnosis of diabetes according to him and CBGs were discontinued.  Hemoglobin A1c was 5.3 on 08/09/2017.  Outpatient follow-up.  7.  Psoriasis Patient started itching 08/20/2017  placed on Atarax 10 mg 3 times daily which was subsequently increased to 25 mg 3 times daily.  Betamethasone was added to patient's regimen.  Some improvement.  Follow.  8.  Iron deficiency anemia Anemia panel consistent with iron deficiency anemia.  Hemoglobin currently stable at  9.9.  Give a dose of IV Feraheme.  Continue oral iron supplementation.    DVT prophylaxis: Heparin Code Status: Full Family Communication: Updated patient.  No family at bedside.   Disposition Plan: Okay to discharge from a medical standpoint.    Consultants:   Vascular surgery: Dr. Darrick PennaFields 08/15/2017  Cardiology: Dr. Mayford Knifeurner 08/18/2017  Procedures:   Plain films of the right foot 08/15/2017  Myoview stress test 08/19/2017--- normal study  2D echo 08/20/2017  Abdominal aortogram with lower extremity 08/18/2017--- left renal artery occlusion, right above-the-knee popliteal occlusion, rest of right leg arterial system patent  ABI with/without TBI 08/16/2017  Lower extremity vein mapping 08/17/2017  Left common femoral artery cannulation under ultrasound guidance, placement of catheter in the aorta, aortogram, second order arterial selection, right leg runoff, Dr. Leonides SakeBrian Chen 08/18/2017   Right above knee popliteal artery to below the knee popliteal artery bypass with proper pain, ray amputation of right fourth and fifth toes, debridement of right medial heel ulcer, debridement of right third toe ulcer per Dr. Imogene Burnhen 08/23/2017.  ABI 08/24/2017  Antimicrobials:   IV vancomycin 08/16/2017  IV Zosyn 08/16/2017   Subjective: Patient sleeping however easily arousable.  Denies any chest pain.  Denies any shortness of breath.  Right lower extremity pain currently being managed on current pain regimen.  Objective: Vitals:   08/26/17 0000 08/26/17 0354 08/26/17 0400 08/26/17 0827  BP: (!) 133/46 126/70  129/60  Pulse: (!) 51 (!) 56  (!) 56  Resp: 12 15    Temp:  97.8 F (36.6 C)    TempSrc:  Oral    SpO2: 94% 95%    Weight:   97.3 kg (214 lb 8.1 oz)   Height:        Intake/Output Summary (Last 24 hours) at 08/26/2017 1052 Last data filed at 08/26/2017 0948 Gross per 24 hour  Intake 178 ml  Output 1300 ml  Net -1122 ml   Filed Weights   08/24/17 0404 08/25/17 0430 08/26/17 0400    Weight: 100.1 kg (220 lb 10.9 oz) 98.1 kg (216 lb 4.3 oz) 97.3 kg (214 lb 8.1 oz)    Examination:  General exam: Appears calm and comfortable  Respiratory system: Lungs clear to auscultation bilaterally anterior lung fields.  No crackles, no rhonchi, no wheezing.   Cardiovascular system: RRR no murmurs rubs or gallops.  No JVD.  Trace to 1+ right lower extremity edema.  Gastrointestinal system: Abdomen is soft, positive bowel sounds.  No rebound.  No guarding.  Central nervous system: Alert and oriented. No focal neurological deficits. Extremities: Right lower extremity incision sites clean/dry/intact.   Skin: No rashes, lesions or ulcers Psychiatry: Judgement and insight appear normal. Mood & affect appropriate.     Data Reviewed: I have personally reviewed following labs and imaging studies  CBC: Recent Labs  Lab 08/23/17 0221 08/24/17 0251 08/25/17 0223 08/26/17 0308  WBC 6.2 6.7 6.9 6.2  NEUTROABS  --  4.4  --   --   HGB 11.0* 9.8* 9.9* 9.5*  HCT 35.0* 31.8* 32.8* 30.7*  MCV 98.9 98.8 100.3* 99.4  PLT 168 160 165 158   Basic Metabolic Panel: Recent Labs  Lab 08/21/17 0231 08/23/17 0221  08/24/17 0251 08/25/17 0223 08/26/17 0308  NA 137 135 135 135 136  K 4.3 4.2 4.8 4.0 3.8  CL 107 102 102 102 102  CO2 24 25 25 25 26   GLUCOSE 99 93 101* 114* 111*  BUN 22* 18 14 16 14   CREATININE 1.53* 1.52* 1.55* 1.67* 1.51*  CALCIUM 8.4* 8.5* 8.2* 7.9* 8.3*  PHOS 2.9  --  3.7  --   --    GFR: Estimated Creatinine Clearance: 50.3 mL/min (A) (by C-G formula based on SCr of 1.51 mg/dL (H)). Liver Function Tests: Recent Labs  Lab 08/21/17 0231 08/24/17 0251  ALBUMIN 2.2* 2.2*   No results for input(s): LIPASE, AMYLASE in the last 168 hours. No results for input(s): AMMONIA in the last 168 hours. Coagulation Profile: No results for input(s): INR, PROTIME in the last 168 hours. Cardiac Enzymes: No results for input(s): CKTOTAL, CKMB, CKMBINDEX, TROPONINI in the last 168  hours. BNP (last 3 results) No results for input(s): PROBNP in the last 8760 hours. HbA1C: No results for input(s): HGBA1C in the last 72 hours. CBG: Recent Labs  Lab 08/23/17 1534  GLUCAP 79   Lipid Profile: No results for input(s): CHOL, HDL, LDLCALC, TRIG, CHOLHDL, LDLDIRECT in the last 72 hours. Thyroid Function Tests: No results for input(s): TSH, T4TOTAL, FREET4, T3FREE, THYROIDAB in the last 72 hours. Anemia Panel: Recent Labs    08/25/17 0223  VITAMINB12 936*  FOLATE 51.3  FERRITIN 217  TIBC 181*  IRON 16*   Sepsis Labs: No results for input(s): PROCALCITON, LATICACIDVEN in the last 168 hours.  Recent Results (from the past 240 hour(s))  Surgical pcr screen     Status: None   Collection Time: 08/23/17  2:16 AM  Result Value Ref Range Status   MRSA, PCR NEGATIVE NEGATIVE Final   Staphylococcus aureus NEGATIVE NEGATIVE Final    Comment: (NOTE) The Xpert SA Assay (FDA approved for NASAL specimens in patients 67 years of age and older), is one component of a comprehensive surveillance program. It is not intended to diagnose infection nor to guide or monitor treatment. Performed at [facility], [address] [place], [place] [address]   Aerobic/Anaerobic Culture (surgical/deep wound)     Status: None (Preliminary result)   Collection Time: 08/23/17  2:23 PM  Result Value Ref Range Status   Specimen Description WOUND RIGHT TOE FOURTH ANS FIFITH TOE  Final   Special Requests SPE A ON SWABS POF ZOSYN  Final   Gram Stain   Final    FEW WBC PRESENT,BOTH PMN AND MONONUCLEAR NO ORGANISMS SEEN    Culture   Final    CULTURE REINCUBATED FOR BETTER GROWTH Performed at [facility], [address], [place], [place] [address]    Report Status PENDING  Incomplete  Aerobic/Anaerobic Culture (surgical/deep wound)     Status: None (Preliminary result)   Collection Time: 08/23/17  2:36 PM  Result Value Ref Range Status   Specimen Description TISSUE RIGHT TOE  FOURTH AND FIFTH TOE  Final   Special Requests SPEC B POF ZOSYN  Final   Gram Stain   Final    FEW WBC PRESENT,BOTH PMN AND MONONUCLEAR NO ORGANISMS SEEN    Culture   Final    CULTURE REINCUBATED FOR BETTER GROWTH Performed at [facility], [address] [place], [place] [address]    Report Status PENDING  Incomplete         Radiology Studies: No results found.  Scheduled Meds: . amLODipine  10 mg Oral Daily  . aspirin EC  81 mg Oral Daily  . atorvastatin  40 mg Oral q1800  . collagenase   Topical Daily  . DULoxetine  30 mg Oral Daily  . feeding supplement (ENSURE ENLIVE)  237 mL Oral BID BM  . ferrous sulfate  325 mg Oral TID WC  . folic acid  5 mg Oral Daily  . gabapentin  100 mg Oral TID  . heparin  5,000 Units Subcutaneous Q8H  . hydrochlorothiazide  12.5 mg Oral Daily  . hydrOXYzine  25 mg Oral TID  . mouth rinse  15 mL Mouth Rinse BID  . metoprolol succinate  25 mg Oral Daily  . pantoprazole  40 mg Oral Daily  . senna-docusate  1 tablet Oral BID  . sodium chloride flush  3 mL Intravenous Q12H  . triamcinolone cream   Topical BID  . vitamin B-12  1,000 mcg Oral Daily   Continuous Infusions: . sodium chloride    . sodium chloride    . magnesium sulfate 1 - 4 g bolus IVPB    . piperacillin-tazobactam (ZOSYN)  IV Stopped (08/26/17 0947)  . vancomycin Stopped (08/26/17 0015)     LOS: 11 days    Time spent: 35 minutes    Ramiro Harvest, MD [facility] Pager [phone]  If 7PM-7AM, please contact night-coverage [email or web address] Password [facility] 08/26/2017, 10:52 AM

## 2017-08-26 NOTE — Discharge Instructions (Signed)
 [facility]  Discharge instructions  Lower Extremity Bypass Surgery  Please refer to the following instruction for your post-procedure care. Your surgeon or physician assistant will discuss any changes with you.  Activity  You are encouraged to walk as much as you can. You can slowly return to normal activities during the month after your surgery. Avoid strenuous activity and heavy lifting until your doctor tells you it's OK. Avoid activities such as vacuuming or swinging a golf club. Do not drive until your doctor give the OK and you are no longer taking prescription pain medications. It is also normal to have difficulty with sleep habits, eating and bowel movement after surgery. These will go away with time.  Bathing/Showering  You may shower after you go home. Do not soak in a bathtub, hot tub, or swim until the incision heals completely.  Incision Care  Clean your incision with mild soap and water. Shower every day. Pat the area dry with a clean towel. You do not need a bandage unless otherwise instructed. Do not apply any ointments or creams to your incision. If you have open wounds you will be instructed how to care for them or a visiting nurse may be arranged for you. If you have staples or sutures along your incision they will be removed at your post-op appointment. You may have skin glue on your incision. Do not peel it off. It will come off on its own in about one week. If you have a great deal of moisture in your groin, use a gauze help keep this area dry.  Diet  Resume your normal diet. There are no special food restrictions following this procedure. A low fat/ low cholesterol diet is recommended for all patients with vascular disease. In order to heal from your surgery, it is CRITICAL to get adequate nutrition. Your body requires vitamins, minerals, and protein. Vegetables are the best source of vitamins and minerals. Vegetables also provide the  perfect balance of protein. Processed food has little nutritional value, so try to avoid this.  Medications  Resume taking all your medications unless your doctor or nurse practitioner tells you not to. If your incision is causing pain, you may take over-the-counter pain relievers such as acetaminophen (Tylenol). If you were prescribed a stronger pain medication, please aware these medication can cause nausea and constipation. Prevent nausea by taking the medication with a snack or meal. Avoid constipation by drinking plenty of fluids and eating foods with high amount of fiber, such as fruits, vegetables, and grains. Take Colase 100 mg (an over-the-counter stool softener) twice a day as needed for constipation. Do not take Tylenol if you are taking prescription pain medications.  Follow Up  Our office will schedule a follow up appointment 2-3 weeks following discharge.  Please call us immediately for any of the following conditions  Severe or worsening pain in your legs or feet while at rest or while walking Increase pain, redness, warmth, or drainage (pus) from your incision site(s) Fever of 101 degree or higher The swelling in your leg with the bypass suddenly worsens and becomes more painful than when you were in the hospital If you have been instructed to feel your graft pulse then you should do so every day. If you can no longer feel this pulse, call the office immediately. Not all patients are given this instruction.  Leg swelling is common after leg bypass surgery.  The swelling should improve over a few months   following surgery. To improve the swelling, you may elevate your legs above the level of your heart while you are sitting or resting. Your surgeon or physician assistant may ask you to apply an ACE wrap or wear compression (TED) stockings to help to reduce swelling.  Reduce your risk of vascular disease  Stop smoking. If you would like help call [facility] at 1-800-QUIT-NOW  (1-800-784-8669) or [place] at 336-586-4000.  Manage your cholesterol Maintain a desired weight Control your diabetes weight Control your diabetes Keep your blood pressure down  If you have any questions, please call the office at [phone]   

## 2017-08-26 NOTE — Discharge Summary (Addendum)
[facility] Discharge Summary   Patient ID:  [name]. MRN: [id number] DOB/AGE: 76-Oct-1942 76 y.o.  Admit date: 08/15/2017 Discharge date: 08/26/2017 Date of Surgery: 08/23/2017 Surgeon: Moishe Spice): [name], MD  Admission Diagnosis: Peripheral vascular disease (HCC) [I73.9] Ischemic foot [I99.8]  Discharge Diagnoses:  Peripheral vascular disease (HCC) [I73.9] Ischemic foot [I99.8]  Secondary Diagnoses: Past Medical History:  Diagnosis Date  . Carotid artery occlusion   . Colon polyps   . Degenerative arthritis of spine   . Diverticulosis   . Hemorrhoids   . Hemorrhoids   . Hyperlipidemia   . Hypertension    2010  . Ischemia of foot 08/2017  . Sciatica   . Solar keratosis   . 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  Discharged Condition: good  HPI: 77 y/o male presented with right foot pain and ischemic changes to the 4th toe.  Pre existing contributories include tobacco abuse and history of peripheral arterial disease.  S/P left CEA.  His initial ABI right 0.37 and left 0.81.   Angiogram was performed 08/18/2017.  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 He under went cardiac risk stratification for pre op planning.    Hospital Course:  Rollen Selders. is a 77 y.o. male is S/P  Procedure(s): BYPASS GRAFT ABOVE THE KNEE POPLITEAL TO BELOW THE KNEE POPLITEAL RIGHT USING PROPATEN GORE VASCULAR GRAFT X AMPUTATION FOURTH AND FIFTH TOES RIGHT FOOT, Debridement of Right Heal and third toe.  Brisk right DP/PT/peroneal doppler signals.  He was placed on a 10 day course of abx vanc and zosynas precaution given prosthetic graft utilized.  These were completed 08/26/2017.     Bradycardia Patient seen in  consultation by cardiology who have decreased patient's Toprol to 25 mg daily.  Norvasc has been increased to 10 mg daily.  Continue current increased dose of Lipitor at 40 mg daily.      Acute on chronic kidney disease stage III (baseline creatinine 1.3-1.6) On admission patient noted to have a creatinine as high as 2.52 with a BUN of 60.  Felt likely to be secondary to prerenal azotemia in the setting of ARB and diuretic.  ARB and diuretic have been discontinued.  Patient hydrated with IV fluids with improvement in renal function and currently close to baseline.  Follow.   Post operative ABI's were completed   Right 0.91 with TBI 0.73, Left 1.0 and TBI 0.5  Wound care post op Aquacel Ag to 4,5th toe amp and 3rd toe ulcer; santyl to heel.  Daily dressing changes right foot wounds.  Stable disposition for discharge to SNF.    Consults:  Treatment Team:  [name], MD Rhetta Mura, MD  Significant Diagnostic Studies: CBC Lab Results  Component Value Date   WBC 6.2 08/26/2017   HGB 9.5 (L) 08/26/2017   HCT 30.7 (L) 08/26/2017   MCV 99.4 08/26/2017   PLT 158 08/26/2017    BMET    Component Value Date/Time   NA 136 08/26/2017 0308   K 3.8 08/26/2017 0308   CL 102 08/26/2017 0308   CO2 26 08/26/2017 0308   GLUCOSE 111 (H) 08/26/2017 0308   BUN 14 08/26/2017 0308   CREATININE 1.51 (H) 08/26/2017 0308  CREATININE 1.38 (H) 08/09/2017 0948   CALCIUM 8.3 (L) 08/26/2017 0308   GFRNONAA 43 (L) 08/26/2017 0308   GFRNONAA 50 (L) 08/09/2017 0948   GFRAA 50 (L) 08/26/2017 0308   GFRAA 58 (L) 08/09/2017 0948   COAG Lab Results  Component Value Date   INR 0.91 09/17/2010   INR 0.90 09/02/2010     Disposition:  Discharge to :Skilled nursing facility  Allergies as of 08/26/2017      Reactions   Pollen Extract       Medication List    STOP taking these medications   ciprofloxacin 750 MG tablet Commonly known as:  CIPRO   metroNIDAZOLE 500 MG  tablet Commonly known as:  FLAGYL   valsartan-hydrochlorothiazide 320-25 MG tablet Commonly known as:  DIOVAN-HCT     TAKE these medications   AMBULATORY NON FORMULARY MEDICATION Knee-high, medium compression, graduated compression stockings. Apply to lower extremities. [employer], Zippered Compression Stockings, medium circ, long length   amLODipine 5 MG tablet Commonly known as:  NORVASC Take 1 tablet (5 mg total) by mouth daily.   azelastine 0.05 % ophthalmic solution Commonly known as:  OPTIVAR Place 2 drops into both eyes 2 (two) times daily.   DULoxetine 30 MG capsule Commonly known as:  CYMBALTA Take 1 capsule (30 mg total) by mouth daily.   ferrous sulfate 325 (65 FE) MG EC tablet Take 1 tablet (325 mg total) by mouth 3 (three) times daily with meals.   folic acid 1 MG tablet Commonly known as:  FOLVITE Take 5 tablets (5 mg total) by mouth daily.   gabapentin 100 MG capsule Commonly known as:  NEURONTIN Take 1 capsule (100 mg total) by mouth 3 (three) times daily for 10 days.   hydrochlorothiazide 12.5 MG capsule Commonly known as:  MICROZIDE Take 1 capsule (12.5 mg total) by mouth daily. Start taking on:  08/27/2017   HYDROcodone-acetaminophen 5-325 MG tablet Commonly known as:  NORCO/VICODIN Take 1 tablet by mouth every 8 (eight) hours as needed for moderate pain.   metoprolol succinate 25 MG 24 hr tablet Commonly known as:  TOPROL-XL Take 1 tablet (25 mg total) by mouth daily. What changed:    medication strength  how much to take  how to take this  when to take this  additional instructions   ondansetron 8 MG disintegrating tablet Commonly known as:  ZOFRAN-ODT Take 1 tablet (8 mg total) by mouth every 8 (eight) hours as needed for nausea.   oxyCODONE 5 MG immediate release tablet Commonly known as:  Oxy IR/ROXICODONE Take 1-2 tablets (5-10 mg total) by mouth every 4 (four) hours as needed for moderate pain.   promethazine 25 MG  tablet Commonly known as:  PHENERGAN Take 1 tablet (25 mg total) by mouth every 6 (six) hours as needed for nausea.   vitamin B-12 1000 MCG tablet Commonly known as:  CYANOCOBALAMIN Take 1 tablet (1,000 mcg total) by mouth daily.      Verbal and written Discharge instructions given to the patient. Wound care per Discharge AVS Follow-up Information    SNF Follow up.        Kathleene Hazel, MD. Schedule an appointment as soon as possible for a visit in 2 week(s).   Specialty:  Cardiology Contact information: [address] [place] [address] [phone]        [name], MD Follow up in 2 week(s).   Specialties:  Vascular Surgery, Cardiology Why:  office will call  for appt. Contact information: [address] [place] [address] [phone]           Signed: Mosetta Pigeonmma Maureen Collins 08/26/2017, 11:27 AM   Addendum  Reva Boresroy Villalpando Jr. is a 77 y.o. (09/21/1940) male presented to the hospitalist service with cellulitis surrounding gangrenous R 4th-5th toes.  He was admitted for IV antibiotics and medical optimization.  While admitted he underwent angiography which demonstrated chronic right popliteal artery occlusion.  Risk stratification with Cardiology was obtained and patient was cleared for surgery.  He underwent a right above-the-knee popliteal artery to below-the-knee popliteal artery bypass.  While the right greater saphenous vein was harvested, it was non-distendible and felt inadequate for bypass.  Instead a short segment of Propaten was utilized.  The right 4th and 5th toes were amputated as was partial 4th and 5th metatarsal head resect completed to accommodate the skin flaps.  I debrided some necrotic tissue off the 3rd toe and the medial heel.  The patient underwent routine wound care as part his post-operative regimen.  PT/OT felt the patient would need rehab in a SNF setting.  He was transfer to SNF to continue his rehab and wound care.  He will  come back to the office in 2 weeks to check his wounds.  Leonides SakeBrian Sorrel Cassetta, MD, FACS [facility] [place] Office: [phone] Pager: [phone]  08/30/2017, 7:51 AM

## 2017-08-26 NOTE — Progress Notes (Signed)
08/26/2017 6:35 PM Pt [facility]. Kathryne HitchAllen, Lorissa Kishbaugh C

## 2017-08-26 NOTE — Plan of Care (Signed)
  Problem: Health Behavior/Discharge Planning: Goal: Ability to manage health-related needs will improve Outcome: Progressing   Problem: Clinical Measurements: Goal: Ability to maintain clinical measurements within normal limits will improve Outcome: Progressing   Problem: Clinical Measurements: Goal: Will remain free from infection Outcome: Progressing   Problem: Clinical Measurements: Goal: Diagnostic test results will improve Outcome: Progressing   Problem: Clinical Measurements: Goal: Respiratory complications will improve Outcome: Progressing   Problem: Clinical Measurements: Goal: Cardiovascular complication will be avoided Outcome: Progressing   Problem: Education: Goal: Knowledge of prescribed regimen will improve Outcome: Progressing   Problem: Activity: Goal: Risk for activity intolerance will decrease Outcome: Progressing   Problem: Clinical Measurements: Goal: Postoperative complications will be avoided or minimized Outcome: Progressing   Problem: Clinical Measurements: Goal: Signs and symptoms of graft occlusion will improve Outcome: Progressing   Problem: Skin Integrity: Goal: Demonstration of wound healing without infection will improve Outcome: Progressing   Problem: Education: Goal: Knowledge of the prescribed therapeutic regimen will improve Outcome: Progressing   Problem: Education: Goal: Ability to verbalize activity precautions or restrictions will improve Outcome: Progressing   Problem: Education: Goal: Understanding of discharge needs will improve Outcome: Progressing

## 2017-08-26 NOTE — Progress Notes (Signed)
Clinical Social Worker facilitated patient discharge including contacting patient family and facility to confirm patient discharge plans.  Clinical information faxed to facility and family agreeable with plan.  CSW arranged ambulance transport via [facility] .  RN to call [phone] for report prior to discharge.  Clinical Social Worker will sign off for now as social work intervention is no longer needed. Please consult us again if new need arises.  Marrianne MoodAshley Tashya Alberty, MSW, [employer] [phone]

## 2017-08-26 NOTE — Progress Notes (Signed)
   Daily Progress Note   Assessment/Planning:   POD #3 s/p R ATK to BTK pop bypass with Propaten, R 4-5th toe amputation, R heel debridement   PT/OT recommend SNF: will start placement process  Too early to say whether the R 3rd toe will survive.  R heel is cleaning up with Santyl.  Cont with wound care  ABI pending   Subjective  - 3 Days Post-Op   No complaints, getting wound care BID   Objective   Vitals:   08/25/17 2357 08/26/17 0000 08/26/17 0354 08/26/17 0400  BP: (!) 133/46 (!) 133/46 126/70   Pulse: 60 (!) 51 (!) 56   Resp: 16 12 15    Temp: 98.4 F (36.9 C)  97.8 F (36.6 C)   TempSrc: Oral  Oral   SpO2: 96% 94% 95%   Weight:    214 lb 8.1 oz (97.3 kg)  Height:         Intake/Output Summary (Last 24 hours) at 08/26/2017 0815 Last data filed at 08/26/2017 0403 Gross per 24 hour  Intake 168 ml  Output 1300 ml  Net -1132 ml    PULM  CTAB  CV  RRR  GI  soft, NTND  [employer] c/d/i, no hematoma, dopplerable signals  NEURO DNVI    Laboratory   CBC CBC Latest Ref Rng & Units 08/26/2017 08/25/2017 08/24/2017  WBC 4.0 - 10.5 K/uL 6.2 6.9 6.7  Hemoglobin 13.0 - 17.0 g/dL 1.6(X9.5(L) 0.9(U9.9(L) 0.4(V9.8(L)  Hematocrit 39.0 - 52.0 % 30.7(L) 32.8(L) 31.8(L)  Platelets 150 - 400 K/uL 158 165 160    BMET    Component Value Date/Time   NA 136 08/26/2017 0308   K 3.8 08/26/2017 0308   CL 102 08/26/2017 0308   CO2 26 08/26/2017 0308   GLUCOSE 111 (H) 08/26/2017 0308   BUN 14 08/26/2017 0308   CREATININE 1.51 (H) 08/26/2017 0308   CREATININE 1.38 (H) 08/09/2017 0948   CALCIUM 8.3 (L) 08/26/2017 0308   GFRNONAA 43 (L) 08/26/2017 0308   GFRNONAA 50 (L) 08/09/2017 0948   GFRAA 50 (L) 08/26/2017 0308   GFRAA 58 (L) 08/09/2017 [id number]     Leonides SakeBrian Gertrude Bucks, MD, FACS [facility] [place] Office: [phone] Pager: [phone]  08/26/2017, 8:15 AM

## 2017-08-28 LAB — AEROBIC/ANAEROBIC CULTURE W GRAM STAIN (SURGICAL/DEEP WOUND)

## 2017-08-28 LAB — AEROBIC/ANAEROBIC CULTURE (SURGICAL/DEEP WOUND)

## 2017-08-29 ENCOUNTER — Encounter: Payer: Medicare Other | Admitting: Vascular Surgery

## 2017-08-30 ENCOUNTER — Telehealth: Payer: Self-pay | Admitting: Vascular Surgery

## 2017-08-30 NOTE — Telephone Encounter (Signed)
Left pt vm for 10/05/17 appt. Mailed letter.

## 2017-08-30 NOTE — Telephone Encounter (Signed)
-----   Message from Sharee PimpleMarilyn K McChesney, RN sent at 08/26/2017  4:49 PM EDT ----- Regarding: 2-3 weeks , going to SNF, so call them   ----- Message ----- From: Lars Mageollins, Emma M, PA-C Sent: 08/26/2017  11:26 AM To: Vvs Charge Pool  Patient is being discharged to SNF.  S/P right LE bypass.  F/U with DR. Chen in 2-3 weeks.  No labs needed.

## 2017-09-05 ENCOUNTER — Telehealth: Payer: Self-pay

## 2017-09-05 NOTE — Telephone Encounter (Signed)
I think if its related to his surgery this would be a better question directed to his surgeon.  I would be in support of home health nursing and wound care.

## 2017-09-05 NOTE — Telephone Encounter (Signed)
[name] called to ask if Dr Benjamin Stainhekkekandam was going to order Home Health care post surgery. Please advise.

## 2017-09-06 NOTE — Telephone Encounter (Signed)
Patient advised of recommendations.  

## 2017-09-21 ENCOUNTER — Ambulatory Visit (INDEPENDENT_AMBULATORY_CARE_PROVIDER_SITE_OTHER): Payer: Self-pay | Admitting: Vascular Surgery

## 2017-09-21 ENCOUNTER — Other Ambulatory Visit: Payer: Self-pay

## 2017-09-21 ENCOUNTER — Encounter: Payer: Self-pay | Admitting: Vascular Surgery

## 2017-09-21 DIAGNOSIS — I70261 Atherosclerosis of native arteries of extremities with gangrene, right leg: Secondary | ICD-10-CM

## 2017-09-21 DIAGNOSIS — I70269 Atherosclerosis of native arteries of extremities with gangrene, unspecified extremity: Secondary | ICD-10-CM | POA: Insufficient documentation

## 2017-09-21 MED ORDER — OXYCODONE-ACETAMINOPHEN 5-325 MG PO TABS: 1.0000 | ORAL_TABLET | ORAL | 0 refills | Status: DC | PRN

## 2017-09-21 NOTE — Progress Notes (Signed)
    Postoperative Visit   History of Present Illness   Brandon Boresroy Fotheringham Jr. is a 77 y.o. year old male who presents for postoperative follow-up for: R AK to BK pop BPG w/ Propaten (08/23/17).  The patient's wounds are healing.  The patient notes resolution of lower extremity symptoms.  The patient is able to complete their activities of daily living.  The patient's current symptoms are: swelling in vein harvest site and healing residual heel ulcer.   For VQI Use Only   PRE-ADM LIVING: Home  AMB STATUS: Ambulatory with Assistance   Physical Examination   Vitals:   09/21/17 1516 09/21/17 1525  BP: (!) 154/90 (!) 164/90  Pulse: 65 65  Resp: 18   Temp: 98.5 F (36.9 C)   TempSrc: Oral   SpO2: 100%   Weight: 205 lb (93 kg)   Height: 5\' 11"  (1.803 m)     RLE: AK and BK pop incision healed, +tense fluid in periknee vein harvest site, R 4th and 5th toe amputations near healed, R heel granulating with shallow ulcer, 3rd toe ulcer appears healed, warm R foot with 1-2+ edema   Medical Decision Making   Brandon Boresroy Landen Jr. is a 77 y.o. year old male who presents s/p R AK to BK pop BPG w/ Proptaten, R 4th and 5th ray amputation, debridement of R medial heel ulcer and R 3rd toe ulcer.   R ray amputation site looks good and is healing appropriately.    Heel ulcer is reportedly healing with Santyl ointment and wet-to-dry dressings.  Would watch the vein harvest site.  If it drains spontaneous, may need formal I&D and VAC placement for a seroma. Follow up in one month for wounds check. Thank you for allowing us to participate in this patient's care.   Leonides SakeBrian Arbie Blankley, MD, FACS [facility] [place] Office: [phone] Pager: [phone]

## 2017-09-27 ENCOUNTER — Telehealth: Payer: Self-pay

## 2017-09-27 DIAGNOSIS — I10 Essential (primary) hypertension: Secondary | ICD-10-CM

## 2017-09-27 MED ORDER — AMLODIPINE BESYLATE 5 MG PO TABS: 5.0000 mg | ORAL_TABLET | Freq: Every day | ORAL | 1 refills | Status: DC

## 2017-09-27 NOTE — Telephone Encounter (Signed)
With his aute on chronic renal insufficiency I think we should hold off on it for now, in the outpatient setting when he comes for his hospital follow-up we will determine based on his blood pressures if we need to restart this.

## 2017-09-27 NOTE — Telephone Encounter (Signed)
Left message advising of recommendations and for a call back.

## 2017-09-27 NOTE — Telephone Encounter (Signed)
[name] called for a refill on valsartan. This medication is no longer on current medication list. I told him it was removed on 08/26/17 to stop at discharge. He states he was not told to stop this medication. Please advise.

## 2017-10-04 NOTE — Telephone Encounter (Signed)
Left message advising patient to call back to schedule a hospital follow up.

## 2017-10-12 ENCOUNTER — Ambulatory Visit (INDEPENDENT_AMBULATORY_CARE_PROVIDER_SITE_OTHER): Payer: Medicare Other | Admitting: Sports Medicine

## 2017-10-12 ENCOUNTER — Encounter: Payer: Self-pay | Admitting: Sports Medicine

## 2017-10-12 DIAGNOSIS — I10 Essential (primary) hypertension: Secondary | ICD-10-CM

## 2017-10-12 DIAGNOSIS — L603 Nail dystrophy: Secondary | ICD-10-CM | POA: Insufficient documentation

## 2017-10-12 DIAGNOSIS — I96 Gangrene, not elsewhere classified: Secondary | ICD-10-CM | POA: Diagnosis not present

## 2017-10-12 DIAGNOSIS — L7634 Postprocedural seroma of skin and subcutaneous tissue following other procedure: Secondary | ICD-10-CM | POA: Diagnosis not present

## 2017-10-12 LAB — CBC
HCT: 42.6 % (ref 38.5–50.0)
Hemoglobin: 14.4 g/dL (ref 13.2–17.1)
MCH: 32.1 pg (ref 27.0–33.0)
MCHC: 33.8 g/dL (ref 32.0–36.0)
MCV: 94.9 fL (ref 80.0–100.0)
MPV: 10.3 fL (ref 7.5–12.5)
Platelets: 175 Thousand/uL (ref 140–400)
RBC: 4.49 10*6/uL (ref 4.20–5.80)
RDW: 14.4 % (ref 11.0–15.0)
WBC: 7.2 10*3/uL (ref 3.8–10.8)

## 2017-10-12 LAB — COMPREHENSIVE METABOLIC PANEL
AG Ratio: 1.1 (calc) (ref 1.0–2.5)
ALT: 11 U/L (ref 9–46)
Albumin: 3.9 g/dL (ref 3.6–5.1)
BUN/Creatinine Ratio: 13 (calc) (ref 6–22)
CO2: 28 mmol/L (ref 20–32)
Calcium: 9.5 mg/dL (ref 8.6–10.3)
Chloride: 102 mmol/L (ref 98–110)
Globulin: 3.4 g/dL (calc) (ref 1.9–3.7)
Glucose, Bld: 93 mg/dL (ref 65–99)
Potassium: 5 mmol/L (ref 3.5–5.3)
Sodium: 138 mmol/L (ref 135–146)
Total Bilirubin: 0.6 mg/dL (ref 0.2–1.2)
Total Protein: 7.3 g/dL (ref 6.1–8.1)

## 2017-10-12 LAB — COMPREHENSIVE METABOLIC PANEL WITH GFR
AST: 17 U/L (ref 10–35)
Alkaline phosphatase (APISO): 69 U/L (ref 40–115)
BUN: 20 mg/dL (ref 7–25)
Creat: 1.53 mg/dL — ABNORMAL HIGH (ref 0.70–1.18)

## 2017-10-12 MED ORDER — VALSARTAN-HYDROCHLOROTHIAZIDE 160-12.5 MG PO TABS: 1.0000 | ORAL_TABLET | Freq: Every day | ORAL | 3 refills | Status: DC

## 2017-10-12 MED ORDER — HYDROXYZINE HCL 50 MG PO TABS: 50.0000 mg | ORAL_TABLET | Freq: Three times a day (TID) | ORAL | 3 refills | Status: DC | PRN

## 2017-10-12 NOTE — Assessment & Plan Note (Signed)
Ultrasound-guided aspiration of seroma, clear, yellowish fluid, no signs of purulence.  15 mL out.

## 2017-10-12 NOTE — Assessment & Plan Note (Signed)
Status post right fourth and fifth toe amputations. I debrided the wound which is healing well, applied DuoDERM. He also has a wound on the posterior heel, this was aggressively debrided and do a Derm applied as well. Strapped with compressive dressing.

## 2017-10-12 NOTE — Progress Notes (Signed)
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  Overall he has done well, is working with wound care.  He did develop a postoperative seroma behind the right knee.  Has very little pain  Hypertension: Uncontrolled, medicines were modified during hospitalization, no headaches, visual changes, chest pain.  Itching: Fairly diffuse, off of antibiotics, no yellowing of the eyes.  I reviewed the past medical history, family history, social history, surgical history, and allergies today and no changes were needed.  Please see the problem list section below in epic for further details.  Past Medical History: Past Medical History:  Diagnosis Date  . Carotid artery occlusion   . Colon polyps   . Degenerative arthritis of spine   . Diverticulosis   . Hemorrhoids   . Hemorrhoids   . Hyperlipidemia   . Hypertension    2010  . Ischemia of foot 08/2017  . Sciatica   . Solar keratosis   . Vitamin B12 deficiency    Past Surgical History: Past Surgical History:  Procedure Laterality Date  . ABDOMINAL AORTOGRAM W/LOWER EXTREMITY N/A 08/18/2017   Procedure: ABDOMINAL AORTOGRAM W/LOWER EXTREMITY;  Surgeon: [name], MD;  [facility] INVASIVE CV LAB;  Service: Cardiovascular;  Laterality: N/A;  rt .lower extermity  . AMPUTATION Right 08/23/2017   Procedure: AMPUTATION FOURTH AND FIFTH TOES RIGHT FOOT, Debridement of Right Heal and third toe.;  Surgeon: [name], MD;  Location: [facility] OR;  Service: Vascular;  Laterality: Right;  . BYPASS GRAFT POPLITEAL TO POPLITEAL Right 08/23/2017   Procedure: BYPASS GRAFT ABOVE THE KNEE POPLITEAL TO BELOW THE KNEE POPLITEAL RIGHT USING PROPATEN GORE VASCULAR GRAFT X ;  Surgeon: [name], MD;  Location: [facility] OR;  Service: Vascular;  Laterality: Right;  .  CAROTID ENDARTERECTOMY  09/21/10   Right CEA  . NECK MASS EXCISION     Social History: Social History   Socioeconomic History  . Marital status: Widowed    Spouse name: Not on file  . Number of children: 2  . Years of education: HS  . Highest education level: Not on file  Occupational History  . Occupation: retired  Engineer, production  . Financial resource strain: Not on file  . Food insecurity:    Worry: Not on file    Inability: Not on file  . Transportation needs:    Medical: Not on file    Non-medical: Not on file  Tobacco Use  . Smoking status: Current Every Day Smoker    Packs/day: 1.00    Years: 50.00    Pack years: 50.00    Types: Cigarettes  . Smokeless tobacco: Never Used  Substance and Sexual Activity  . Alcohol use: Yes    Alcohol/week: 0.0 oz    Comment: Patient drinks 3 beers daily  . Drug use: No  . Sexual activity: Not on file  Lifestyle  . Physical activity:    Days per week: Not on file    Minutes per session: Not on file  . Stress: Not on file  Relationships  . Social connections:    Talks on phone: Not on file    Gets together: Not on file    Attends religious service: Not on file    Active member of club or organization: Not on file  Attends meetings of clubs or organizations: Not on file    Relationship status: Not on file  Other Topics Concern  . Not on file  Social History Narrative   Patient drinks about 2 cups of coffee daily.   Patient is right handed.   Family History: Family History  Problem Relation Age of Onset  . Dementia Mother   . Heart attack Father        First MI age 22.  Marland Kitchen Heart disease Father 65  . Heart attack Brother        CAD, prior MI  . Hypertension Brother   . ALS Sister    Allergies: Allergies  Allergen Reactions  . Pollen Extract    Medications: See med rec.  Review of Systems: No fevers, chills, night sweats, weight loss, chest pain, or shortness of breath.   Objective:    General: Well  Developed, well nourished, and in no acute distress.  Neuro: Alert and oriented x3, extra-ocular muscles intact, sensation grossly intact.  HEENT: Normocephalic, atraumatic, pupils equal round reactive to light, neck supple, no masses, no lymphadenopathy, thyroid nonpalpable.  Skin: Warm and dry, no rashes. Cardiac: Regular rate and rhythm, no murmurs rubs or gallops, no lower extremity edema.  Respiratory: Clear to auscultation bilaterally. Not using accessory muscles, speaking in full sentences. Right foot: Partially healed incision with some persistent, 1.5 cm open area with a bit of yellowish eschar.  This was aggressively debrided down to bleeding tissue.  I also debrided a small centimeter ulcer on the posterior heel.  These were irrigated with sterile saline with a bit of Betadine and dressed with DuoDERM.  The foot was then strapped with a compressive dressing.  He does have onychodystrophy predominantly of the great toenail. Right knee: Palpable seroma on the posterior medial aspect of the knee.  Procedure: Real-time Ultrasound Guided aspiration of right knee postoperative seroma Device: GE Logiq E  Verbal informed consent obtained.  Time-out conducted.  Noted no overlying erythema, induration, or other signs of local infection.  Skin prepped in a sterile fashion.  Local anesthesia: Topical Ethyl chloride.  With sterile technique and under real time ultrasound guidance: Using an 18-gauge needle identified the seroma, advanced an 18-gauge needle and aspirated approximately 15 mL of clear, straw-colored fluid. Completed without difficulty  Pain immediately resolved suggesting accurate placement of the medication.  Advised to call if fevers/chills, erythema, induration, drainage, or persistent bleeding.  Images permanently stored and available for review in the ultrasound unit.  Impression: Technically successful ultrasound guided aspiration.  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 I debrided the wound which is healing well, applied DuoDERM. He also has a wound on the posterior heel, this was aggressively debrided and do a Derm applied as well. Strapped with compressive dressing.  Postoperative seroma of subcutaneous tissue after non-dermatologic procedure Ultrasound-guided aspiration of seroma, clear, yellowish fluid, no signs of purulence.  15 mL out.   Essential hypertension, benign Increasing amlodipine to 10 mg, restarting valsartan at 160, switching to Diovan 160/12.5. Return in 2 weeks for a blood pressure check. Rechecking renal function.  Onychodystrophy Return on Friday morning for removal of right great toenail.  I spent 40 minutes with this patient, greater than 50% was face-to-face time counseling regarding the above diagnoses, this was separate from the time spent performing the above procedures ___________________________________________ Ihor Austin. Benjamin Stain, M.D., ABFM., CAQSM. Primary Care and Sports [facility]  [place] Instructor of [place] [facility] of Medicine

## 2017-10-12 NOTE — Assessment & Plan Note (Signed)
Return on Friday morning for removal of right great toenail.

## 2017-10-12 NOTE — Assessment & Plan Note (Signed)
Increasing amlodipine to 10 mg, restarting valsartan at 160, switching to Diovan 160/12.5. Return in 2 weeks for a blood pressure check. Rechecking renal function.

## 2017-10-14 ENCOUNTER — Ambulatory Visit (INDEPENDENT_AMBULATORY_CARE_PROVIDER_SITE_OTHER): Payer: Medicare Other | Admitting: Sports Medicine

## 2017-10-14 DIAGNOSIS — L603 Nail dystrophy: Secondary | ICD-10-CM

## 2017-10-14 NOTE — Progress Notes (Signed)
  Procedure:  Removal of right great toenail. Risks, benefits, alternatives explained to patient. Consent obtained. Time out conducted. Noted no overlying induration or erythema at site of injection. Toe cleaned with alcohol, then a total of 10 cc lidocaine 2% infiltrated at adjacent webspaces at the location of the bifurcation of the common digital nerve to proper digital nerves.  Some lidocaine also infiltrated at hyponychium and under nail bed.  Adequate anesthesia ensured. Toe prepped and draped in a sterile fashion. Nail elevator used to separate nail plate from nail bed. Hemostat then used to separate nail fragment from surrounding structures. Nail bed and matrix treated. Minor bleeding controlled with pressure and phenol. Antibiotic ointment applied. Toe dressed. Advised to return if increased redness, swelling, drainage, fevers, or chills. 

## 2017-10-14 NOTE — Assessment & Plan Note (Signed)
Right great toenail removal with phenol. Return in 2 weeks for a wound check.

## 2017-10-14 NOTE — Patient Instructions (Signed)
Fingernail or Toenail Removal, Adult, Care After  This sheet gives you information about how to care for yourself after your procedure. Your health care provider may also give you more specific instructions. If you have problems or questions, contact your health care provider.  What can I expect after the procedure?  After the procedure, it is common to have:  · Pain.  · Redness.  · Swelling.  · Soreness.    Follow these instructions at home:  · If you have a splint:  ? Do not put pressure on any part of the splint until it is fully hardened. This may take several hours.  ? Wear the splint as told by your health care provider. Remove it only as told by your health care provider.  ? Loosen the splint if your fingers or toes tingle, become numb, or turn cold and blue.  ? Keep the splint clean.  ? If the splint is not waterproof:  § Do not let it get wet.  § Cover it with a watertight covering when you take a bath or a shower.  Wound care    · Follow instructions from your health care provider about how to take care of your wound. Make sure you:  ? Wash your hands with soap and water before you change your bandage (dressing). If soap and water are not available, use hand sanitizer.  ? Change your dressing as told by your health care provider.  ? Keep your dressing dry until your health care provider says it can be removed.  ? Leave stitches (sutures), skin glue, or adhesive strips in place. These skin closures may need to stay in place for 2 weeks or longer. If adhesive strip edges start to loosen and curl up, you may trim the loose edges. Do not remove adhesive strips completely unless your health care provider tells you to do that.  · Check your wound every day for signs of infection. Check for:  ? More redness, swelling, or pain.  ? More fluid or blood.  ? Warmth.  ? Pus or a bad smell.  Managing pain, stiffness, and swelling  · Move your fingers or toes often to avoid stiffness and to lessen swelling.  · Raise  (elevate) the injured area above the level of your heart while you are sitting or lying down. You may need to keep your finger or toe raised or supported on a pillow for 24 hours or as told by your health care provider.  · Soak your hand or foot in warm, soapy water for 10-20 minutes, 3 times a day or as told by your health care provider.  Medicine  · Take over-the-counter and prescription medicines only as told by your health care provider.  · If you were prescribed an antibiotic medicine, use it as told by your health care provider. Do not stop using the antibiotic even if your condition improves.  General instructions  · If you were given a shoe to wear, wear it as told by your health care provider.  · Keep all follow-up visits as told by your health care provider. This is important.  Contact a health care provider if:  · You have more redness, swelling, or pain around your wound.  · You have more fluid or blood coming from your wound.  · Your wound feels warm to the touch.  · You have pus or a bad smell coming from your wound.  · You have a fever.  · Your   finger or toe looks blue or black.  This information is not intended to replace advice given to you by your health care provider. Make sure you discuss any questions you have with your health care provider.  Document Released: 06/14/2014 Document Revised: 01/21/2016 Document Reviewed: 12/01/2015  Elsevier Interactive Patient Education © 2018 Elsevier Inc.

## 2017-10-17 NOTE — Progress Notes (Signed)
    Postoperative Visit   History of Present Illness   [name]. is a 77 y.o. year old male who presents for postoperative follow-up for: R AK to BK pop BPG, ray amp R 4th and 5th toes, debridement of R 3rd toe ulcer (08/23/17).  The patient's wounds are not healed.  The patient notes resolution of lower extremity symptoms.  The patient is able to complete their activities of daily living.  The patient's current symptoms are: drainage from amp site and heel site.  Dr. Benjamin Stain aspirated 15 mL out the seroma on 10/12/17.  R great toenail was removed also on 10/14/17.   For VQI Use Only   PRE-ADM LIVING: Home  AMB STATUS: Ambulatory with Assistance   Physical Examination   Vitals:   10/19/17 1558  BP: (!) 141/89  Pulse: 83  Resp: 18  Temp: 98 F (36.7 C)  TempSrc: Oral  SpO2: 98%  Weight: 201 lb (91.2 kg)  Height:  (1.803 m)    RLE: AK and BK pop incision are healed, some fullness in vein harvest site,  R foot: great toe bandaged, R 4th and 5th ray amp site with some superficial wound breakdown, +granulation evident, medial heal ulcer: 1.5 cm in diameter with 1 cm deep, no granulation, palpable DP  Medical Decision Making   [name]. is a 77 y.o. year old male who presents s/p R AK pop to BK pop BPG w/ Propaten, R 4th and 5th amp, debridement of R 3rd toe ulcer  Likely seroma in vein harvest site: would manage conservatively, if worsens or become sx would open and VAC Continue wound care to R foot: wet-to-dry dressings daily Follow up in 2 weeks Thank you for allowing [place] to participate in this patient's care.   Leonides Sake, MD, FACS [facility] [place] Office: [phone] Pager: [phone]

## 2017-10-19 ENCOUNTER — Encounter: Payer: Self-pay | Admitting: Vascular Surgery

## 2017-10-19 ENCOUNTER — Ambulatory Visit (INDEPENDENT_AMBULATORY_CARE_PROVIDER_SITE_OTHER): Payer: Medicare Other | Admitting: Vascular Surgery

## 2017-10-19 ENCOUNTER — Other Ambulatory Visit: Payer: Self-pay

## 2017-10-19 VITALS — BP 141/89 | HR 83 | Temp 98.0°F | Resp 18 | Ht 71.0 in | Wt 201.0 lb

## 2017-10-19 DIAGNOSIS — I70261 Atherosclerosis of native arteries of extremities with gangrene, right leg: Secondary | ICD-10-CM

## 2017-10-20 ENCOUNTER — Telehealth: Payer: Self-pay

## 2017-10-20 NOTE — Telephone Encounter (Signed)
OK to give a verbal order for that.  Daily dressing changes with nonstick dressing and antibiotic ointment.

## 2017-10-21 NOTE — Telephone Encounter (Signed)
Done..thanks..they stated that this is not a skilled service that they can take full responsibility for but they will show him how to do it. W.Ermelinda Eckert, CCMA

## 2017-10-28 ENCOUNTER — Encounter: Payer: Self-pay | Admitting: Sports Medicine

## 2017-10-28 ENCOUNTER — Ambulatory Visit (INDEPENDENT_AMBULATORY_CARE_PROVIDER_SITE_OTHER): Payer: Medicare Other | Admitting: Sports Medicine

## 2017-10-28 DIAGNOSIS — L603 Nail dystrophy: Secondary | ICD-10-CM | POA: Diagnosis not present

## 2017-10-28 DIAGNOSIS — L299 Pruritus, unspecified: Secondary | ICD-10-CM

## 2017-10-28 DIAGNOSIS — I10 Essential (primary) hypertension: Secondary | ICD-10-CM | POA: Diagnosis not present

## 2017-10-28 MED ORDER — HYDROXYZINE HCL 50 MG PO TABS: 50.0000 mg | ORAL_TABLET | Freq: Three times a day (TID) | ORAL | 3 refills | Status: DC | PRN

## 2017-10-28 MED ORDER — GABAPENTIN 100 MG PO CAPS: 100.0000 mg | ORAL_CAPSULE | Freq: Two times a day (BID) | ORAL | 0 refills | Status: DC | PRN

## 2017-10-28 NOTE — Assessment & Plan Note (Signed)
2 weeks post right great toenail excision with phenol, doing well. Overall healing, debrided some devitalized tissue and dressed again.

## 2017-10-28 NOTE — Assessment & Plan Note (Signed)
Diffuse white spread itching, unable to use steroids due to slowly healing wounds from toe amputation and heel pressure wound. Continue hydroxyzine, adding a bit of gabapentin as well. Normal bilirubin levels.

## 2017-10-28 NOTE — Progress Notes (Signed)
Subjective:    CC: Follow-up  HPI: This is a pleasant 77 year old male post right fourth and fifth digit amputation on his foot, we removed his right great toenail 2 weeks ago, doing well.  He still has some significant itching.  I reviewed the past medical history, family history, social history, surgical history, and allergies today and no changes were needed.  Please see the problem list section below in epic for further details.  Past Medical History: Past Medical History:  Diagnosis Date  . Carotid artery occlusion   . Colon polyps   . Degenerative arthritis of spine   . Diverticulosis   . Hemorrhoids   . Hemorrhoids   . Hyperlipidemia   . Hypertension    2010  . Ischemia of foot 08/2017  . Sciatica   . Solar keratosis   . Vitamin B12 deficiency    Past Surgical History: Past Surgical History:  Procedure Laterality Date  . ABDOMINAL AORTOGRAM W/LOWER EXTREMITY N/A 08/18/2017   Procedure: ABDOMINAL AORTOGRAM W/LOWER EXTREMITY;  Surgeon: [name], MD;  Location: [facility] INVASIVE CV LAB;  Service: Cardiovascular;  Laterality: N/A;  rt .lower extermity  . AMPUTATION Right 08/23/2017   Procedure: AMPUTATION FOURTH AND FIFTH TOES RIGHT FOOT, Debridement of Right Heal and third toe.;  Surgeon: [name], MD;  Location: [facility] OR;  Service: Vascular;  Laterality: Right;  . BYPASS GRAFT POPLITEAL TO POPLITEAL Right 08/23/2017   Procedure: BYPASS GRAFT ABOVE THE KNEE POPLITEAL TO BELOW THE KNEE POPLITEAL RIGHT USING PROPATEN GORE VASCULAR GRAFT X ;  Surgeon: [name], MD;  Location: [facility] OR;  Service: Vascular;  Laterality: Right;  . CAROTID ENDARTERECTOMY  09/21/10   Right CEA  . NECK MASS EXCISION     Social History: Social History   Socioeconomic History  . Marital status: Widowed    Spouse name: Not on file  . Number of children: 2  . Years of education: HS  . Highest education level: Not on file  Occupational History  . Occupation: retired  Engineer, production  .  Financial resource strain: Not on file  . Food insecurity:    Worry: Not on file    Inability: Not on file  . Transportation needs:    Medical: Not on file    Non-medical: Not on file  Tobacco Use  . Smoking status: Current Every Day Smoker    Packs/day: 1.00    Years: 50.00    Pack years: 50.00    Types: Cigarettes  . Smokeless tobacco: Never Used  Substance and Sexual Activity  . Alcohol use: Yes    Alcohol/week: 0.0 oz    Comment: Patient drinks 3 beers daily  . Drug use: No  . Sexual activity: Not on file  Lifestyle  . Physical activity:    Days per week: Not on file    Minutes per session: Not on file  . Stress: Not on file  Relationships  . Social connections:    Talks on phone: Not on file    Gets together: Not on file    Attends religious service: Not on file    Active member of club or organization: Not on file    Attends meetings of clubs or organizations: Not on file    Relationship status: Not on file  Other Topics Concern  . Not on file  Social History Narrative   Patient drinks about 2 cups of coffee daily.   Patient is right handed.   Family History:  Family History  Problem Relation Age of Onset  . Dementia Mother   . Heart attack Father        First MI age 33.  Marland Kitchen Heart disease Father 16  . Heart attack Brother        CAD, prior MI  . Hypertension Brother   . ALS Sister    Allergies: Allergies  Allergen Reactions  . Pollen Extract    Medications: See med rec.  Review of Systems: No fevers, chills, night sweats, weight loss, chest pain, or shortness of breath.   Objective:    General: Well Developed, well nourished, and in no acute distress.  Neuro: Alert and oriented x3, extra-ocular muscles intact, sensation grossly intact.  HEENT: Normocephalic, atraumatic, pupils equal round reactive to light, neck supple, no masses, no lymphadenopathy, thyroid nonpalpable.  Skin: Warm and dry, no rashes. Cardiac: Regular rate and rhythm, no murmurs  rubs or gallops, no lower extremity edema.  Respiratory: Clear to auscultation bilaterally. Not using accessory muscles, speaking in full sentences. Right foot: Heel skin breakdown is down to a square centimeter, looks very good, no surrounding erythema.  Great toenail shows some granulation tissue over the nailbed.  There is a bit of devitalized tissue that was debrided today, dressed with antibiotic ointment and a Band-Aid.  The entire right lower extremity was strapped with a compressive dressing as well.  Impression and Recommendations:    Onychodystrophy 2 weeks post right great toenail excision with phenol, doing well. Overall healing, debrided some devitalized tissue and dressed again.   Itching Diffuse white spread itching, unable to use steroids due to slowly healing wounds from toe amputation and heel pressure wound. Continue hydroxyzine, adding a bit of gabapentin as well. Normal bilirubin levels.  ___________________________________________ Ihor Austin. Benjamin Stain, M.D., ABFM., CAQSM. Primary Care and Sports Medicine [facility]  Adjunct Instructor of Family Medicine  [employer]

## 2017-11-01 NOTE — Progress Notes (Signed)
    Postoperative Visit   History of Present Illness   Brandon Aguayo. is a 77 y.o. year old male who presents for postoperative follow-up for: R AK to BK pop BPG, ray amp R 4th and 5th toes, debridement of R 3rd toe ulcer (08/23/17).  The patient's wounds are not healed.  The patient notes near complete resolution of lower extremity symptoms.  The patient is able to complete their activities of daily living.  The patient's current symptoms are: drainage from amp site and heel site.  Pt also notes some right leg swelling  Dr. Benjamin Stain aspirated 15 mL out the seroma on 10/12/17.  R great toenail was removed also on 10/14/17.   Physical Examination   Vitals:   11/02/17 1405  BP: 131/65  Pulse: 62  Resp: 18  Temp: 100 F (37.8 C)  TempSrc: Oral  SpO2: 98%  Weight: 206 lb (93.4 kg)  Height:  (1.803 m)    RLE: AK and BK pop incision are healed, fullness in vein harvest site decreased R foot: great toe bandaged, R 4th and 5th ray amp site with some superficial wound breakdown, +granulation evident, +macerated skin flap debrided with ooze evident with debridement, medial heal ulcer: 1.5 cm in diameter with 1 cm deep, no granulation, palpable DP  Medical Decision Making   Brandon Markovitz. is a 77 y.o. year old male who presents s/p R AK pop to BK pop BPG w/ Propaten, R 4th and 5th amp, debridement of R 3rd toe ulcer   Wet-to-dry dressing daily with Hydrogel to heel and R 4th and 5th toe ray amp wounds.  Follow up in 2 weeks.  Thank you for allowing [place] to participate in this patient's care.   Leonides Sake, MD, FACS [facility] [place] Office: [phone] Pager: [phone]

## 2017-11-02 ENCOUNTER — Ambulatory Visit (INDEPENDENT_AMBULATORY_CARE_PROVIDER_SITE_OTHER): Payer: Self-pay | Admitting: Vascular Surgery

## 2017-11-02 ENCOUNTER — Encounter: Payer: Self-pay | Admitting: Vascular Surgery

## 2017-11-02 ENCOUNTER — Other Ambulatory Visit: Payer: Self-pay

## 2017-11-02 VITALS — BP 131/65 | HR 62 | Temp 100.0°F | Resp 18 | Ht 71.0 in | Wt 206.0 lb

## 2017-11-02 DIAGNOSIS — I70261 Atherosclerosis of native arteries of extremities with gangrene, right leg: Secondary | ICD-10-CM

## 2017-11-04 ENCOUNTER — Telehealth: Payer: Self-pay | Admitting: Vascular Surgery

## 2017-11-04 NOTE — Telephone Encounter (Signed)
[facility] for new wound care orders, per Dr. Imogene Burnhen.  Wet to dry with hydrogel daily to affected area. [facility], there will need to be someone in the home to teach per insurance guidelines.  She will contact patient.  Bubber Rothert E., LPN.

## 2017-11-14 ENCOUNTER — Encounter: Payer: Self-pay | Admitting: Sports Medicine

## 2017-11-14 ENCOUNTER — Ambulatory Visit (INDEPENDENT_AMBULATORY_CARE_PROVIDER_SITE_OTHER): Payer: Medicare Other | Admitting: Sports Medicine

## 2017-11-14 DIAGNOSIS — L299 Pruritus, unspecified: Secondary | ICD-10-CM | POA: Diagnosis not present

## 2017-11-14 DIAGNOSIS — L603 Nail dystrophy: Secondary | ICD-10-CM

## 2017-11-14 MED ORDER — EUCERIN EX CREA
TOPICAL_CREAM | Freq: Two times a day (BID) | CUTANEOUS | 11 refills | Status: AC
Start: 2017-11-14 — End: ?

## 2017-11-14 NOTE — Assessment & Plan Note (Signed)
Routine labs were unremarkable. He has been off of all of his narcotics for a month. 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 This is fairly diffuse, adding Eucerin cream to be used twice a day. I would like a second opinion from dermatology as well.

## 2017-11-14 NOTE — Progress Notes (Signed)
Subjective:    CC: Itching, toenail  HPI: I removed Dallas's right great toenail and did a phenol matricectomy about 4 weeks ago, he is doing extremely well, it is completely healed now.  Itching: Present now for many months, he has been off of oxycodone, hydrocodone for a month, no change in rash.  We have done routine labs including CBC, CMP without any abnormalities that would be related to an itch.  No new medications, creams.  He does have significantly dry skin.  I reviewed the past medical history, family history, social history, surgical history, and allergies today and no changes were needed.  Please see the problem list section below in epic for further details.  Past Medical History: Past Medical History:  Diagnosis Date  . Carotid artery occlusion   . Colon polyps   . Degenerative arthritis of spine   . Diverticulosis   . Hemorrhoids   . Hemorrhoids   . Hyperlipidemia   . Hypertension    2010  . Ischemia of foot 08/2017  . Sciatica   . Solar keratosis   . Vitamin B12 deficiency    Past Surgical History: Past Surgical History:  Procedure Laterality Date  . ABDOMINAL AORTOGRAM W/LOWER EXTREMITY N/A 08/18/2017   Procedure: ABDOMINAL AORTOGRAM W/LOWER EXTREMITY;  Surgeon: Fransisco Hertzhen, Brian L, MD;  [facility];  Service: Cardiovascular;  Laterality: N/A;  rt .lower extermity  . 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 BYPASS GRAFT POPLITEAL TO POPLITEAL Right 08/23/2017   Procedure: BYPASS GRAFT ABOVE THE KNEE POPLITEAL TO BELOW THE KNEE POPLITEAL RIGHT USING PROPATEN GORE VASCULAR GRAFT 6MM X 40MM;  Surgeon: Fransisco Hertzhen, Brian L, MD;  [facility];  Service: Vascular;  Laterality: Right;  . CAROTID ENDARTERECTOMY  09/21/10   Right CEA  . NECK MASS EXCISION     Social History: Social History    Socioeconomic History  . Marital status: Widowed    Spouse name: Not on file  . Number of children: 2  . Years of education: HS  . Highest education level: Not on file  Occupational History  . Occupation: retired  Engineer, productionocial Needs  . Financial resource strain: Not on file  . Food insecurity:    Worry: Not on file    Inability: Not on file  . Transportation needs:    Medical: Not on file    Non-medical: Not on file  Tobacco Use  . Smoking status: Current Every Day Smoker    Packs/day: 1.00    Years: 50.00    Pack years: 50.00    Types: Cigarettes  . Smokeless tobacco: Never Used  Substance and Sexual Activity  . Alcohol use: Yes    Alcohol/week: 0.0 oz    Comment: Patient drinks 3 beers daily  . Drug use: No  . Sexual activity: Not on file  Lifestyle  . Physical activity:    Days per week: Not on file    Minutes per session: Not on file  . Stress: Not on file  Relationships  . Social connections:    Talks on phone: Not on file    Gets together: Not on file    Attends religious service: Not on file    Active member of club or organization: Not on file    Attends meetings of clubs or organizations:  Not on file    Relationship status: Not on file  Other Topics Concern  . Not on file  Social History Narrative   Patient drinks about 2 cups of coffee daily.   Patient is right handed.   Family History: Family History  Problem Relation Age of Onset  . Dementia Mother   . Heart attack Father        First MI age 13.  Marland Kitchen Heart disease Father 61  . Heart attack Brother        CAD, prior MI  . Hypertension Brother   . ALS Sister    Allergies: Allergies  Allergen Reactions  . Pollen Extract    Medications: See med rec.  Review of Systems: No fevers, chills, night sweats, weight loss, chest pain, or shortness of breath.   Objective:    General: Well Developed, well nourished, and in no acute distress.  Neuro: Alert and oriented x3, extra-ocular muscles intact,  sensation grossly intact.  HEENT: Normocephalic, atraumatic, pupils equal round reactive to light, neck supple, no masses, no lymphadenopathy, thyroid nonpalpable.  Skin: Warm and dry, no rashes.  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  There are a few areas of neurodermatitis where he has picked to the point that he has drawn blood. Cardiac: Regular rate and rhythm, no murmurs rubs or gallops, no lower extremity edema.  Respiratory: Clear to auscultation bilaterally. Not using accessory muscles, speaking in full sentences. Right great toenail: Nailbed intact and healed.  No signs of new nail growth.  Impression and Recommendations:    Itching Routine labs were unremarkable. He has been off of all of his narcotics for a month. 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 This is fairly diffuse, adding Eucerin cream to be used twice a day. I would like a second opinion from dermatology as well.  Onychodystrophy Approximately 1 month post right great toenail excision, with phenol matricectomy. Doing really well.  No further evaluation needed. Mild devitalized tissue debridement, Band-Aid applied.  ___________________________________________ Ihor Austin. Benjamin Stain, M.D., ABFM., CAQSM. Primary Care and Sports Medicine [facility]  Adjunct Instructor of Family Medicine  [employer]

## 2017-11-14 NOTE — Assessment & Plan Note (Signed)
Approximately 1 month post right great toenail excision, with phenol matricectomy. Doing really well.  No further evaluation needed. Mild devitalized tissue debridement, Band-Aid applied.

## 2017-11-15 NOTE — Progress Notes (Signed)
    Postoperative Visit   History of Present Illness   Brandon Boresroy Nicolson Jr. is a 77 y.o. (06/18/1940) male who presents for postoperative follow-up for: R AK to BK pop BPG, ray amp R 4th and 5th toes, debridement of R 3rd toe ulcer(08/23/17). The patient's wounds are not healed. The patient notes near complete resolution of lower extremity symptoms. The patient is able to complete their activities of daily living. The patient's current symptoms ZOX:WRUEAVare:slowly healing 4th and 5th ray amp site and slowly healing heel ulcer  Dr. Benjamin Stainhekkekandam aspirated 15 mL out the seroma on 10/12/17. R great toenail was removed also on 10/14/17.   Physical Examination   Vitals:   11/16/17 1309  BP: (!) 167/70  Pulse: 97  Temp: 97.7 F (36.5 C)  SpO2: 97%  Weight: 213 lb (96.6 kg)  Height: 5\' 11"  (1.803 m)    RLE:AK and BK pop incision are healed, fullness in vein harvest site decreased R foot: 4th and 5th ray amp site healing with good granulation , wound with >50% contracture; heel ulcer looks clean but bigger 4-5 mm in diameter  Medical Decision Making   Brandon Boresroy Bramble Jr. is a 77 y.o. (01/06/1941) male  who presents s/p R AK pop to BK pop BPG w/ Propaten, R 4th and 5th amp, debridement of R 3rd toe ulcer   [facility] mgmt of R heel ulcer.  I would continue with wet-to-dry dressing meanwhile.  I suspect the 4th and 5th ray amp site will heal with just wet-to-dry dressings.  Wound check one month.  Thank you for allowing us to participate in this patient's care.   Leonides SakeBrian Minnie Shi, MD, FACS [facility] [place] Office: [phone] Pager: [phone]

## 2017-11-16 ENCOUNTER — Ambulatory Visit (INDEPENDENT_AMBULATORY_CARE_PROVIDER_SITE_OTHER): Payer: Self-pay | Admitting: Vascular Surgery

## 2017-11-16 ENCOUNTER — Encounter: Payer: Self-pay | Admitting: Vascular Surgery

## 2017-11-16 VITALS — BP 167/70 | HR 97 | Temp 97.7°F | Ht 71.0 in | Wt 213.0 lb

## 2017-11-16 DIAGNOSIS — I70261 Atherosclerosis of native arteries of extremities with gangrene, right leg: Secondary | ICD-10-CM

## 2017-11-26 ENCOUNTER — Other Ambulatory Visit: Payer: Self-pay | Admitting: Sports Medicine

## 2017-11-26 DIAGNOSIS — L299 Pruritus, unspecified: Secondary | ICD-10-CM

## 2017-12-06 ENCOUNTER — Encounter (HOSPITAL_BASED_OUTPATIENT_CLINIC_OR_DEPARTMENT_OTHER): Payer: Medicare Other | Attending: Internal Medicine

## 2017-12-06 DIAGNOSIS — F17200 Nicotine dependence, unspecified, uncomplicated: Secondary | ICD-10-CM | POA: Insufficient documentation

## 2017-12-06 DIAGNOSIS — I5032 Chronic diastolic (congestive) heart failure: Secondary | ICD-10-CM | POA: Insufficient documentation

## 2017-12-06 DIAGNOSIS — Z89421 Acquired absence of other right toe(s): Secondary | ICD-10-CM | POA: Insufficient documentation

## 2017-12-06 DIAGNOSIS — L97512 Non-pressure chronic ulcer of other part of right foot with fat layer exposed: Secondary | ICD-10-CM | POA: Insufficient documentation

## 2017-12-06 DIAGNOSIS — I70235 Atherosclerosis of native arteries of right leg with ulceration of other part of foot: Secondary | ICD-10-CM | POA: Diagnosis not present

## 2017-12-06 DIAGNOSIS — L97412 Non-pressure chronic ulcer of right heel and midfoot with fat layer exposed: Secondary | ICD-10-CM | POA: Insufficient documentation

## 2017-12-06 DIAGNOSIS — I110 Hypertensive heart disease with heart failure: Secondary | ICD-10-CM | POA: Insufficient documentation

## 2017-12-20 ENCOUNTER — Telehealth: Payer: Self-pay

## 2017-12-20 DIAGNOSIS — I70235 Atherosclerosis of native arteries of right leg with ulceration of other part of foot: Secondary | ICD-10-CM | POA: Diagnosis not present

## 2017-12-20 DIAGNOSIS — I10 Essential (primary) hypertension: Secondary | ICD-10-CM

## 2017-12-20 MED ORDER — HYDROXYZINE HCL 50 MG PO TABS: 100.0000 mg | ORAL_TABLET | Freq: Three times a day (TID) | ORAL | 3 refills | Status: DC | PRN

## 2017-12-20 NOTE — Telephone Encounter (Signed)
[name] would like something stronger for the itching.

## 2017-12-20 NOTE — Telephone Encounter (Signed)
Increase hydroxyzine to 100 mg 3 times daily

## 2017-12-21 NOTE — Telephone Encounter (Signed)
Left message advising of recommendations.  

## 2017-12-25 ENCOUNTER — Other Ambulatory Visit: Payer: Self-pay | Admitting: Sports Medicine

## 2017-12-25 DIAGNOSIS — I10 Essential (primary) hypertension: Secondary | ICD-10-CM

## 2017-12-25 DIAGNOSIS — R4589 Other symptoms and signs involving emotional state: Secondary | ICD-10-CM

## 2017-12-25 DIAGNOSIS — F329 Major depressive disorder, single episode, unspecified: Secondary | ICD-10-CM

## 2017-12-25 DIAGNOSIS — L299 Pruritus, unspecified: Secondary | ICD-10-CM

## 2017-12-27 NOTE — Progress Notes (Signed)
    Postoperative Visit   History of Present Illness   Brandon Boresroy Nikolic Jr. is a 77 y.o. (11/09/1940) male who presents for postoperative follow-up for: R AK to BK pop BPG, ray amp R 4th and 5th toes, debridement of R 3rd toe ulcer(08/23/17). The patient's wounds are not healed. The patient notesnear completeresolution of lower extremity symptoms. The patient is able to complete their activities of daily living. The patient's current symptoms are slowly healing heel ulcer.  Pt is scheduled with [facility] next Friday.   Physical Examination   Vitals:   12/28/17 1627  BP: 122/69  Pulse: (!) 54  Resp: 16  Temp: (!) 97.4 F (36.3 C)  TempSrc: Oral  SpO2: 99%  Weight: 224 lb (101.6 kg)    RLE:AK and BK pop incision are healed, fullness in vein harvest sitegreatly decreased R foot: 4th-5th amputation site healed, Heel ulcer: >50% ulcer contracture, callus softened, R foot swollen 1-2+ edema   Medical Decision Making   Brandon Boresroy Correia Jr. is a 77 y.o. (05/31/1941) male who presents s/p R AK pop to BK pop BPG w/ Propaten, R 4th and 5th amp, debridement of R 3rd toe ulcer   Continue wound care   Suspect R foot wounds will heal up completely  Follow up in 2 months for BLE ABI and RLE arterial duplex.  Thank you for allowing us to participate in this patient's care.   Leonides SakeBrian Kyliana Standen, MD, FACS [facility] [place] Office: [phone] Pager: [phone]

## 2017-12-28 ENCOUNTER — Other Ambulatory Visit: Payer: Self-pay

## 2017-12-28 ENCOUNTER — Encounter: Payer: Self-pay | Admitting: Vascular Surgery

## 2017-12-28 ENCOUNTER — Ambulatory Visit (INDEPENDENT_AMBULATORY_CARE_PROVIDER_SITE_OTHER): Payer: Self-pay | Admitting: Vascular Surgery

## 2017-12-28 VITALS — BP 122/69 | HR 54 | Temp 97.4°F | Resp 16 | Wt 224.0 lb

## 2017-12-28 DIAGNOSIS — I70261 Atherosclerosis of native arteries of extremities with gangrene, right leg: Secondary | ICD-10-CM

## 2017-12-30 DIAGNOSIS — I70235 Atherosclerosis of native arteries of right leg with ulceration of other part of foot: Secondary | ICD-10-CM | POA: Diagnosis not present

## 2018-01-04 ENCOUNTER — Other Ambulatory Visit: Payer: Self-pay

## 2018-01-04 DIAGNOSIS — I70261 Atherosclerosis of native arteries of extremities with gangrene, right leg: Secondary | ICD-10-CM

## 2018-01-13 ENCOUNTER — Encounter (HOSPITAL_BASED_OUTPATIENT_CLINIC_OR_DEPARTMENT_OTHER): Payer: Medicare Other | Attending: Internal Medicine

## 2018-01-13 DIAGNOSIS — I70235 Atherosclerosis of native arteries of right leg with ulceration of other part of foot: Secondary | ICD-10-CM | POA: Diagnosis not present

## 2018-01-13 DIAGNOSIS — I10 Essential (primary) hypertension: Secondary | ICD-10-CM | POA: Diagnosis not present

## 2018-01-13 DIAGNOSIS — F17200 Nicotine dependence, unspecified, uncomplicated: Secondary | ICD-10-CM | POA: Diagnosis not present

## 2018-01-13 DIAGNOSIS — L97419 Non-pressure chronic ulcer of right heel and midfoot with unspecified severity: Secondary | ICD-10-CM | POA: Diagnosis not present

## 2018-01-24 ENCOUNTER — Other Ambulatory Visit: Payer: Self-pay | Admitting: Sports Medicine

## 2018-01-24 DIAGNOSIS — L299 Pruritus, unspecified: Secondary | ICD-10-CM

## 2018-01-26 ENCOUNTER — Other Ambulatory Visit: Payer: Self-pay | Admitting: Sports Medicine

## 2018-01-26 DIAGNOSIS — F329 Major depressive disorder, single episode, unspecified: Principal | ICD-10-CM

## 2018-01-26 DIAGNOSIS — R4589 Other symptoms and signs involving emotional state: Secondary | ICD-10-CM

## 2018-01-27 DIAGNOSIS — I70235 Atherosclerosis of native arteries of right leg with ulceration of other part of foot: Secondary | ICD-10-CM | POA: Diagnosis not present

## 2018-02-10 ENCOUNTER — Other Ambulatory Visit: Payer: Self-pay | Admitting: Sports Medicine

## 2018-02-10 ENCOUNTER — Encounter (HOSPITAL_BASED_OUTPATIENT_CLINIC_OR_DEPARTMENT_OTHER): Payer: Medicare Other | Attending: Internal Medicine

## 2018-02-10 DIAGNOSIS — L97412 Non-pressure chronic ulcer of right heel and midfoot with fat layer exposed: Secondary | ICD-10-CM | POA: Diagnosis not present

## 2018-02-10 DIAGNOSIS — L84 Corns and callosities: Secondary | ICD-10-CM | POA: Insufficient documentation

## 2018-02-10 DIAGNOSIS — B9561 Methicillin susceptible Staphylococcus aureus infection as the cause of diseases classified elsewhere: Secondary | ICD-10-CM | POA: Diagnosis not present

## 2018-02-10 DIAGNOSIS — I10 Essential (primary) hypertension: Secondary | ICD-10-CM

## 2018-02-10 DIAGNOSIS — Z95820 Peripheral vascular angioplasty status with implants and grafts: Secondary | ICD-10-CM | POA: Insufficient documentation

## 2018-02-10 DIAGNOSIS — L89892 Pressure ulcer of other site, stage 2: Secondary | ICD-10-CM | POA: Insufficient documentation

## 2018-02-10 DIAGNOSIS — L97512 Non-pressure chronic ulcer of other part of right foot with fat layer exposed: Secondary | ICD-10-CM | POA: Diagnosis not present

## 2018-02-10 DIAGNOSIS — I70235 Atherosclerosis of native arteries of right leg with ulceration of other part of foot: Secondary | ICD-10-CM | POA: Diagnosis not present

## 2018-02-10 DIAGNOSIS — Z89431 Acquired absence of right foot: Secondary | ICD-10-CM | POA: Diagnosis not present

## 2018-02-10 DIAGNOSIS — L89512 Pressure ulcer of right ankle, stage 2: Secondary | ICD-10-CM | POA: Insufficient documentation

## 2018-02-17 ENCOUNTER — Other Ambulatory Visit (HOSPITAL_COMMUNITY)
Admission: RE | Admit: 2018-02-17 | Discharge: 2018-02-17 | Disposition: A | Discharge status: Home / Self Care | Payer: Medicare Other | Source: Other Acute Inpatient Hospital | Attending: Internal Medicine | Admitting: Internal Medicine

## 2018-02-17 DIAGNOSIS — L97411 Non-pressure chronic ulcer of right heel and midfoot limited to breakdown of skin: Secondary | ICD-10-CM | POA: Diagnosis present

## 2018-02-17 DIAGNOSIS — I70235 Atherosclerosis of native arteries of right leg with ulceration of other part of foot: Secondary | ICD-10-CM | POA: Diagnosis not present

## 2018-02-20 LAB — AEROBIC CULTURE W GRAM STAIN (SUPERFICIAL SPECIMEN)

## 2018-02-20 LAB — AEROBIC CULTURE  (SUPERFICIAL SPECIMEN)

## 2018-02-23 ENCOUNTER — Other Ambulatory Visit: Payer: Self-pay | Admitting: Sports Medicine

## 2018-02-23 DIAGNOSIS — L299 Pruritus, unspecified: Secondary | ICD-10-CM

## 2018-02-24 ENCOUNTER — Ambulatory Visit (HOSPITAL_COMMUNITY)
Admission: EM | Admit: 2018-02-24 | Discharge: 2018-02-24 | Disposition: A | Discharge status: Home / Self Care | Payer: Medicare Other | Source: Ambulatory Visit | Attending: Internal Medicine | Admitting: Internal Medicine

## 2018-02-24 ENCOUNTER — Other Ambulatory Visit (HOSPITAL_BASED_OUTPATIENT_CLINIC_OR_DEPARTMENT_OTHER): Payer: Self-pay | Admitting: Internal Medicine

## 2018-02-24 ENCOUNTER — Other Ambulatory Visit: Payer: Self-pay | Admitting: Sports Medicine

## 2018-02-24 ENCOUNTER — Other Ambulatory Visit (HOSPITAL_COMMUNITY)
Admission: RE | Admit: 2018-02-24 | Discharge: 2018-02-24 | Disposition: A | Discharge status: Home / Self Care | Payer: Medicare Other | Attending: Internal Medicine | Admitting: Internal Medicine

## 2018-02-24 ENCOUNTER — Emergency Department (HOSPITAL_COMMUNITY)
Admission: EM | Admit: 2018-02-24 | Discharge: 2018-02-24 | Disposition: A | Discharge status: Home / Self Care | Payer: Medicare Other | Source: Ambulatory Visit

## 2018-02-24 DIAGNOSIS — L97411 Non-pressure chronic ulcer of right heel and midfoot limited to breakdown of skin: Secondary | ICD-10-CM | POA: Diagnosis not present

## 2018-02-24 DIAGNOSIS — L97419 Non-pressure chronic ulcer of right heel and midfoot with unspecified severity: Secondary | ICD-10-CM

## 2018-02-24 DIAGNOSIS — Z1620 Resistance to unspecified antibiotic: Secondary | ICD-10-CM | POA: Insufficient documentation

## 2018-02-24 DIAGNOSIS — Z89421 Acquired absence of other right toe(s): Secondary | ICD-10-CM | POA: Diagnosis not present

## 2018-02-24 DIAGNOSIS — B9561 Methicillin susceptible Staphylococcus aureus infection as the cause of diseases classified elsewhere: Secondary | ICD-10-CM | POA: Diagnosis not present

## 2018-02-24 DIAGNOSIS — I10 Essential (primary) hypertension: Secondary | ICD-10-CM

## 2018-02-24 DIAGNOSIS — I70235 Atherosclerosis of native arteries of right leg with ulceration of other part of foot: Secondary | ICD-10-CM | POA: Diagnosis not present

## 2018-02-27 LAB — AEROBIC CULTURE W GRAM STAIN (SUPERFICIAL SPECIMEN)

## 2018-03-03 DIAGNOSIS — I70235 Atherosclerosis of native arteries of right leg with ulceration of other part of foot: Secondary | ICD-10-CM | POA: Diagnosis not present

## 2018-03-07 ENCOUNTER — Other Ambulatory Visit: Payer: Self-pay

## 2018-03-07 DIAGNOSIS — I70261 Atherosclerosis of native arteries of extremities with gangrene, right leg: Secondary | ICD-10-CM

## 2018-03-10 ENCOUNTER — Ambulatory Visit (INDEPENDENT_AMBULATORY_CARE_PROVIDER_SITE_OTHER): Payer: Medicare Other | Admitting: Family

## 2018-03-10 ENCOUNTER — Other Ambulatory Visit: Payer: Self-pay

## 2018-03-10 ENCOUNTER — Ambulatory Visit (INDEPENDENT_AMBULATORY_CARE_PROVIDER_SITE_OTHER)
Admission: RE | Admit: 2018-03-10 | Discharge: 2018-03-10 | Disposition: A | Discharge status: Home / Self Care | Payer: Medicare Other | Source: Ambulatory Visit | Attending: Vascular Surgery | Admitting: Vascular Surgery

## 2018-03-10 ENCOUNTER — Encounter: Payer: Self-pay | Admitting: *Deleted

## 2018-03-10 ENCOUNTER — Ambulatory Visit (HOSPITAL_COMMUNITY)
Admission: RE | Admit: 2018-03-10 | Discharge: 2018-03-10 | Disposition: A | Discharge status: Home / Self Care | Payer: Medicare Other | Source: Ambulatory Visit | Attending: Vascular Surgery | Admitting: Vascular Surgery

## 2018-03-10 ENCOUNTER — Encounter: Payer: Self-pay | Admitting: Family

## 2018-03-10 VITALS — BP 121/77 | HR 81 | Temp 98.8°F | Resp 20 | Ht 71.0 in | Wt 206.4 lb

## 2018-03-10 DIAGNOSIS — I70229 Atherosclerosis of native arteries of extremities with rest pain, unspecified extremity: Secondary | ICD-10-CM

## 2018-03-10 DIAGNOSIS — I70261 Atherosclerosis of native arteries of extremities with gangrene, right leg: Secondary | ICD-10-CM

## 2018-03-10 DIAGNOSIS — Z959 Presence of cardiac and vascular implant and graft, unspecified: Secondary | ICD-10-CM

## 2018-03-10 DIAGNOSIS — I998 Other disorder of circulatory system: Secondary | ICD-10-CM

## 2018-03-10 NOTE — Progress Notes (Signed)
[facility] [place]   CC: ischemic pain and ulcers of right foot x 3 weeks with history of peripheral artery occlusive disease  History of Present Illness [name]. is a 77 y.o. 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 Imogene Burn.   Dr. Imogene Burn last saw pt on 12-28-17. At that time RLE:AK and BK pop incision were healed, fullness in vein harvest sitegreatly decreased R foot:4th-5th amputation site healed, Heel ulcer: >50% ulcer contracture, callus softened, R foot swollen 1-2+ edema Dr. Imogene Burn advised continue wound care. He suspected that R foot wounds would heal up completely Pt was to follow up in 2 months for BLE ABI and RLE arterial duplex.  Pt reports right foot pain x 3 weeks, pain keeps him awake at night. He has ulcers in his right foot.  He has generalized pruritus x 2 months, states that his dyspnea is no worse than usual.  He takes an anitiprutic medication prescribed by his PCP. He finished antibx 2 days ago prescribed by Dr. Leanord Hawking.   Pt denies any history of stroke or TIA. He denies any cardiac problems. He denies any lung problems.     Diabetic: No Tobacco use: smoker  (1/2 ppd, started in his teens)  Pt meds include: Statin :No Betablocker: Yes ASA: No Other anticoagulants/antiplatelets: no  Past Medical History:  Diagnosis Date  . Carotid artery occlusion   . Colon polyps   . Degenerative arthritis of spine   . Diverticulosis   . Hemorrhoids   . Hemorrhoids   . Hyperlipidemia   . Hypertension    2010  . Ischemia of foot 08/2017  . Sciatica   . Solar keratosis   . Vitamin B12 deficiency     Social History Social History   Tobacco Use  . Smoking status: Current Every Day Smoker    Packs/day: 1.00    Years: 50.00    Pack years: 50.00    Types: Cigarettes  . Smokeless tobacco: Never Used  Substance Use Topics  . Alcohol use: Yes    Alcohol/week: 0.0  standard drinks    Comment: Patient drinks 3 beers daily  . Drug use: No    Family History Family History  Problem Relation Age of Onset  . Dementia Mother   . Heart attack Father        First MI age 45.  Marland Kitchen Heart disease Father 69  . Heart attack Brother        CAD, prior MI  . Hypertension Brother   . ALS Sister     Past Surgical History:  Procedure Laterality Date  . ABDOMINAL AORTOGRAM W/LOWER EXTREMITY N/A 08/18/2017   Procedure: ABDOMINAL AORTOGRAM W/LOWER EXTREMITY;  Surgeon: [name], MD;  Location: [facility] INVASIVE CV LAB;  Service: Cardiovascular;  Laterality: N/A;  rt .lower extermity  . 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 BYPASS GRAFT POPLITEAL TO POPLITEAL Right 08/23/2017   Procedure: BYPASS GRAFT ABOVE THE KNEE POPLITEAL TO BELOW THE KNEE POPLITEAL RIGHT USING PROPATEN GORE VASCULAR GRAFT X ;  Surgeon: [name], MD;  Location: [facility] OR;  Service: Vascular;  Laterality: Right;  . CAROTID ENDARTERECTOMY  09/21/10   Right CEA  .  NECK MASS EXCISION      Allergies  Allergen Reactions  . Pollen Extract     Current Outpatient Medications  Medication Sig Dispense Refill  . AMBULATORY NON FORMULARY MEDICATION Knee-high, medium compression, graduated compression stockings. Apply to lower extremities. [email or web address], Zippered Compression Stockings, medium circ, long length 1 each 0  . amLODipine (NORVASC) 5 MG tablet Take 2 tablets (10 mg total) by mouth daily.    Marland Kitchen amLODipine (NORVASC) 5 MG tablet TAKE 1 TABLET(5 MG) BY MOUTH DAILY 90 tablet 0  . azelastine (OPTIVAR) 0.05 % ophthalmic solution Place 2 drops into both eyes 2 (two) times daily. 6 mL 11  . DULoxetine (CYMBALTA) 30 MG capsule TAKE 1 CAPSULE(30 MG) BY MOUTH DAILY 90 capsule 0  . DULoxetine (CYMBALTA) 30 MG capsule  TAKE 1 CAPSULE(30 MG) BY MOUTH DAILY 30 capsule 0  . ferrous sulfate 325 (65 FE) MG EC tablet Take 1 tablet (325 mg total) by mouth 3 (three) times daily with meals. 90 tablet 11  . folic acid (FOLVITE) 1 MG tablet Take 5 tablets (5 mg total) by mouth daily. 150 tablet 11  . gabapentin (NEURONTIN) 100 MG capsule TAKE 1 CAPSULE(100 MG) BY MOUTH TWICE DAILY AS NEEDED FOR ITCHING 60 capsule 0  . hydrOXYzine (ATARAX/VISTARIL) 50 MG tablet TAKE 2 TABLETS(100 MG) BY MOUTH THREE TIMES DAILY AS NEEDED FOR ITCHING 60 tablet 0  . metoprolol succinate (TOPROL-XL) 25 MG 24 hr tablet Take 1 tablet (25 mg total) by mouth daily. 30 tablet 0  . metoprolol succinate (TOPROL-XL) 25 MG 24 hr tablet TAKE 1 TABLET(25 MG) BY MOUTH DAILY 90 tablet 0  . ondansetron (ZOFRAN-ODT) 8 MG disintegrating tablet Take 1 tablet (8 mg total) by mouth every 8 (eight) hours as needed for nausea. 20 tablet 3  . promethazine (PHENERGAN) 25 MG tablet Take 1 tablet (25 mg total) by mouth every 6 (six) hours as needed for nausea. 30 tablet 3  . Skin Protectants, Misc. (EUCERIN) cream Apply topically 2 (two) times daily. 454 g 11  . valsartan-hydrochlorothiazide (DIOVAN-HCT) 160-12.5 MG tablet TAKE 1 TABLET BY MOUTH DAILY 90 tablet 0  . vitamin B-12 (CYANOCOBALAMIN) 1000 MCG tablet Take 1 tablet (1,000 mcg total) by mouth daily. 90 tablet 3   No current facility-administered medications for this visit.     ROS: See HPI for pertinent positives and negatives.   Physical Examination  Vitals:   03/10/18 1609  BP: 121/77  Pulse: 81  Resp: 20  Temp: 98.8 F (37.1 C)  TempSrc: Oral  SpO2: 99%  Weight: 206 lb 6.4 oz (93.6 kg)  Height: 5\' 11"  (1.803 m)   Body mass index is 28.79 kg/m.  General: A&O x 3, WDWN, male. Gait: limp HENT: No gross abnormalities.  Eyes: PERRLA. Pulmonary: Respirations are non labored, CTAB, fair air movement in all fields Cardiac: regular rhythm, no detected murmur.         Carotid Bruits Right  Left   Negative Negative   Radial pulses are 1+ palpable bilaterally   Adominal aortic pulse is not palpable                         VASCULAR EXAM: Extremities with ischemic changes, without Gangrene; with open wounds. See photos below. Surgically absent right 4th and 5th toes.   Right foot, medial aspect    Right foot, lateral aspect  LE Pulses Right Left       FEMORAL  2+ palpable  2+ palpable        POPLITEAL   2+palpable   1+ palpable       POSTERIOR TIBIAL  not palpable   not palpable        DORSALIS PEDIS      ANTERIOR TIBIAL not palpable  not palpable    Abdomen: soft, NT, no palpable masses. Skin: generalized mild erythema and mild skin changes with wrinkling. See Extremities.  Musculoskeletal: no muscle wasting or atrophy. See Extremities Neurologic: A&O X 3; appropriate affect, Sensation is normal; MOTOR FUNCTION:  moving all extremities equally, motor strength 5/5 except 4/5 in right LE. Speech is fluent/normal. CN 2-12 intact except slight hearing loss. Psychiatric: Thought content is normal, mood appropriate for clinical situation.     ASSESSMENT: Brandon Lema. is a 77 y.o. male who is s/p: R AK to BK pop BPG, ray amp R 4th and 5th toes, debridement of R 3rd toe ulcer(08/23/17) He has a 3 weeks hx of right foot pain and ulcers, pain has kept him awake.   Bilateral femoral and popliteal pulses are palpable.  Right outflow artery of bypass graft seems occluded by studies today and physical exam.   1.53 serum creatinine in May 2019, will schedule for CO2 angiogram.   Dr. Randie Heinz spoke with and examined pt. See Plan  DATA  Right LE Arterial Duplex (03-10-18): Right Duplex Findings: +-----------+--------+-----+--------+----------+--------------+       PSV cm/sRatioStenosisWaveform Comments     +-----------+--------+-----+--------+----------+--------------+ ATA Distal 22          monophasic        +-----------+--------+-----+--------+----------+--------------+ PTA Distal 16          monophasic        +-----------+--------+-----+--------+----------+--------------+ PERO Distal                Not Visualized +-----------+--------+-----+--------+----------+--------------+   Right Graft #1: +------------------+--------+---------------+--------+-------------+          PSV cm/sStenosis    WaveformComments    +------------------+--------+---------------+--------+-------------+ Inflow      170   30-49% stenosisbiphasic        +------------------+--------+---------------+--------+-------------+ Prox Anastomosis 283   50-70% stenosisbiphasic        +------------------+--------+---------------+--------+-------------+ Proximal Graft  93                       +------------------+--------+---------------+--------+-------------+ Mid Graft     169                       +------------------+--------+---------------+--------+-------------+ Distal Graft   110                       +------------------+--------+---------------+--------+-------------+ Distal Anastomosis108                       +------------------+--------+---------------+--------+-------------+ Outflow                      No visualized +------------------+--------+---------------+--------+-------------+  Right Graft(s): Outflow artery past distal anastomosis not visualized.    ABI (Date: 03/10/2018):  R:   ABI: 0.29 (was 0.91 on 08-25-17),   PT: mono  DP: dampened mono  TBI:  absent  L:   ABI: 1.14 (was 1.0),   PT: tri  DP: tri  TBI: 1.14, toe pressure  107 (was 0.5) Right ABI indicates critical limb ischemia, TBI is absent. Left ABI and TBI are  normal with triphasic waveforms.     PLAN:  Based on the patient's vascular studies and examination, pt will be scheduled for arteriogram with CO2, bilateral run off, with Dr. Chestine Spore on Wednesday, 03-15-18.  Continue right foot wound care.    Charisse March, RN, MSN, FNP-C [facility] Phone: [phone]  Clinic MD: Randie Heinz  03/10/18 4:58 PM

## 2018-03-13 ENCOUNTER — Other Ambulatory Visit: Payer: Self-pay | Admitting: *Deleted

## 2018-03-14 ENCOUNTER — Other Ambulatory Visit: Payer: Self-pay | Admitting: Sports Medicine

## 2018-03-14 DIAGNOSIS — I10 Essential (primary) hypertension: Secondary | ICD-10-CM

## 2018-03-15 ENCOUNTER — Ambulatory Visit (HOSPITAL_COMMUNITY)
Admission: RE | Disposition: A | Discharge status: Home / Self Care | Payer: Self-pay | Source: Ambulatory Visit | Attending: Vascular Surgery

## 2018-03-15 ENCOUNTER — Encounter (HOSPITAL_COMMUNITY): Payer: Self-pay | Admitting: Vascular Surgery

## 2018-03-15 ENCOUNTER — Other Ambulatory Visit: Payer: Self-pay

## 2018-03-15 ENCOUNTER — Observation Stay (HOSPITAL_COMMUNITY): Payer: Medicare Other

## 2018-03-15 ENCOUNTER — Observation Stay (HOSPITAL_COMMUNITY)
Admission: RE | Admit: 2018-03-15 | Discharge: 2018-03-16 | Disposition: A | Discharge status: Home / Self Care | Payer: Medicare Other | Source: Ambulatory Visit | Attending: Vascular Surgery | Admitting: Vascular Surgery

## 2018-03-15 DIAGNOSIS — Z951 Presence of aortocoronary bypass graft: Secondary | ICD-10-CM | POA: Insufficient documentation

## 2018-03-15 DIAGNOSIS — Z86010 Personal history of colonic polyps: Secondary | ICD-10-CM | POA: Diagnosis not present

## 2018-03-15 DIAGNOSIS — N189 Chronic kidney disease, unspecified: Secondary | ICD-10-CM

## 2018-03-15 DIAGNOSIS — E785 Hyperlipidemia, unspecified: Secondary | ICD-10-CM | POA: Insufficient documentation

## 2018-03-15 DIAGNOSIS — I5032 Chronic diastolic (congestive) heart failure: Secondary | ICD-10-CM | POA: Insufficient documentation

## 2018-03-15 DIAGNOSIS — F17210 Nicotine dependence, cigarettes, uncomplicated: Secondary | ICD-10-CM | POA: Diagnosis not present

## 2018-03-15 DIAGNOSIS — N183 Chronic kidney disease, stage 3 (moderate): Secondary | ICD-10-CM | POA: Diagnosis not present

## 2018-03-15 DIAGNOSIS — I10 Essential (primary) hypertension: Secondary | ICD-10-CM

## 2018-03-15 DIAGNOSIS — L97519 Non-pressure chronic ulcer of other part of right foot with unspecified severity: Secondary | ICD-10-CM | POA: Insufficient documentation

## 2018-03-15 DIAGNOSIS — I130 Hypertensive heart and chronic kidney disease with heart failure and stage 1 through stage 4 chronic kidney disease, or unspecified chronic kidney disease: Secondary | ICD-10-CM | POA: Insufficient documentation

## 2018-03-15 DIAGNOSIS — I70221 Atherosclerosis of native arteries of extremities with rest pain, right leg: Secondary | ICD-10-CM | POA: Diagnosis present

## 2018-03-15 DIAGNOSIS — Z9109 Other allergy status, other than to drugs and biological substances: Secondary | ICD-10-CM | POA: Insufficient documentation

## 2018-03-15 DIAGNOSIS — Z89421 Acquired absence of other right toe(s): Secondary | ICD-10-CM | POA: Insufficient documentation

## 2018-03-15 DIAGNOSIS — N179 Acute kidney failure, unspecified: Secondary | ICD-10-CM | POA: Insufficient documentation

## 2018-03-15 DIAGNOSIS — Z79899 Other long term (current) drug therapy: Secondary | ICD-10-CM | POA: Insufficient documentation

## 2018-03-15 DIAGNOSIS — Z8249 Family history of ischemic heart disease and other diseases of the circulatory system: Secondary | ICD-10-CM | POA: Diagnosis not present

## 2018-03-15 DIAGNOSIS — Z9889 Other specified postprocedural states: Secondary | ICD-10-CM | POA: Insufficient documentation

## 2018-03-15 DIAGNOSIS — I739 Peripheral vascular disease, unspecified: Secondary | ICD-10-CM | POA: Diagnosis present

## 2018-03-15 DIAGNOSIS — E538 Deficiency of other specified B group vitamins: Secondary | ICD-10-CM | POA: Insufficient documentation

## 2018-03-15 DIAGNOSIS — I998 Other disorder of circulatory system: Secondary | ICD-10-CM | POA: Diagnosis not present

## 2018-03-15 HISTORY — PX: ABDOMINAL AORTOGRAM W/LOWER EXTREMITY: CATH118223

## 2018-03-15 HISTORY — PX: PERIPHERAL VASCULAR INTERVENTION: CATH118257

## 2018-03-15 HISTORY — DX: Peripheral vascular disease, unspecified: I73.9

## 2018-03-15 LAB — POTASSIUM
POTASSIUM: 6.6 mmol/L — AB (ref 3.5–5.1)
POTASSIUM: 5.8 mmol/L — AB (ref 3.5–5.1)

## 2018-03-15 LAB — CBC
HEMATOCRIT: 42.5 % (ref 39.0–52.0)
Hemoglobin: 13 g/dL (ref 13.0–17.0)
MCH: 28.6 pg (ref 26.0–34.0)
MCHC: 30.6 g/dL (ref 30.0–36.0)
MCV: 93.6 fL (ref 80.0–100.0)
NRBC: 0 % (ref 0.0–0.2)
PLATELETS: 226 10*3/uL (ref 150–400)
RBC: 4.54 MIL/uL (ref 4.22–5.81)
RDW: 13.7 % (ref 11.5–15.5)
WBC: 10.8 10*3/uL — AB (ref 4.0–10.5)

## 2018-03-15 LAB — URINALYSIS, ROUTINE W REFLEX MICROSCOPIC
BILIRUBIN URINE: NEGATIVE
Glucose, UA: NEGATIVE mg/dL
HGB URINE DIPSTICK: NEGATIVE
Ketones, ur: NEGATIVE mg/dL
Leukocytes, UA: NEGATIVE
NITRITE: NEGATIVE
PH: 5 (ref 5.0–8.0)
Protein, ur: NEGATIVE mg/dL
SPECIFIC GRAVITY, URINE: 1.04 — AB (ref 1.005–1.030)

## 2018-03-15 LAB — BASIC METABOLIC PANEL
Anion gap: 10 (ref 5–15)
BUN: 27 mg/dL — AB (ref 8–23)
CO2: 22 mmol/L (ref 22–32)
CREATININE: 2.3 mg/dL — AB (ref 0.61–1.24)
Calcium: 8.7 mg/dL — ABNORMAL LOW (ref 8.9–10.3)
Chloride: 107 mmol/L (ref 98–111)
GFR calc Af Amer: 30 mL/min — ABNORMAL LOW (ref >60)
GFR calc non Af Amer: 26 mL/min — ABNORMAL LOW (ref >60)
Glucose, Bld: 105 mg/dL — ABNORMAL HIGH (ref 70–99)
Potassium: 5 mmol/L (ref 3.5–5.1)
Sodium: 139 mmol/L (ref 135–145)

## 2018-03-15 LAB — CREATININE, SERUM
Creatinine, Ser: 2.44 mg/dL — ABNORMAL HIGH (ref 0.61–1.24)
GFR calc Af Amer: 28 mL/min — ABNORMAL LOW (ref >60)
GFR, EST NON AFRICAN AMERICAN: 24 mL/min — AB (ref >60)

## 2018-03-15 SURGERY — ABDOMINAL AORTOGRAM W/LOWER EXTREMITY
Anesthesia: LOCAL | Laterality: Right

## 2018-03-15 MED ORDER — HEPARIN (PORCINE) IN NACL 1000-0.9 UT/500ML-% IV SOLN
INTRAVENOUS | Status: AC
  Filled 2018-03-15: qty 1000

## 2018-03-15 MED ORDER — DULOXETINE HCL 30 MG PO CPEP
30.0000 mg | ORAL_CAPSULE | Freq: Every day | ORAL | Status: DC
  Administered 2018-03-15 – 2018-03-16 (×2): 30 mg via ORAL
  Filled 2018-03-15 (×2): qty 1

## 2018-03-15 MED ORDER — PATIROMER SORBITEX CALCIUM 8.4 G PO PACK
16.8000 g | PACK | Freq: Every day | ORAL | Status: DC
  Administered 2018-03-15 – 2018-03-16 (×2): 16.8 g via ORAL
  Filled 2018-03-15 (×3): qty 2

## 2018-03-15 MED ORDER — HYDROCHLOROTHIAZIDE 12.5 MG PO CAPS
12.5000 mg | ORAL_CAPSULE | Freq: Every day | ORAL | Status: DC
  Administered 2018-03-15: 12.5 mg via ORAL
  Filled 2018-03-15 (×2): qty 1

## 2018-03-15 MED ORDER — MIDAZOLAM HCL 2 MG/2ML IJ SOLN
INTRAMUSCULAR | Status: DC | PRN
  Administered 2018-03-15: 1 mg via INTRAVENOUS

## 2018-03-15 MED ORDER — CLOPIDOGREL BISULFATE 75 MG PO TABS
75.0000 mg | ORAL_TABLET | Freq: Every day | ORAL | Status: DC
  Administered 2018-03-16: 75 mg via ORAL
  Filled 2018-03-15: qty 1

## 2018-03-15 MED ORDER — FENTANYL CITRATE (PF) 100 MCG/2ML IJ SOLN
INTRAMUSCULAR | Status: AC
  Filled 2018-03-15: qty 2

## 2018-03-15 MED ORDER — IODIXANOL 320 MG/ML IV SOLN
INTRAVENOUS | Status: DC | PRN
  Administered 2018-03-15: 15 mL via INTRA_ARTERIAL

## 2018-03-15 MED ORDER — HEPARIN SODIUM (PORCINE) 1000 UNIT/ML IJ SOLN
INTRAMUSCULAR | Status: DC | PRN
  Administered 2018-03-15: 9000 [IU] via INTRAVENOUS

## 2018-03-15 MED ORDER — ONDANSETRON HCL 4 MG/2ML IJ SOLN
4.0000 mg | Freq: Four times a day (QID) | INTRAMUSCULAR | Status: DC | PRN
  Administered 2018-03-16 (×2): 4 mg via INTRAVENOUS
  Filled 2018-03-15 (×2): qty 2

## 2018-03-15 MED ORDER — SODIUM CHLORIDE 0.9% FLUSH: 3.0000 mL | INTRAVENOUS | Status: DC | PRN

## 2018-03-15 MED ORDER — HEPARIN SODIUM (PORCINE) 5000 UNIT/ML IJ SOLN
5000.0000 [IU] | Freq: Three times a day (TID) | INTRAMUSCULAR | Status: DC
  Administered 2018-03-16 (×2): 5000 [IU] via SUBCUTANEOUS
  Filled 2018-03-15 (×2): qty 1

## 2018-03-15 MED ORDER — LABETALOL HCL 5 MG/ML IV SOLN: 10.0000 mg | INTRAVENOUS | Status: DC | PRN

## 2018-03-15 MED ORDER — METOPROLOL SUCCINATE ER 25 MG PO TB24
25.0000 mg | ORAL_TABLET | Freq: Every day | ORAL | Status: DC
  Administered 2018-03-15 – 2018-03-16 (×2): 25 mg via ORAL
  Filled 2018-03-15 (×2): qty 1

## 2018-03-15 MED ORDER — HEPARIN (PORCINE) IN NACL 1000-0.9 UT/500ML-% IV SOLN
INTRAVENOUS | Status: AC
  Filled 2018-03-15: qty 500

## 2018-03-15 MED ORDER — VALSARTAN-HYDROCHLOROTHIAZIDE 160-12.5 MG PO TABS: 1.0000 | ORAL_TABLET | Freq: Every day | ORAL | Status: DC

## 2018-03-15 MED ORDER — LIDOCAINE HCL (PF) 1 % IJ SOLN
INTRAMUSCULAR | Status: AC
  Filled 2018-03-15: qty 30

## 2018-03-15 MED ORDER — ACETAMINOPHEN 325 MG PO TABS
650.0000 mg | ORAL_TABLET | ORAL | Status: DC | PRN
  Administered 2018-03-16: 650 mg via ORAL
  Filled 2018-03-15: qty 2

## 2018-03-15 MED ORDER — LIDOCAINE HCL (PF) 1 % IJ SOLN
INTRAMUSCULAR | Status: DC | PRN
  Administered 2018-03-15: 20 mL via INTRADERMAL

## 2018-03-15 MED ORDER — HEPARIN (PORCINE) IN NACL 1000-0.9 UT/500ML-% IV SOLN
INTRAVENOUS | Status: DC | PRN
  Administered 2018-03-15 (×2): 500 mL

## 2018-03-15 MED ORDER — FENTANYL CITRATE (PF) 100 MCG/2ML IJ SOLN
INTRAMUSCULAR | Status: DC | PRN
  Administered 2018-03-15 (×2): 50 ug via INTRAVENOUS

## 2018-03-15 MED ORDER — SODIUM CHLORIDE 0.9 % IV SOLN: 250.0000 mL | INTRAVENOUS | Status: DC | PRN

## 2018-03-15 MED ORDER — LABETALOL HCL 5 MG/ML IV SOLN
INTRAVENOUS | Status: DC | PRN
  Administered 2018-03-15: 10 mg via INTRAVENOUS

## 2018-03-15 MED ORDER — GABAPENTIN 100 MG PO CAPS
100.0000 mg | ORAL_CAPSULE | Freq: Two times a day (BID) | ORAL | Status: DC
  Administered 2018-03-15 – 2018-03-16 (×2): 100 mg via ORAL
  Filled 2018-03-15 (×2): qty 1

## 2018-03-15 MED ORDER — IRBESARTAN 150 MG PO TABS
150.0000 mg | ORAL_TABLET | Freq: Every day | ORAL | Status: DC
  Administered 2018-03-15: 150 mg via ORAL
  Filled 2018-03-15 (×2): qty 1

## 2018-03-15 MED ORDER — MIDAZOLAM HCL 2 MG/2ML IJ SOLN
INTRAMUSCULAR | Status: AC
  Filled 2018-03-15: qty 2

## 2018-03-15 MED ORDER — SODIUM CHLORIDE 0.9 % IV SOLN: INTRAVENOUS | Status: AC

## 2018-03-15 MED ORDER — SODIUM CHLORIDE 0.9% FLUSH
3.0000 mL | Freq: Two times a day (BID) | INTRAVENOUS | Status: DC
  Administered 2018-03-16: 3 mL via INTRAVENOUS

## 2018-03-15 MED ORDER — ASPIRIN EC 81 MG PO TBEC
81.0000 mg | DELAYED_RELEASE_TABLET | Freq: Every day | ORAL | Status: DC
  Administered 2018-03-15 – 2018-03-16 (×2): 81 mg via ORAL
  Filled 2018-03-15 (×2): qty 1

## 2018-03-15 MED ORDER — SODIUM CHLORIDE 0.9 % IV SOLN
INTRAVENOUS | Status: DC
  Administered 2018-03-15: 11:00:00 via INTRAVENOUS

## 2018-03-15 MED ORDER — HYDRALAZINE HCL 20 MG/ML IJ SOLN: 5.0000 mg | INTRAMUSCULAR | Status: DC | PRN

## 2018-03-15 MED ORDER — OXYCODONE HCL 5 MG PO TABS
5.0000 mg | ORAL_TABLET | ORAL | Status: DC | PRN
  Administered 2018-03-15: 10 mg via ORAL
  Administered 2018-03-16: 5 mg via ORAL
  Filled 2018-03-15: qty 2
  Filled 2018-03-15: qty 1

## 2018-03-15 MED ORDER — AMLODIPINE BESYLATE 5 MG PO TABS
5.0000 mg | ORAL_TABLET | Freq: Every day | ORAL | Status: DC
  Administered 2018-03-15 – 2018-03-16 (×2): 5 mg via ORAL
  Filled 2018-03-15 (×2): qty 1

## 2018-03-15 SURGICAL SUPPLY — 29 items
BAG SNAP BAND KOVER 36X36 (MISCELLANEOUS) ×3 IMPLANT
BALLN MUSTANG 4X60X135 (BALLOONS) ×3
BALLOON MUSTANG 4X60X135 (BALLOONS) ×2 IMPLANT
CATH BEACON 5 .035 65 KMP TIP (CATHETERS) ×3 IMPLANT
CATH OMNI FLUSH 5F 65CM (CATHETERS) ×3 IMPLANT
CATH QUICKCROSS .035X135CM (MICROCATHETER) ×3 IMPLANT
CATH SOFT-VU 4F 65 STRAIGHT (CATHETERS) ×2 IMPLANT
CATH SOFT-VU STRAIGHT 4F 65CM (CATHETERS) ×1
CATH TEMPO AQUA 5F 100CM (CATHETERS) ×3 IMPLANT
COVER DOME SNAP 22 D (MISCELLANEOUS) ×3 IMPLANT
DEVICE CLOSURE MYNXGRIP 6/7F (Vascular Products) ×3 IMPLANT
DEVICE TORQUE H2O (MISCELLANEOUS) ×3 IMPLANT
FILTER CO2 0.2 MICRON (VASCULAR PRODUCTS) ×3 IMPLANT
GUIDEWIRE ANGLED .035X150CM (WIRE) ×3 IMPLANT
GUIDEWIRE ANGLED .035X260CM (WIRE) ×3 IMPLANT
KIT MICROPUNCTURE NIT STIFF (SHEATH) ×3 IMPLANT
KIT PV (KITS) ×3 IMPLANT
RESERVOIR CO2 (VASCULAR PRODUCTS) ×3 IMPLANT
SET FLUSH CO2 (MISCELLANEOUS) ×3 IMPLANT
SHEATH FLEX ANSEL ANG 6F 45CM (SHEATH) ×3 IMPLANT
SHEATH PINNACLE 5F 10CM (SHEATH) ×3 IMPLANT
SHEATH PINNACLE 6F 10CM (SHEATH) ×3 IMPLANT
SHEATH PROBE COVER 6X72 (BAG) ×3 IMPLANT
STENT TIGRIS 5X80X120 (Permanent Stent) ×3 IMPLANT
SYR MEDRAD MARK V 150ML (SYRINGE) ×3 IMPLANT
TRANSDUCER W/STOPCOCK (MISCELLANEOUS) ×3 IMPLANT
TRAY PV CATH (CUSTOM PROCEDURE TRAY) ×3 IMPLANT
WIRE BENTSON .035X145CM (WIRE) ×3 IMPLANT
WIRE HI TORQ VERSACORE J 260CM (WIRE) ×3 IMPLANT

## 2018-03-15 NOTE — Progress Notes (Signed)
Janne Napoleon, Rn informed of Potassium level. New order placed for stat potassium. Blood sent to  lab.

## 2018-03-15 NOTE — H&P (Signed)
History and Physical Interval Note:  03/15/2018 10:25 AM  [name].  has presented today for surgery, with the diagnosis of claudication  The various methods of treatment have been discussed with the patient and family. After consideration of risks, benefits and other options for treatment, the patient has consented to  Procedure(s): ABDOMINAL AORTOGRAM W/LOWER EXTREMITY (N/A) as a surgical intervention .  The patient's history has been reviewed, patient examined, no change in status, stable for surgery.  I have reviewed the patient's chart and labs.  Questions were answered to the patient's satisfaction.     Right lower extremity angio with CO2.  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  Rest pain x 3 weeks and tissue loss.  Cephus Shelling  [facility] [name]   CC: ischemic pain and ulcers of right foot x 3 weeks with history of peripheral artery occlusive disease  History of Present Illness [name]. is a 77 y.o. 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 Imogene Burn.   Dr. Imogene Burn last saw pt on 12-28-17. At that time RLE:AK and BK pop incision were healed, fullness in vein harvest sitegreatly decreased R foot:4th-5th amputation site healed, Heel ulcer:>50% ulcer contracture, callus softened, R foot swollen 1-2+ edema Dr. Imogene Burn advised continue wound care. He suspected that R foot wounds would heal up completely Pt was to follow up in 2 months for BLE ABI and RLE arterial duplex.  Pt reports right foot pain x 3 weeks, pain keeps him awake at night. He has ulcers in his right foot.  He has generalized pruritus x 2 months, states that his dyspnea is no worse than usual.  He takes an anitiprutic medication prescribed by his PCP. He finished antibx 2 days ago prescribed by Dr. Leanord Hawking.   Pt denies any history of stroke or TIA. He denies any cardiac problems. He denies any lung  problems.     Diabetic: No Tobacco use: smoker  (1/2 ppd, started in his teens)  Pt meds include: Statin :No Betablocker: Yes ASA: No Other anticoagulants/antiplatelets: no      Past Medical History:  Diagnosis Date  . Carotid artery occlusion   . Colon polyps   . Degenerative arthritis of spine   . Diverticulosis   . Hemorrhoids   . Hemorrhoids   . Hyperlipidemia   . Hypertension    2010  . Ischemia of foot 08/2017  . Sciatica   . Solar keratosis   . Vitamin B12 deficiency     Social History Social History        Tobacco Use  . Smoking status: Current Every Day Smoker    Packs/day: 1.00    Years: 50.00    Pack years: 50.00    Types: Cigarettes  . Smokeless tobacco: Never Used  Substance Use Topics  . Alcohol use: Yes    Alcohol/week: 0.0 standard drinks    Comment: Patient drinks 3 beers daily  . Drug use: No    Family History      Family History  Problem Relation Age of Onset  . Dementia Mother   . Heart attack Father        First MI age 36.  Marland Kitchen Heart disease Father 5  . Heart attack Brother        CAD, prior MI  . Hypertension Brother   . ALS Sister  Past Surgical History:  Procedure Laterality Date  . ABDOMINAL AORTOGRAM W/LOWER EXTREMITY N/A 08/18/2017   Procedure: ABDOMINAL AORTOGRAM W/LOWER EXTREMITY;  Surgeon: [name], MD;  Location: [facility] INVASIVE CV LAB;  Service: Cardiovascular;  Laterality: N/A;  rt .lower extermity  . 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 BYPASS GRAFT POPLITEAL TO POPLITEAL Right 08/23/2017   Procedure: BYPASS GRAFT ABOVE THE KNEE POPLITEAL TO BELOW THE KNEE POPLITEAL RIGHT USING PROPATEN GORE VASCULAR GRAFT X ;  Surgeon: [name], MD;  Location: [facility] OR;  Service: Vascular;   Laterality: Right;  . CAROTID ENDARTERECTOMY  09/21/10   Right CEA  . NECK MASS EXCISION          Allergies  Allergen Reactions  . Pollen Extract           Current Outpatient Medications  Medication Sig Dispense Refill  . AMBULATORY NON FORMULARY MEDICATION Knee-high, medium compression, graduated compression stockings. Apply to lower extremities. [email or web address], Zippered Compression Stockings, medium circ, long length 1 each 0  . amLODipine (NORVASC) 5 MG tablet Take 2 tablets (10 mg total) by mouth daily.    Marland Kitchen amLODipine (NORVASC) 5 MG tablet TAKE 1 TABLET(5 MG) BY MOUTH DAILY 90 tablet 0  . azelastine (OPTIVAR) 0.05 % ophthalmic solution Place 2 drops into both eyes 2 (two) times daily. 6 mL 11  . DULoxetine (CYMBALTA) 30 MG capsule TAKE 1 CAPSULE(30 MG) BY MOUTH DAILY 90 capsule 0  . DULoxetine (CYMBALTA) 30 MG capsule TAKE 1 CAPSULE(30 MG) BY MOUTH DAILY 30 capsule 0  . ferrous sulfate 325 (65 FE) MG EC tablet Take 1 tablet (325 mg total) by mouth 3 (three) times daily with meals. 90 tablet 11  . folic acid (FOLVITE) 1 MG tablet Take 5 tablets (5 mg total) by mouth daily. 150 tablet 11  . gabapentin (NEURONTIN) 100 MG capsule TAKE 1 CAPSULE(100 MG) BY MOUTH TWICE DAILY AS NEEDED FOR ITCHING 60 capsule 0  . hydrOXYzine (ATARAX/VISTARIL) 50 MG tablet TAKE 2 TABLETS(100 MG) BY MOUTH THREE TIMES DAILY AS NEEDED FOR ITCHING 60 tablet 0  . metoprolol succinate (TOPROL-XL) 25 MG 24 hr tablet Take 1 tablet (25 mg total) by mouth daily. 30 tablet 0  . metoprolol succinate (TOPROL-XL) 25 MG 24 hr tablet TAKE 1 TABLET(25 MG) BY MOUTH DAILY 90 tablet 0  . ondansetron (ZOFRAN-ODT) 8 MG disintegrating tablet Take 1 tablet (8 mg total) by mouth every 8 (eight) hours as needed for nausea. 20 tablet 3  . promethazine (PHENERGAN) 25 MG tablet Take 1 tablet (25 mg total) by mouth every 6 (six) hours as needed for nausea. 30 tablet 3  . Skin Protectants, Misc. (EUCERIN) cream Apply  topically 2 (two) times daily. 454 g 11  . valsartan-hydrochlorothiazide (DIOVAN-HCT) 160-12.5 MG tablet TAKE 1 TABLET BY MOUTH DAILY 90 tablet 0  . vitamin B-12 (CYANOCOBALAMIN) 1000 MCG tablet Take 1 tablet (1,000 mcg total) by mouth daily. 90 tablet 3   No current facility-administered medications for this visit.     ROS: See HPI for pertinent positives and negatives.   Physical Examination     Vitals:   03/10/18 1609  BP: 121/77  Pulse: 81  Resp: 20  Temp: 98.8 F (37.1 C)  TempSrc: Oral  SpO2: 99%  Weight: 206 lb 6.4 oz (93.6  kg)  Height: 5\' 11"  (1.803 m)   Body mass index is 28.79 kg/m.  General: A&O x 3, WDWN, male. Gait: limp HENT: No gross abnormalities.  Eyes: PERRLA. Pulmonary: Respirations are non labored, CTAB, fair air movement in all fields Cardiac: regular rhythm, no detected murmur.         Carotid Bruits Right Left   Negative Negative   Radial pulses are 1+ palpable bilaterally   Adominal aortic pulse is not palpable                         VASCULAR EXAM: Extremities with ischemic changes, without Gangrene; with open wounds. See photos below. Surgically absent right 4th and 5th toes.   Right foot, medial aspect    Right foot, lateral aspect                                                                                                                                                         LE Pulses Right Left       FEMORAL  2+ palpable  2+ palpable        POPLITEAL   2+palpable   1+ palpable       POSTERIOR TIBIAL  not palpable   not palpable        DORSALIS PEDIS      ANTERIOR TIBIAL not palpable  not palpable    Abdomen: soft, NT, no palpable masses. Skin: generalized mild erythema and mild skin changes with wrinkling. See Extremities.  Musculoskeletal: no muscle wasting or atrophy.     See Extremities Neurologic: A&O X 3; appropriate affect, Sensation is normal; MOTOR FUNCTION:  moving all extremities  equally, motor strength 5/5 except 4/5 in right LE. Speech is fluent/normal. CN 2-12 intact except slight hearing loss. Psychiatric: Thought content is normal, mood appropriate for clinical situation.     ASSESSMENT: Ziyad Dyar. is a 77 y.o. male who is s/p: R AK to BK pop BPG, ray amp R 4th and 5th toes, debridement of R 3rd toe ulcer(08/23/17) He has a 3 weeks hx of right foot pain and ulcers, pain has kept him awake.   Bilateral femoral and popliteal pulses are palpable.  Right outflow artery of bypass graft seems occluded by studies today and physical exam.   1.53 serum creatinine in May 2019, will schedule for CO2 angiogram.   Dr. Randie Heinz spoke with and examined pt. See Plan  DATA  Right LE Arterial Duplex (03-10-18): Right Duplex Findings: +-----------+--------+-----+--------+----------+--------------+       PSV cm/sRatioStenosisWaveform Comments    +-----------+--------+-----+--------+----------+--------------+ ATA Distal 22          monophasic        +-----------+--------+-----+--------+----------+--------------+ PTA Distal 16          monophasic        +-----------+--------+-----+--------+----------+--------------+  PERO Distal                Not Visualized +-----------+--------+-----+--------+----------+--------------+   Right Graft #1: +------------------+--------+---------------+--------+-------------+          PSV cm/sStenosis    WaveformComments    +------------------+--------+---------------+--------+-------------+ Inflow      170   30-49% stenosisbiphasic        +------------------+--------+---------------+--------+-------------+ Prox Anastomosis 283   50-70% stenosisbiphasic        +------------------+--------+---------------+--------+-------------+ Proximal Graft  93                        +------------------+--------+---------------+--------+-------------+ Mid Graft     169                       +------------------+--------+---------------+--------+-------------+ Distal Graft   110                       +------------------+--------+---------------+--------+-------------+ Distal Anastomosis108                       +------------------+--------+---------------+--------+-------------+ Outflow                      No visualized +------------------+--------+---------------+--------+-------------+  Right Graft(s): Outflow artery past distal anastomosis not visualized.    ABI (Date: 03/10/2018):  R:  ? ABI: 0.29 (was 0.91 on 08-25-17),  ? PT: mono ? DP: dampened mono ? TBI:  absent  L:  ? ABI: 1.14 (was 1.0),  ? PT: tri ? DP: tri ? TBI: 1.14, toe pressure 107 (was 0.5) Right ABI indicates critical limb ischemia, TBI is absent. Left ABI and TBI are normal with triphasic waveforms.     PLAN:  Based on the patient's vascular studies and examination, pt will be scheduled for arteriogram with CO2, bilateral run off, with Dr. Chestine Spore on Wednesday, 03-15-18.  Continue right foot wound care.    Charisse March, RN, MSN, FNP-C [facility] Phone: [phone]  Clinic MD: Randie Heinz  03/10/18 4:58 PM  Cephus Shelling, MD [facility] [place] Office: [phone] Pager: [phone]

## 2018-03-15 NOTE — Progress Notes (Addendum)
Lab called with potassium of 6.6, but states blood was slightly hemolyzed. New order place to redraw K level. Lab called and informed of the need to draw. Janne Napoleon called and informed.

## 2018-03-15 NOTE — Progress Notes (Signed)
Janne Napoleon, RN called and informed pt has no one to stay with him, and he will need to stay overnight in the hospital.

## 2018-03-15 NOTE — Consult Note (Signed)
[name]. Admit Date: 03/15/2018 03/15/2018 Arita Miss Requesting Physician:  Chestine Spore MD  Reason for Consult:  Renal Failure, Hyperkalemia HPI:  54M with sig hx/o PAD with right lower extremity bypass in March of this year who presented for scheduled angiogram after complaining of worsening ischemic pain and ulcers in the right foot for the past 3 weeks.  Preprocedural labs with serum creatinine of 2.5, potassium 6.3, BUN 46, bicarbonate 27.  There was some hemolysis and repeat potassium was 5.8.  Patient underwent procedure including recanalization of native right popliteal artery chronic total occlusion.  Much of the procedure it was done with carbon dioxide angiography.  Only 15 mL of contrast was used.  It appears the patient has a baseline CKD 3 with a creatinine between 1.5 and 1.7.  No previous renal imaging available.  Urine analysis from earlier this year without proteinuria or hematuria.  Angiogram from March of this year reviewed, left renal artery was occluded.  No dedicated renal imaging available in the system.  Patient denies lower urinary tract symptoms other than nocturia.  No nonsteroidal use.  No hematuria.  Past few days he does feel like he has been unable to keep up with fluids, feeling parched.  PMH Incudes:  PAD, hx/o RLE bypass 08/2017, hx/o R 4+5 toe amputation  OA  HLD  HTN on ARB/HCTZ, CCB  Sciatica  Tobacco user3   Creat (mg/dL)  Date Value  16/03/9603 1.53 (H)  08/09/2017 1.38 (H)  12/15/2015 1.63 (H)  03/18/2015 1.48 (H)  01/27/2015 1.85 (H)  01/13/2015 1.60 (H)  01/06/2015 1.79 (H)  01/02/2015 1.82 (H)  12/30/2014 1.56 (H)  12/26/2014 1.66 (H)   Creatinine, Ser (mg/dL)  Date Value  54/02/8118 2.50 (H)  03/15/2018 2.70 (H)  08/26/2017 1.51 (H)  08/25/2017 1.67 (H)  08/24/2017 1.55 (H)  08/23/2017 1.52 (H)  08/21/2017 1.53 (H)  08/19/2017 1.47 (H)  08/18/2017 1.49 (H)  08/17/2017 1.58 (H)  ] I/Os:  ROS NSAIDS: Denies IV  Contrast 15 mL contrast today TMP/SMX no exposure Hypotension none Balance of 12 systems is negative w/ exceptions as above  PMH  Past Medical History:  Diagnosis Date  . Carotid artery occlusion   . Colon polyps   . Degenerative arthritis of spine   . Diverticulosis   . Hemorrhoids   . Hemorrhoids   . Hyperlipidemia   . Hypertension    2010  . Ischemia of foot 08/2017  . Sciatica   . Solar keratosis   . Vitamin B12 deficiency    PSH  Past Surgical History:  Procedure Laterality Date  . ABDOMINAL AORTOGRAM W/LOWER EXTREMITY N/A 08/18/2017   Procedure: ABDOMINAL AORTOGRAM W/LOWER EXTREMITY;  Surgeon: [name], MD;  Location: [facility] INVASIVE CV LAB;  Service: Cardiovascular;  Laterality: N/A;  rt .lower extermity  . AMPUTATION Right 08/23/2017   Procedure: AMPUTATION FOURTH AND FIFTH TOES RIGHT FOOT, Debridement of Right Heal and third toe.;  Surgeon: [name], MD;  Location: [facility] OR;  Service: Vascular;  Laterality: Right;  . BYPASS GRAFT POPLITEAL TO POPLITEAL Right 08/23/2017   Procedure: BYPASS GRAFT ABOVE THE KNEE POPLITEAL TO BELOW THE KNEE POPLITEAL RIGHT USING PROPATEN GORE VASCULAR GRAFT X ;  Surgeon: [name], MD;  Location: [facility] OR;  Service: Vascular;  Laterality: Right;  . CAROTID ENDARTERECTOMY  09/21/10   Right CEA  . NECK MASS EXCISION     FH  Family History  Problem Relation Age of Onset  .  Dementia Mother   . Heart attack Father        First MI age 69.  Marland Kitchen Heart disease Father 61  . Heart attack Brother        CAD, prior MI  . Hypertension Brother   . ALS Sister    SH  reports that he has been smoking cigarettes. He has a 50.00 pack-year smoking history. He has never used smokeless tobacco. He reports that he drinks alcohol. He reports that he does not use drugs. Allergies  Allergies  Allergen Reactions  . Pollen Extract    Home medications Prior to Admission medications   Medication Sig Start Date End Date Taking? Authorizing Provider   acetaminophen (TYLENOL) 500 MG tablet Take 1,000 mg by mouth 4 (four) times daily as needed for moderate pain.   Yes [provider]  amLODipine (NORVASC) 5 MG tablet TAKE 1 TABLET(5 MG) BY MOUTH DAILY Patient taking differently: Take 5 mg by mouth daily at 12 noon.  12/25/17  Yes Monica Becton, MD  Carboxymethylcellul-Glycerin (LUBRICATING EYE DROPS OP) Place 1 drop into both eyes daily as needed (irritation).   Yes [provider]  DULoxetine (CYMBALTA) 30 MG capsule TAKE 1 CAPSULE(30 MG) BY MOUTH DAILY Patient taking differently: Take 30 mg by mouth daily at 12 noon.  01/26/18  Yes Monica Becton, MD  ferrous sulfate 325 (65 FE) MG EC tablet Take 1 tablet (325 mg total) by mouth 3 (three) times daily with meals. Patient taking differently: Take 325 mg by mouth daily at 12 noon.  02/17/15  Yes Monica Becton, MD  folic acid (FOLVITE) 1 MG tablet Take 5 tablets (5 mg total) by mouth daily. Patient taking differently: Take 5 mg by mouth daily at 12 noon.  02/21/15  Yes Monica Becton, MD  gabapentin (NEURONTIN) 100 MG capsule TAKE 1 CAPSULE(100 MG) BY MOUTH TWICE DAILY AS NEEDED FOR ITCHING Patient taking differently: Take 100 mg by mouth 2 (two) times daily.  02/23/18  Yes Monica Becton, MD  hydrOXYzine (ATARAX/VISTARIL) 50 MG tablet Take 2 tablets (100 mg total) by mouth 3 (three) times daily. 03/14/18  Yes Monica Becton, MD  metoprolol succinate (TOPROL-XL) 25 MG 24 hr tablet TAKE 1 TABLET(25 MG) BY MOUTH DAILY Patient taking differently: Take 25 mg by mouth daily at 12 noon.  02/12/18  Yes Monica Becton, MD  Skin Protectants, Misc. (EUCERIN) cream Apply topically 2 (two) times daily. Patient taking differently: Apply 1 application topically as needed (itching).  11/14/17  Yes Monica Becton, MD  valsartan-hydrochlorothiazide (DIOVAN-HCT) 160-12.5 MG tablet TAKE 1 TABLET BY MOUTH DAILY Patient taking differently: Take  1 tablet by mouth daily at 12 noon.  12/26/17  Yes Monica Becton, MD  AMBULATORY NON FORMULARY MEDICATION Knee-high, medium compression, graduated compression stockings. Apply to lower extremities. [email or web address], Zippered Compression Stockings, medium circ, long length 01/27/15   Monica Becton, MD  azelastine (OPTIVAR) 0.05 % ophthalmic solution Place 2 drops into both eyes 2 (two) times daily. Patient not taking: Reported on 03/14/2018 08/23/16   Monica Becton, MD  diphenhydrAMINE (BENADRYL) 25 MG tablet Take 25 mg by mouth daily as needed for allergies.    [provider]  vitamin B-12 (CYANOCOBALAMIN) 1000 MCG tablet Take 1 tablet (1,000 mcg total) by mouth daily. Patient taking differently: Take 1,000 mcg by mouth daily at 12 noon.  06/16/15   Monica Becton, MD    Current Medications Scheduled Meds: .  aspirin EC  81 mg Oral Daily  . [START ON 03/16/2018] clopidogrel  75 mg Oral Q breakfast  . [START ON 03/16/2018] heparin  5,000 Units Subcutaneous Q8H  . patiromer  16.8 g Oral Daily  . sodium chloride flush  3 mL Intravenous Q12H   Continuous Infusions: . sodium chloride 100 mL/hr at 03/15/18 1510  . sodium chloride     PRN Meds:.sodium chloride, acetaminophen, hydrALAZINE, labetalol, ondansetron (ZOFRAN) IV, oxyCODONE, sodium chloride flush  CBC Recent Labs  Lab 03/15/18 0908 03/15/18 0930  HGB 9.2* 11.9*  HCT 27.0* 35.0*   Basic Metabolic Panel Recent Labs  Lab 03/15/18 0908 03/15/18 0930 03/15/18 0944 03/15/18 1053  NA 136 138  --   --   K 6.8* 6.3* 6.6* 5.8*  CL 110 106  --   --   GLUCOSE 95 102*  --   --   BUN 40* 46*  --   --   CREATININE 2.70* 2.50*  --   --    Physical Exam  Blood pressure 139/75, pulse 64, temperature 97.6 F (36.4 C), temperature source Oral, resp. rate 18, height 5\' 11"  (1.803 m), weight 91.6 kg, SpO2 99 %. GEN: NAD ENT: NCAT EYES: EOMI, glasses CV: RRR, normal S1 and S2 PULM: CTA B but  diminished in the bases ABD: Obese, soft, nontender SKIN: No rashes or lesions EXT: Trace peripheral edema  Assessment 77 year old male with peripheral arterial disease and progressive right lower extremity ischemic symptoms who underwent angiography today; found to have acute on chronic renal failure with mild hyperkalemia.  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  I wonder if resident renal findings are reflective of mild hypovolemia while using ARB/thiazide.  1. AoCKD3; ?hypovolemia + ARB 2. Hyperkalemia, mild  3. PAD, s/p angiogram 03/15/18 4. HTN, BP stable 5. ? Solitary L kidney, ischemic nephroathy  Plan 1. NS @ 152mL/hr overnight 2. Patiromer 16.8gm x1 3. Renal US 4. UA 5. Hold ARB/Thiazide 6. Daily weights, Daily Renal Panel, Strict I/Os, Avoid nephrotoxins (NSAIDs, judicious IV Contrast) 7. PM BMP ordered, AM RFP ordered 8. WIll follow up again in AM  Sabra Heck MD [phone] pgr 03/15/2018, 4:01 PM

## 2018-03-15 NOTE — Op Note (Signed)
Patient name: [name]. MRN: [id number] DOB: 1940/07/11 Sex: male  03/15/2018 Pre-operative Diagnosis: Critical limb ischemia of the right lower extremity with tissue loss in setting of previous AK pop to BK pop bypass Post-operative diagnosis:  Same Surgeon:  Cephus Shelling, MD Procedure Performed: 1.  Ultrasound-guided access of the left common femoral artery 2.  CO2 aortogram 3.  CO2 right lower extremity arteriogram with selection of tertiary branches 4.  Recanalization of the native right popliteal artery chronic total occlusion with balloon angioplasty and stent placement (4 mm x 60 mm Mustang, 5 mm x 100 mm Tigris stent) 5.  88 minutes of monitored moderate conscious sedation time 6.  Mynx closure of the left common femoral artery  Indications: Patient is a 77 year old male who recently presented to clinic with 3 weeks of increasing rest pain in his right lower extremity.  He also has nonhealing wounds on his right heel as well as his right lateral foot that have been ongoing since his bypass.  Patient previously underwent a right above-knee to below-knee popliteal bypass in the right leg with propaten graft Dr. Imogene Burn.  We recommended proceeding back to the operating room for right lower extremity arteriogram and possible intervention pending our findings after risks and benefits were discussed.  Findings:  1.  Preoperative labs revealed that patient has acute on chronic renal insufficiency and now has a creatinine of 2.7 (baseline 1.5) with potassium of 5.8 (hyperkalemia).  I talked to nephrology before the procedure and they recommended admission post op for observation and treatment of his hyperkalemia.  Given his worsening renal dysfunction I did not feel lysis of his bypass was an option, but I should proceed with the procedure given immediate limb threat. 2.  CO2 aortogram showed no significant aortoiliac disease.  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 3.  Right lower extremity arteriogram showed widely patent common femoral, profunda, and SFA.  His above-knee to below-knee popliteal bypass is occluded and I could see only a short stump of both the proximal and distal anastomosis.  His native popliteal artery is occluded in the above-knee segment and reconstitutes in the below-knee segment for an approximate 5 cm distance.  He has patent three-vessel runoff in the right lower extremity.  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   Procedure:  The patient was identified in the holding area and taken to room 8.  The patient was then placed supine on the table and prepped and draped in the usual sterile fashion.  A time out was called.  Ultrasound was used to evaluate the left common femoral artery.  It was patent .  A digital ultrasound image was acquired.  A micropuncture needle was used to access the left common femoral artery under ultrasound guidance.  An 018 wire was advanced without resistance and a micropuncture sheath was placed.  The 018 wire was removed and a benson wire was placed.  The micropuncture sheath was exchanged for a 5 french sheath.  An omniflush catheter was advanced over the wire to the level of L-1.  An abdominal angiogram was obtained with CO2.  This showed no significant aortoiliac disease as noted above.  He did have a small infrarenal aneurysm.  Ultimately had trouble getting our Omni Flush catheter the track over the other side after cannulating the contralateral common iliac.  Ultimately I had to use a soft angled glide to get down his right  SFA for additional support and then used a straight 4 French flush catheter to cannulate his right common femoral artery from the left groin.  A right lower extremity arteriogram was then obtained with CO2.  Pertinent findings are noted above but patient's above-knee to below-knee propatent bypass was occluded.  He had a occlusion of his native popliteal  artery above the knee with reconstitution of the below-knee popliteal artery and a patent tibial trifurcation.  Given his renal dysfunction as noted above with hyperkalemia and acute on chronic renal issues I did not think trying to cannulate his bypass and lyse him overnight was a reasonable option.  As a result I attempted to cannulate his native popliteal artery and recanalize this chronic total occlusion.  I used a long versa core wire that was placed into the SFA and exchanged for a long 6 [place] Ansell sheath in the left groin.  That point in time the patient given 9000 units of IV heparin.  I then used a quick cross catheter and a soft angled Glidewire and I was able to cross his chronic total occlusion of his native above popliteal artery into his below-knee popliteal artery.  I did a brief hand-injection of contrast to confirm I was in the native below-knee popliteal artery which we were indeed in the true lumen.  At that point time I selected a 4 mm x 60 mm Mustang and angioplastied his popliteal artery diseased segment including the CTO.  A brief arteriogram with contrast showed now patency after angioplasty with some residual disease >30%.  I thought that this artery going to require a stent and I selected a 5 mm x 100 mm Tigris Gore stent that was then deployed from the below-knee popliteal artery into the above-knee popliteal artery crossing the diseased segment.  After the stent was deployed it was postdilated with a 4 mm x 60 mm Mustang again.  A final right lower extremity arteriogram showed patent native popliteal artery now with less than 30% residual stenosis.  He had a patent trifurcation with a patent anterior tibial peroneal and posterior tibial artery.  I wanted to limit the amount of contrast he got but I did try to obtain a foot shot that showed filling of the posterior tibial below the ankle but it was hard to see his anterior tibial given significant foot motion.  I suspect he has a fair  amount of small vessel disease in his right foot that is also limiting healing.  At this point in time, wires and catheters were removed and a short 6 French sheath was placed in the left groin.  A Mynx closure device was deployed in the left groin.  He was taken to PACU in stable condition.  At the completion of the case he had dorsalis pedis and posterior tibial signals in the right foot.  Condition: Stable  Anesthesia: Moderate Sedation  Contrast: 15 mL, CO2 for rest case   Cephus Shelling, MD [facility] Office: [phone] Pager: [phone]  Cephus Shelling

## 2018-03-16 ENCOUNTER — Other Ambulatory Visit: Payer: Self-pay

## 2018-03-16 ENCOUNTER — Encounter (HOSPITAL_COMMUNITY): Payer: Self-pay | Admitting: General Practice

## 2018-03-16 DIAGNOSIS — I70221 Atherosclerosis of native arteries of extremities with rest pain, right leg: Secondary | ICD-10-CM | POA: Diagnosis not present

## 2018-03-16 LAB — RENAL FUNCTION PANEL
ALBUMIN: 2.3 g/dL — AB (ref 3.5–5.0)
Anion gap: 8 (ref 5–15)
BUN: 28 mg/dL — ABNORMAL HIGH (ref 8–23)
CALCIUM: 8.4 mg/dL — AB (ref 8.9–10.3)
CO2: 23 mmol/L (ref 22–32)
Chloride: 107 mmol/L (ref 98–111)
Creatinine, Ser: 2.27 mg/dL — ABNORMAL HIGH (ref 0.61–1.24)
GFR, EST AFRICAN AMERICAN: 31 mL/min — AB (ref >60)
GFR, EST NON AFRICAN AMERICAN: 26 mL/min — AB (ref >60)
Glucose, Bld: 119 mg/dL — ABNORMAL HIGH (ref 70–99)
PHOSPHORUS: 4.5 mg/dL (ref 2.5–4.6)
Potassium: 4.8 mmol/L (ref 3.5–5.1)
SODIUM: 138 mmol/L (ref 135–145)

## 2018-03-16 LAB — POCT I-STAT, CHEM 8
BUN: 40 mg/dL — ABNORMAL HIGH (ref 8–23)
BUN: 46 mg/dL — ABNORMAL HIGH (ref 8–23)
CHLORIDE: 110 mmol/L (ref 98–111)
CHLORIDE: 106 mmol/L (ref 98–111)
Calcium, Ion: 1.19 mmol/L (ref 1.15–1.40)
Calcium, Ion: 1.19 mmol/L (ref 1.15–1.40)
Creatinine, Ser: 2.7 mg/dL — ABNORMAL HIGH (ref 0.61–1.24)
Creatinine, Ser: 2.5 mg/dL — ABNORMAL HIGH (ref 0.61–1.24)
GLUCOSE: 102 mg/dL — AB (ref 70–99)
Glucose, Bld: 95 mg/dL (ref 70–99)
HEMATOCRIT: 27 % — AB (ref 39.0–52.0)
HEMATOCRIT: 35 % — AB (ref 39.0–52.0)
HEMOGLOBIN: 9.2 g/dL — AB (ref 13.0–17.0)
HEMOGLOBIN: 11.9 g/dL — AB (ref 13.0–17.0)
POTASSIUM: 6.8 mmol/L — AB (ref 3.5–5.1)
POTASSIUM: 6.3 mmol/L — AB (ref 3.5–5.1)
SODIUM: 136 mmol/L (ref 135–145)
SODIUM: 138 mmol/L (ref 135–145)
TCO2: 23 mmol/L (ref 22–32)
TCO2: 27 mmol/L (ref 22–32)

## 2018-03-16 MED ORDER — HYDROXYZINE HCL 50 MG PO TABS: 50.0000 mg | ORAL_TABLET | Freq: Three times a day (TID) | ORAL | Status: AC

## 2018-03-16 MED ORDER — ASPIRIN 81 MG PO TBEC: 81.0000 mg | DELAYED_RELEASE_TABLET | Freq: Every day | ORAL | Status: AC

## 2018-03-16 MED ORDER — CLOPIDOGREL BISULFATE 75 MG PO TABS: 75.0000 mg | ORAL_TABLET | Freq: Every day | ORAL | 11 refills | Status: AC

## 2018-03-16 MED FILL — CLOPIDOGREL 75 MG TABLET: 75 | 30 days supply | Qty: 30 | Fill #0 | Status: TO

## 2018-03-16 MED FILL — Heparin Sod (Porcine)-NaCl IV Soln 1000 Unit/500ML-0.9%: INTRAVENOUS | Qty: 1000 | Status: AC

## 2018-03-16 NOTE — Progress Notes (Signed)
[facility] [place] [place]  Subjective  - POD#1 s/p recanalization of right popliteal artery (bypass occluded).  States right foot feels much better.   Objective (!) 142/68 63 (!) 97.3 F (36.3 C) (Oral) 20 96%  Intake/Output Summary (Last 24 hours) at 03/16/2018 0732 Last data filed at 03/16/2018 0604 Gross per 24 hour  Intake 1016.67 ml  Output 450 ml  Net 566.67 ml    General: Left groin c/d/i with no hematoma Right foot with weakly palpable DP  Laboratory Lab Results: Recent Labs    03/15/18 0930 03/15/18 1534  WBC  --  10.8*  HGB 11.9* 13.0  HCT 35.0* 42.5  PLT  --  226   BMET Recent Labs    03/15/18 1800 03/16/18 0252  NA 139 138  K 5.0 4.8  CL 107 107  CO2 22 23  GLUCOSE 105* 119*  BUN 27* 28*  CREATININE 2.30* 2.27*  CALCIUM 8.7* 8.4*    COAG Lab Results  Component Value Date   INR 0.91 09/17/2010   INR 0.90 09/02/2010   No results found for: PTT  Assessment/Planning: POD#1 s/p recanalization of right popliteal artery (bypass occluded).   Appreciate nephrology assistance, Cr 2.2 today and K normal 4.8.  Plan for discharge today after hydration overnight as long as nephrology has no additional concerns. Start Plavix in addition to aspirin - new popliteal stent. Will arrange follow-up with me in one month.   Cephus Shelling 03/16/2018 7:32 AM -- Cephus Shelling

## 2018-03-16 NOTE — Progress Notes (Signed)
AVS and all discharge instructions given to Pt and his caregiver. Completed all education and medication management, Peripheral IV catheter removed, all Pt's belongings gave back to Pt .He and his caregiver had fully understanding and did not have any questions at this time. Pt leaved the  unit with an RN accompany via wheel chair at 04:15 pm.   Filiberto Pinks, RN

## 2018-03-16 NOTE — Discharge Summary (Signed)
Discharge Summary    [name]. 1941/05/08 77 y.o. male  [id number]  Admission Date: 03/15/2018  Discharge Date: 03/16/18  Physician: Cephus Shelling, MD  Admission Diagnosis: PVD (peripheral vascular disease) (HCC) [I73.9]   HPI:   This is a 77 y.o. male R AK to BK pop BPG, ray amp R 4th and 5th toes, debridement of R 3rd toe ulcer(08/23/17)by Dr. Imogene Burn.   Dr. Imogene Burn last saw pt on 12-28-17. At that timeRLE:AK and BK pop incisionwere healed, fullness in vein harvest sitegreatly decreased R foot:4th-5th amputation site healed, Heel ulcer:>50% ulcer contracture, callus softened, R foot swollen 1-2+ edema Dr. Imogene Burn advised continue wound care. He suspected thatR foot wounds wouldheal up completely Pt was to follow up in 2 months for BLE ABI and RLE arterial duplex.  Pt reports right foot pain x 3 weeks, pain keeps him awake at night. He has ulcers in his right foot.  He has generalized pruritus x 2 months, states that his dyspnea is no worse than usual.  He takes an anitiprutic medication prescribed by his PCP. He finished antibx 2 days ago prescribed by Dr. Leanord Hawking.   Pt denies any history of stroke or TIA. He denies any cardiac problems. He denies any lung problems.  Hospital Course:  The patient was admitted to the hospital and taken to the operating room on 03/15/2018 and underwent: Procedure Performed: 1.  Ultrasound-guided access of the left common femoral artery 2.  CO2 aortogram 3.  CO2 right lower extremity arteriogram with selection of tertiary branches 4.  Recanalization of the native right popliteal artery chronic total occlusion with balloon angioplasty and stent placement (4 mm x 60 mm Mustang, 5 mm x 100 mm Tigris stent) 5.  88 minutes of monitored moderate conscious sedation time 6.  Mynx closure of the left common femoral artery    Findings:  1.  Preoperative labs revealed that patient has acute on chronic renal insufficiency and now has a  creatinine of 2.7 (baseline 1.5) with potassium of 5.8 (hyperkalemia).  I talked to nephrology before the procedure and they recommended admission post op for observation and treatment of his hyperkalemia.  Given his worsening renal dysfunction I did not feel lysis of his bypass was an option, but I should proceed with the procedure given immediate limb threat. 2.  CO2 aortogram showed no significant aortoiliac disease.  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  Small infra-renal aneurysm. 3.  Right lower extremity arteriogram showed widely patent common femoral, profunda, and SFA.  His above-knee to below-knee popliteal bypass is occluded and I could see only a short stump of both the proximal and distal anastomosis.  His native popliteal artery is occluded in the above-knee segment and reconstitutes in the below-knee segment for an approximate 5 cm distance.  He has patent three-vessel runoff in the right lower extremity.  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  The pt tolerated the procedure well and was transported to the PACU in good condition.   By POD 1, his renal function had improved to 2.2 with hydration.  His ARB was discontinued.  He is started on Plavix in addition to aspirin.  He had a weakly palpable right DP pulse. Left groin without hematoma.   Per renal:  Patient is improved this morning  Okay for discharge, would not resume Diovan (valsartan)  We will set up office visit with me in less than 1 month and labs for next week  Follow blood pressures for now without additional medication; if elevated would increase amlodipine to 10 mg  His hydroxyzine dose was decreased to 50mg  tid due to his renal function.  This was discussed with inpatient pharmacy.  The remainder of the hospital course consisted of increasing mobilization and increasing intake of solids without difficulty.  CBC    Component Value Date/Time   WBC 10.8 (H) 03/15/2018 1534   RBC  4.54 03/15/2018 1534   HGB 13.0 03/15/2018 1534   HCT 42.5 03/15/2018 1534   PLT 226 03/15/2018 1534   MCV 93.6 03/15/2018 1534   MCH 28.6 03/15/2018 1534   MCHC 30.6 03/15/2018 1534   RDW 13.7 03/15/2018 1534   LYMPHSABS 0.7 08/24/2017 0251   MONOABS 1.1 (H) 08/24/2017 0251   EOSABS 0.5 08/24/2017 0251   BASOSABS 0.0 08/24/2017 0251    BMET    Component Value Date/Time   NA 138 03/16/2018 0252   K 4.8 03/16/2018 0252   CL 107 03/16/2018 0252   CO2 23 03/16/2018 0252   GLUCOSE 119 (H) 03/16/2018 0252   BUN 28 (H) 03/16/2018 0252   CREATININE 2.27 (H) 03/16/2018 0252   CREATININE 1.53 (H) 10/12/2017 1105   CALCIUM 8.4 (L) 03/16/2018 0252   GFRNONAA 26 (L) 03/16/2018 0252   GFRNONAA 50 (L) 08/09/2017 0948   GFRAA 31 (L) 03/16/2018 0252   GFRAA 58 (L) 08/09/2017 0948      Discharge Instructions    Discharge patient   Complete by:  As directed    Discharge disposition:  01-Home or Self Care   Discharge patient date:  03/16/2018      Discharge Diagnosis:  PVD (peripheral vascular disease) (HCC) [I73.9]  Secondary Diagnosis: Patient Active Problem List   Diagnosis Date Noted  . PVD (peripheral vascular disease) (HCC) 03/15/2018  . Itching 10/28/2017  . Postoperative seroma of subcutaneous tissue after non-dermatologic procedure 10/12/2017  . Onychodystrophy 10/12/2017  . 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 Gangrene of right foot (HCC) 08/24/2017  . 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 Acute renal failure superimposed on stage 3 chronic kidney disease (HCC)   . PAD (peripheral artery disease) (HCC) 08/23/2017  . Peripheral vascular disease (HCC)   . Preoperative clearance   . ARF (acute renal failure) (HCC) 08/16/2017  . Ischemic foot 08/15/2017  . Infection of right foot 08/09/2017  . Depressed mood 08/09/2017  . Allergic conjunctivitis 08/23/2016  . 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 Abnormality of gait 01/27/2015  . Chronic diastolic heart failure (HCC) 01/02/2015  . Preventive measure 01/05/2013  . Occlusion and stenosis of carotid artery without mention of cerebral infarction 11/12/2011  . Carotid stenosis 04/16/2011  . Lumbar degenerative disc disease 08/03/2010  . HEMORRHOIDS 03/11/2009  . Hyperlipidemia 01/29/2009  . CIGARETTE SMOKER 10/04/2008  . Essential hypertension, benign 10/04/2008   Past Medical History:  Diagnosis Date  . Carotid artery occlusion   . Colon polyps   . Degenerative arthritis of spine   . Diverticulosis   . Hemorrhoids   . Hemorrhoids   . Hyperlipidemia   . Hypertension    2010  . Ischemia of foot 08/2017  . Sciatica   . Solar keratosis   . Vitamin B12 deficiency      Allergies as of 03/16/2018      Reactions   Pollen Extract       Medication List  STOP taking these medications   azelastine 0.05 % ophthalmic solution Commonly known as:  OPTIVAR   valsartan-hydrochlorothiazide 160-12.5 MG tablet Commonly known as:  DIOVAN-HCT     TAKE these medications   acetaminophen 500 MG tablet Commonly known as:  TYLENOL Take 1,000 mg by mouth 4 (four) times daily as needed for moderate pain.   AMBULATORY NON FORMULARY MEDICATION Knee-high, medium compression, graduated compression stockings. Apply to lower extremities. [email or web address], Zippered Compression Stockings, medium circ, long length   amLODipine 5 MG tablet Commonly known as:  NORVASC TAKE 1 TABLET(5 MG) BY MOUTH DAILY What changed:  See the new instructions.   aspirin 81 MG EC tablet Take 1 tablet (81 mg total) by mouth daily.   clopidogrel 75 MG tablet Commonly known as:  PLAVIX Take 1 tablet (75 mg total) by mouth daily with breakfast.   diphenhydrAMINE 25 MG tablet Commonly known as:  BENADRYL Take 25 mg by mouth daily as needed for allergies.   DULoxetine 30 MG capsule Commonly known  as:  CYMBALTA TAKE 1 CAPSULE(30 MG) BY MOUTH DAILY What changed:  See the new instructions.   eucerin cream Apply topically 2 (two) times daily. What changed:    how much to take  when to take this  reasons to take this   ferrous sulfate 325 (65 FE) MG EC tablet Take 1 tablet (325 mg total) by mouth 3 (three) times daily with meals. What changed:  when to take this   folic acid 1 MG tablet Commonly known as:  FOLVITE Take 5 tablets (5 mg total) by mouth daily. What changed:  when to take this   gabapentin 100 MG capsule Commonly known as:  NEURONTIN TAKE 1 CAPSULE(100 MG) BY MOUTH TWICE DAILY AS NEEDED FOR ITCHING What changed:  See the new instructions.   hydrOXYzine 50 MG tablet Commonly known as:  ATARAX/VISTARIL Take 1 tablet (50 mg total) by mouth 3 (three) times daily. What changed:  how much to take   LUBRICATING EYE DROPS OP Place 1 drop into both eyes daily as needed (irritation).   metoprolol succinate 25 MG 24 hr tablet Commonly known as:  TOPROL-XL TAKE 1 TABLET(25 MG) BY MOUTH DAILY What changed:  See the new instructions.   vitamin B-12 1000 MCG tablet Commonly known as:  CYANOCOBALAMIN Take 1 tablet (1,000 mcg total) by mouth daily. What changed:  when to take this       Prescriptions given: Plavix 75mg  daily #30 eleven refills  Instructions:   [facility]  Discharge Instructions  Lower Extremity Angiogram; Angioplasty/Stenting  Please refer to the following instructions for your post-procedure care. Your surgeon or physician assistant will discuss any changes with you.  Activity  Avoid lifting more than 8 pounds (1 gallons of milk) for 72 hours (3 days) after your procedure. You may walk as much as you can tolerate. It's OK to drive after 72 hours.  Bathing/Showering  You may shower the day after your procedure. If you have a bandage, you may remove it at 24- 48 hours. Clean your incision site with  mild soap and water. Pat the area dry with a clean towel.  Diet  Resume your pre-procedure diet. There are no special food restrictions following this procedure. All patients with peripheral vascular disease should follow a low fat/low cholesterol diet. In order to heal from your surgery, it is CRITICAL to get adequate nutrition. Your body requires vitamins, minerals, and protein. Vegetables are the best  source of vitamins and minerals. Vegetables also provide the perfect balance of protein. Processed food has little nutritional value, so try to avoid this.  Medications  Resume taking all of your medications unless your doctor tells you not to. If your incision is causing pain, you may take over-the-counter pain relievers such as acetaminophen (Tylenol)  Decrease dose of Hydroxyzine to 50mg  tid due to renal function. Discontinue  Valsartan/HCTZ due to renal function  Follow Up  Follow up will be arranged at the time of your procedure. You may have an office visit scheduled or may be scheduled for surgery. Ask your surgeon if you have any questions.  Follow up with [facility] in one week to check renal function.  Please call us immediately for any of the following conditions: .Severe or worsening pain your legs or feet at rest or with walking. .Increased pain, redness, drainage at your groin puncture site. .Fever of 101 degrees or higher. .If you have any mild or slow bleeding from your puncture site: lie down, apply firm constant pressure over the area with a piece of gauze or a clean wash cloth for 30 minutes- no peeking!, call 911 right away if you are still bleeding after 30 minutes, or if the bleeding is heavy and unmanageable.  Reduce your risk factors of vascular disease:  . Stop smoking. If you would like help call [facility] at 1-800-QUIT-NOW ((508)270-5672) or [place] at 469-328-8121. . Manage your cholesterol . Maintain a desired weight . Control your  diabetes . Keep your blood pressure down .  If you have any questions, please call the office at [phone]  Disposition: home  Patient's condition: is Good  Follow up: 1. Dr. Chestine Spore in 4 weeks (sent to office by Dr. Chestine Spore) 2. [facility] in 2 weeks 3. [facility] 1 week   Doreatha Massed, [place] [facility] [phone] 03/16/2018  1:15 PM

## 2018-03-16 NOTE — Discharge Instructions (Signed)
° °  [facility]  Discharge Instructions  Lower Extremity Angiogram; Angioplasty/Stenting  Please refer to the following instructions for your post-procedure care. Your surgeon or physician assistant will discuss any changes with you.  Activity  Avoid lifting more than 8 pounds (1 gallons of milk) for 72 hours (3 days) after your procedure. You may walk as much as you can tolerate. It's OK to drive after 72 hours.  Bathing/Showering  You may shower the day after your procedure. If you have a bandage, you may remove it at 24- 48 hours. Clean your incision site with mild soap and water. Pat the area dry with a clean towel.  Diet  Resume your pre-procedure diet. There are no special food restrictions following this procedure. All patients with peripheral vascular disease should follow a low fat/low cholesterol diet. In order to heal from your surgery, it is CRITICAL to get adequate nutrition. Your body requires vitamins, minerals, and protein. Vegetables are the best source of vitamins and minerals. Vegetables also provide the perfect balance of protein. Processed food has little nutritional value, so try to avoid this.  Medications  Resume taking all of your medications unless your doctor tells you not to. If your incision is causing pain, you may take over-the-counter pain relievers such as acetaminophen (Tylenol)  Decrease dose of Hydroxyzine to 50mg  tid due to renal function. Discontinue  Valsartan/HCTZ due to renal function  Follow Up  Follow up will be arranged at the time of your procedure. You may have an office visit scheduled or may be scheduled for surgery. Ask your surgeon if you have any questions.  Follow up with [facility] in one week to check renal function.  Please call us immediately for any of the following conditions: Severe or worsening pain your legs or feet at rest or with walking. Increased pain, redness, drainage at your  groin puncture site. Fever of 101 degrees or higher. If you have any mild or slow bleeding from your puncture site: lie down, apply firm constant pressure over the area with a piece of gauze or a clean wash cloth for 30 minutes- no peeking!, call 911 right away if you are still bleeding after 30 minutes, or if the bleeding is heavy and unmanageable.  Reduce your risk factors of vascular disease:   Stop smoking. If you would like help call [facility] at 1-800-QUIT-NOW ((364) 582-8633) or [place] at [phone].  Manage your cholesterol  Maintain a desired weight  Control your diabetes  Keep your blood pressure down   If you have any questions, please call the office at [phone]

## 2018-03-16 NOTE — Progress Notes (Signed)
Pt had one episode of nausea and vomiting, 4 mg of Zofran was administered with effective relief.

## 2018-03-16 NOTE — Progress Notes (Signed)
Admit: 03/15/2018 LOS: 0  45M with AoCKD and hyperkalemia  Subjective:  Marland Kitchen SCr improved . K normalized . Feels well  . Renal US 9 cm right kidney and 6 cm left kidney.  No obstruction  10/09 0701 - 10/10 0700 In: 1016.7 [P.O.:600; I.V.:416.7] Out: 450 [Urine:450]  Filed Weights   03/15/18 0829  Weight: 91.6 kg    Scheduled Meds: . amLODipine  5 mg Oral Daily  . aspirin EC  81 mg Oral Daily  . clopidogrel  75 mg Oral Q breakfast  . DULoxetine  30 mg Oral Daily  . gabapentin  100 mg Oral BID  . heparin  5,000 Units Subcutaneous Q8H  . metoprolol succinate  25 mg Oral Daily  . patiromer  16.8 g Oral Daily  . sodium chloride flush  3 mL Intravenous Q12H   Continuous Infusions: . sodium chloride     PRN Meds:.sodium chloride, acetaminophen, hydrALAZINE, labetalol, ondansetron (ZOFRAN) IV, oxyCODONE, sodium chloride flush  Current Labs: reviewed    Physical Exam:  Blood pressure 134/71, pulse 75, temperature 98.3 F (36.8 C), temperature source Oral, resp. rate 20, height 5\' 11"  (1.803 m), weight 91.6 kg, SpO2 100 %. GEN: NAD ENT: NCAT EYES: EOMI, glasses CV: RRR, normal S1 and S2 PULM: CTA B but diminished in the bases ABD: Obese, soft, nontender SKIN: No rashes or lesions EXT: Trace peripheral edema  A 1. AoCKD3; ?hypovolemia + ARB 2. Hyperkalemia, mild  3. PAD, s/p angiogram 03/15/18 4. HTN, BP stable 5. Functional Solitary R kidney, ischemic nephroathy  P . Patient is improved this morning . Okay for discharge, would not resume Diovan (valsartan) . We will set up office visit with me in less than 1 month and labs for next week . Follow blood pressures for now without additional medication; if elevated would increase amlodipine to 10 mg  Will sign off for now.  Please call with any questions or concerns.    Sabra Heck MD 03/16/2018, 12:21 PM  Recent Labs  Lab 03/15/18 0930  03/15/18 1053 03/15/18 1534 03/15/18 1800 03/16/18 0252  NA 138  --   --    --  139 138  K 6.3*   < > 5.8*  --  5.0 4.8  CL 106  --   --   --  107 107  CO2  --   --   --   --  22 23  GLUCOSE 102*  --   --   --  105* 119*  BUN 46*  --   --   --  27* 28*  CREATININE 2.50*  --   --  2.44* 2.30* 2.27*  CALCIUM  --   --   --   --  8.7* 8.4*  PHOS  --   --   --   --   --  4.5   < > = values in this interval not displayed.   Recent Labs  Lab 03/15/18 0908 03/15/18 0930 03/15/18 1534  WBC  --   --  10.8*  HGB 9.2* 11.9* 13.0  HCT 27.0* 35.0* 42.5  MCV  --   --  93.6  PLT  --   --  226

## 2018-03-17 ENCOUNTER — Telehealth: Payer: Self-pay | Admitting: Vascular Surgery

## 2018-03-17 NOTE — Telephone Encounter (Signed)
-----   Message from Cephus Shelling, MD sent at 03/15/2018  2:31 PM EDT ----- Patient name: [name].    MRN: [id number]        DOB: May 29, 1941            Sex: male  03/15/2018 Pre-operative Diagnosis: Critical limb ischemia of the right lower extremity with tissue loss in setting of previous AK pop to BK pop bypass Post-operative diagnosis:  Same Surgeon:  Cephus Shelling, MD Procedure Performed: 1.  Ultrasound-guided access of the left common femoral artery 2.  CO2 aortogram 3.  CO2 right lower extremity arteriogram with selection of tertiary branches 4.  Recanalization of the native right popliteal artery chronic total occlusion with balloon angioplasty and stent placement (4 mm x 60 mm Mustang, 5 mm x 100 mm Tigris stent) 5.  88 minutes of monitored moderate conscious sedation time 6.  Mynx closure of the left common femoral artery  Indications: Patient is a 77 year old male who recently presented to clinic with 3 weeks of increasing rest pain in his right lower extremity.  He also has nonhealing wounds on his right heel as well as his right lateral foot that have been ongoing since his bypass.  Patient previously underwent a right above-knee to below-knee popliteal bypass in the right leg with propaten graft Dr. Imogene Burn.  We recommended proceeding back to the operating room for right lower extremity arteriogram and possible intervention pending our findings after risks and benefits were discussed.  Findings:  1.  Preoperative labs revealed that patient has acute on chronic renal insufficiency and now has a creatinine of 2.7 (baseline 1.5) with potassium of 5.8 (hyperkalemia).  I talked to nephrology before the procedure and they recommended admission post op for observation and treatment of his hyperkalemia.  Given his worsening renal dysfunction I did not feel lysis of his bypass was an option, but I should proceed with the procedure given immediate limb threat. 2.  CO2 aortogram  showed no significant aortoiliac disease.  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  Small infra-renal aneurysm. 3.  Right lower extremity arteriogram showed widely patent common femoral, profunda, and SFA.  His above-knee to below-knee popliteal bypass is occluded and I could see only a short stump of both the proximal and distal anastomosis.  His native popliteal artery is occluded in the above-knee segment and reconstitutes in the below-knee segment for an approximate 5 cm distance.  He has patent three-vessel runoff in the right lower extremity.  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              #He can follow-up with me in one month with ABI and arterial duplex of right leg.  Thanks,  Thayer Ohm

## 2018-03-17 NOTE — Telephone Encounter (Signed)
sch app lvm 04/24/18 10am ABI 11am LE art 04/25/18 345pm p/o MD

## 2018-03-25 ENCOUNTER — Other Ambulatory Visit: Payer: Self-pay | Admitting: Sports Medicine

## 2018-03-25 DIAGNOSIS — L299 Pruritus, unspecified: Secondary | ICD-10-CM

## 2018-03-25 DIAGNOSIS — I10 Essential (primary) hypertension: Secondary | ICD-10-CM

## 2018-03-29 ENCOUNTER — Other Ambulatory Visit: Payer: Self-pay

## 2018-03-29 DIAGNOSIS — I998 Other disorder of circulatory system: Secondary | ICD-10-CM

## 2018-03-29 DIAGNOSIS — Z959 Presence of cardiac and vascular implant and graft, unspecified: Principal | ICD-10-CM

## 2018-03-29 DIAGNOSIS — I70229 Atherosclerosis of native arteries of extremities with rest pain, unspecified extremity: Secondary | ICD-10-CM

## 2018-03-29 DIAGNOSIS — I70261 Atherosclerosis of native arteries of extremities with gangrene, right leg: Secondary | ICD-10-CM

## 2018-04-01 ENCOUNTER — Other Ambulatory Visit: Payer: Self-pay | Admitting: Sports Medicine

## 2018-04-01 DIAGNOSIS — I10 Essential (primary) hypertension: Secondary | ICD-10-CM

## 2018-04-21 ENCOUNTER — Other Ambulatory Visit: Payer: Self-pay | Admitting: Sports Medicine

## 2018-04-21 DIAGNOSIS — I10 Essential (primary) hypertension: Secondary | ICD-10-CM

## 2018-04-24 ENCOUNTER — Inpatient Hospital Stay (HOSPITAL_COMMUNITY): Admit: 2018-04-24 | Payer: Medicare Other

## 2018-04-24 ENCOUNTER — Other Ambulatory Visit: Payer: Self-pay | Admitting: Sports Medicine

## 2018-04-24 ENCOUNTER — Encounter (HOSPITAL_COMMUNITY): Payer: Medicare Other

## 2018-04-24 DIAGNOSIS — L299 Pruritus, unspecified: Secondary | ICD-10-CM

## 2018-04-25 ENCOUNTER — Encounter (HOSPITAL_COMMUNITY): Payer: Self-pay

## 2018-04-25 ENCOUNTER — Emergency Department (HOSPITAL_COMMUNITY): Payer: Medicare Other

## 2018-04-25 ENCOUNTER — Emergency Department (HOSPITAL_COMMUNITY)
Admission: EM | Admit: 2018-04-25 | Discharge: 2018-04-25 | Disposition: A | Discharge status: Home / Self Care | Payer: Medicare Other | Attending: Emergency Medicine | Admitting: Emergency Medicine

## 2018-04-25 ENCOUNTER — Other Ambulatory Visit: Payer: Self-pay

## 2018-04-25 ENCOUNTER — Encounter: Payer: Medicare Other | Admitting: Vascular Surgery

## 2018-04-25 DIAGNOSIS — I951 Orthostatic hypotension: Secondary | ICD-10-CM | POA: Diagnosis not present

## 2018-04-25 DIAGNOSIS — R42 Dizziness and giddiness: Secondary | ICD-10-CM | POA: Diagnosis present

## 2018-04-25 DIAGNOSIS — I5032 Chronic diastolic (congestive) heart failure: Secondary | ICD-10-CM | POA: Insufficient documentation

## 2018-04-25 DIAGNOSIS — F17210 Nicotine dependence, cigarettes, uncomplicated: Secondary | ICD-10-CM | POA: Insufficient documentation

## 2018-04-25 DIAGNOSIS — Z7901 Long term (current) use of anticoagulants: Secondary | ICD-10-CM | POA: Insufficient documentation

## 2018-04-25 DIAGNOSIS — Z7982 Long term (current) use of aspirin: Secondary | ICD-10-CM | POA: Diagnosis not present

## 2018-04-25 DIAGNOSIS — I130 Hypertensive heart and chronic kidney disease with heart failure and stage 1 through stage 4 chronic kidney disease, or unspecified chronic kidney disease: Secondary | ICD-10-CM | POA: Diagnosis not present

## 2018-04-25 DIAGNOSIS — N184 Chronic kidney disease, stage 4 (severe): Secondary | ICD-10-CM | POA: Insufficient documentation

## 2018-04-25 DIAGNOSIS — Z79899 Other long term (current) drug therapy: Secondary | ICD-10-CM | POA: Diagnosis not present

## 2018-04-25 DIAGNOSIS — W19XXXA Unspecified fall, initial encounter: Secondary | ICD-10-CM

## 2018-04-25 LAB — CBC WITH DIFFERENTIAL/PLATELET
ABS IMMATURE GRANULOCYTES: 0.06 10*3/uL (ref 0.00–0.07)
BASOS PCT: 1 %
Basophils Absolute: 0.1 10*3/uL (ref 0.0–0.1)
Eosinophils Absolute: 0.6 10*3/uL — ABNORMAL HIGH (ref 0.0–0.5)
Eosinophils Relative: 6 %
HCT: 39.1 % (ref 39.0–52.0)
Hemoglobin: 12 g/dL — ABNORMAL LOW (ref 13.0–17.0)
IMMATURE GRANULOCYTES: 1 %
Lymphocytes Relative: 12 %
Lymphs Abs: 1.1 10*3/uL (ref 0.7–4.0)
MCH: 27.8 pg (ref 26.0–34.0)
MCHC: 30.7 g/dL (ref 30.0–36.0)
MCV: 90.7 fL (ref 80.0–100.0)
MONOS PCT: 7 %
Monocytes Absolute: 0.7 10*3/uL (ref 0.1–1.0)
NEUTROS ABS: 6.5 10*3/uL (ref 1.7–7.7)
NEUTROS PCT: 73 %
NRBC: 0 % (ref 0.0–0.2)
PLATELETS: 251 10*3/uL (ref 150–400)
RBC: 4.31 MIL/uL (ref 4.22–5.81)
RDW: 14.3 % (ref 11.5–15.5)
WBC: 9 10*3/uL (ref 4.0–10.5)

## 2018-04-25 LAB — BASIC METABOLIC PANEL
ANION GAP: 10 (ref 5–15)
BUN: 27 mg/dL — AB (ref 8–23)
CO2: 23 mmol/L (ref 22–32)
Calcium: 7.8 mg/dL — ABNORMAL LOW (ref 8.9–10.3)
Chloride: 108 mmol/L (ref 98–111)
Creatinine, Ser: 2.62 mg/dL — ABNORMAL HIGH (ref 0.61–1.24)
GFR calc Af Amer: 26 mL/min — ABNORMAL LOW (ref >60)
GFR, EST NON AFRICAN AMERICAN: 22 mL/min — AB (ref >60)
GLUCOSE: 125 mg/dL — AB (ref 70–99)
POTASSIUM: 5 mmol/L (ref 3.5–5.1)
SODIUM: 141 mmol/L (ref 135–145)

## 2018-04-25 LAB — HEPATIC FUNCTION PANEL
ALBUMIN: 2.3 g/dL — AB (ref 3.5–5.0)
ALT: 22 U/L (ref 0–44)
AST: 27 U/L (ref 15–41)
Alkaline Phosphatase: 114 U/L (ref 38–126)
Bilirubin, Direct: 0.2 mg/dL (ref 0.0–0.2)
Indirect Bilirubin: 0.6 mg/dL (ref 0.3–0.9)
TOTAL PROTEIN: 5.9 g/dL — AB (ref 6.5–8.1)
Total Bilirubin: 0.8 mg/dL (ref 0.3–1.2)

## 2018-04-25 LAB — URINALYSIS, ROUTINE W REFLEX MICROSCOPIC
BILIRUBIN URINE: NEGATIVE
Bacteria, UA: NONE SEEN
Glucose, UA: NEGATIVE mg/dL
Hgb urine dipstick: NEGATIVE
KETONES UR: NEGATIVE mg/dL
Leukocytes, UA: NEGATIVE
Nitrite: NEGATIVE
PROTEIN: 30 mg/dL — AB
Specific Gravity, Urine: 1.023 (ref 1.005–1.030)
pH: 5 (ref 5.0–8.0)

## 2018-04-25 MED ORDER — SODIUM CHLORIDE 0.9 % IV BOLUS
1000.0000 mL | Freq: Once | INTRAVENOUS | Status: AC
  Administered 2018-04-25: 1000 mL via INTRAVENOUS

## 2018-04-25 NOTE — ED Notes (Signed)
EKG given to EDP,Wentz,MD., for review. 

## 2018-04-25 NOTE — ED Triage Notes (Signed)
Pt arrives via EMS from home. Per EMS pt reports a fall today. Pt denies pain anywhere at this time. Pt reports dizziness that has been ongoing and intermittent for 1 year. Pt reports increased weakness for 1 week. Pt also reports generalized itching for weeks. Per EMS: Pt had positive orthostatic changes. Pts lying BP 160/80, HR-86, Sitting BP- 114/68, HR-86

## 2018-04-25 NOTE — ED Provider Notes (Signed)
[facility]-EMERGENCY DEPT Provider Note   CSN: [id number] Arrival date & time: 04/25/18  1302   History   Chief Complaint Chief Complaint  Patient presents with  . Fall  . Dizziness  . Weakness    HPI Londen Bok. is a 77 y.o. 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  Patient states he went to get up off the toilet this morning and when he stood up he felt lightheaded and dizzy and fell down landing on his buttocks and bilateral elbows.  Patient states his elbows caught his fall.  Patient states he has had similar episodes in the past over the last year.  Patient states these episodes happen when he goes from laying to sitting or sitting to standing.  Patient states his primary told him he needed to sit on the edge of the bed before he started walking.  Patient states he was recently put on hydroxyzine for skin pruritus.  Patient states he was supposed to be followed up by dermatologist, however never received the referral and has not been.  Patient states he has been sleepy since he started this medication which she describes as "weakness."  Denies focal weakness, slurred speech, facial asymmetry. Patient denies hitting head or loss of consciousness.  States he is able to get up and ambulate after the incident.  Denies midline neck or back pain, decreased range of motion in extremities, denies hip pain, headache, vision changes, speech difficulty, tremors, nausea,, chest pain, shortness of breath, abdominal pain, dysuria. Patient states he has had difficulty walking since his surgery secondary to his peripheral vascular disease.  Patient denies chronic pain to his lower extremities and paresthesias.  Patient states this is at baseline and has not changed since his fall.  Patient states he is able to  move extremities without difficulty.  Patient's daughter is in the room and she states patient has history of prostate cancer and was recently diagnosed with stage IV CKD.  History obtained from patient and daughter.  No interpreter was used.  HPI  Past Medical History:  Diagnosis Date  . Carotid artery occlusion   . Colon polyps   . Degenerative arthritis of spine   . Diverticulosis   . Hemorrhoids   . Hemorrhoids   . Hyperlipidemia   . Hypertension    2010  . Ischemia of foot 08/2017  . PVD (peripheral vascular disease) (HCC)   . Sciatica   . Solar keratosis   . Vitamin B12 deficiency     Patient Active Problem List   Diagnosis Date Noted  . PVD (peripheral vascular disease) (HCC) 03/15/2018  . Itching 10/28/2017  . Postoperative seroma of subcutaneous tissue after non-dermatologic procedure 10/12/2017  . Onychodystrophy 10/12/2017  . 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 Gangrene of right foot (HCC) 08/24/2017  . 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 Acute renal failure superimposed on stage 3 chronic kidney disease (HCC)   . PAD (peripheral artery disease) (HCC) 08/23/2017  . Peripheral vascular disease (HCC)   . Preoperative clearance   . ARF (acute renal failure) (HCC) 08/16/2017  . Ischemic foot 08/15/2017  . Infection of right foot 08/09/2017  . Depressed mood 08/09/2017  . Allergic conjunctivitis 08/23/2016  . 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  Abnormality of gait 01/27/2015  . Chronic diastolic heart failure (HCC) 01/02/2015  . Preventive measure 01/05/2013  . Occlusion and stenosis of carotid artery without mention of cerebral infarction 11/12/2011  . Carotid stenosis 04/16/2011  . Lumbar degenerative disc disease 08/03/2010  . HEMORRHOIDS 03/11/2009  . Hyperlipidemia 01/29/2009  . CIGARETTE SMOKER 10/04/2008  . Essential  hypertension, benign 10/04/2008    Past Surgical History:  Procedure Laterality Date  . ABDOMINAL AORTOGRAM W/LOWER EXTREMITY N/A 08/18/2017   Procedure: ABDOMINAL AORTOGRAM W/LOWER EXTREMITY;  Surgeon: [name], MD;  Location: [facility] INVASIVE CV LAB;  Service: Cardiovascular;  Laterality: N/A;  rt .lower extermity  . ABDOMINAL AORTOGRAM W/LOWER EXTREMITY N/A 03/15/2018   Procedure: ABDOMINAL AORTOGRAM W/LOWER EXTREMITY;  Surgeon: Cephus Shelling, MD;  Location: [facility] INVASIVE CV LAB;  Service: Cardiovascular;  Laterality: N/A;  . AMPUTATION Right 08/23/2017   Procedure: AMPUTATION FOURTH AND FIFTH TOES RIGHT FOOT, Debridement of Right Heal and third toe.;  Surgeon: [name], MD;  Location: [facility] OR;  Service: Vascular;  Laterality: Right;  . BYPASS GRAFT POPLITEAL TO POPLITEAL Right 08/23/2017   Procedure: BYPASS GRAFT ABOVE THE KNEE POPLITEAL TO BELOW THE KNEE POPLITEAL RIGHT USING PROPATEN GORE VASCULAR GRAFT X ;  Surgeon: [name], MD;  Location: [facility] OR;  Service: Vascular;  Laterality: Right;  . CAROTID ENDARTERECTOMY  09/21/10   Right CEA  . NECK MASS EXCISION    . PERIPHERAL VASCULAR INTERVENTION Right 03/15/2018   Procedure: PERIPHERAL VASCULAR INTERVENTION;  Surgeon: Cephus Shelling, MD;  Location[facility] INVASIVE CV LAB;  Service: Cardiovascular;  Laterality: Right;        Home Medications    Prior to Admission medications   Medication Sig Start Date End Date Taking? Authorizing Provider  acetaminophen (TYLENOL) 500 MG tablet Take 1,000 mg by mouth daily as needed for moderate pain.    Yes [provider]  amLODipine (NORVASC) 5 MG tablet TAKE 1 TABLET(5 MG) BY MOUTH DAILY Patient taking differently: Take 5 mg by mouth daily.  03/26/18  Yes Monica Becton, MD  Carboxymethylcellul-Glycerin (LUBRICATING EYE DROPS OP) Place 1 drop into both eyes daily as needed (irritation).   Yes [provider]  clopidogrel (PLAVIX) 75 MG tablet Take 1  tablet (75 mg total) by mouth daily with breakfast. 03/16/18  Yes Rhyne, Samantha J, PA-C  diphenhydrAMINE (BENADRYL) 25 MG tablet Take 25 mg by mouth daily as needed for allergies.   Yes [provider]  DULoxetine (CYMBALTA) 30 MG capsule TAKE 1 CAPSULE(30 MG) BY MOUTH DAILY Patient taking differently: Take 30 mg by mouth daily at 12 noon.  01/26/18  Yes Monica Becton, MD  ferrous sulfate 325 (65 FE) MG EC tablet Take 1 tablet (325 mg total) by mouth 3 (three) times daily with meals. Patient taking differently: Take 325 mg by mouth daily at 12 noon.  02/17/15  Yes Monica Becton, MD  folic acid (FOLVITE) 1 MG tablet Take 5 tablets (5 mg total) by mouth daily. Patient taking differently: Take 5 mg by mouth daily at 12 noon.  02/21/15  Yes Monica Becton, MD  gabapentin (NEURONTIN) 100 MG capsule TAKE 1 CAPSULE(100 MG) BY MOUTH TWICE DAILY AS NEEDED FOR ITCHING Patient taking differently: Take 100 mg by mouth 3 (three) times daily as needed (itching).  04/24/18  Yes Monica Becton, MD  hydrOXYzine (ATARAX/VISTARIL) 50 MG tablet TAKE 2 TABLETS(100 MG) BY MOUTH THREE TIMES DAILY Patient taking differently: Take 100  mg by mouth 3 (three) times daily as needed for itching.  04/21/18  Yes Monica Becton, MD  metoprolol succinate (TOPROL-XL) 25 MG 24 hr tablet TAKE 1 TABLET(25 MG) BY MOUTH DAILY Patient taking differently: Take 25 mg by mouth daily at 12 noon.  02/12/18  Yes Monica Becton, MD  Skin Protectants, Misc. (EUCERIN) cream Apply topically 2 (two) times daily. Patient taking differently: Apply 1 application topically as needed (itching).  11/14/17  Yes Monica Becton, MD  vitamin B-12 (CYANOCOBALAMIN) 1000 MCG tablet Take 1 tablet (1,000 mcg total) by mouth daily. Patient taking differently: Take 1,000 mcg by mouth daily at 12 noon.  06/16/15  Yes Monica Becton, MD  AMBULATORY NON FORMULARY MEDICATION Knee-high, medium  compression, graduated compression stockings. Apply to lower extremities. [email or web address], Zippered Compression Stockings, medium circ, long length 01/27/15   Monica Becton, MD  aspirin EC 81 MG EC tablet Take 1 tablet (81 mg total) by mouth daily. Patient not taking: Reported on 04/25/2018 03/16/18   Dara Lords, PA-C  hydrOXYzine (ATARAX/VISTARIL) 50 MG tablet Take 1 tablet (50 mg total) by mouth 3 (three) times daily. Patient not taking: Reported on 04/25/2018 03/16/18   Dara Lords, PA-C    Family History Family History  Problem Relation Age of Onset  . Dementia Mother   . Heart attack Father        First MI age 36.  Marland Kitchen Heart disease Father 69  . Heart attack Brother        CAD, prior MI  . Hypertension Brother   . ALS Sister     Social History Social History   Tobacco Use  . Smoking status: Current Every Day Smoker    Packs/day: 1.00    Years: 50.00    Pack years: 50.00    Types: Cigarettes  . Smokeless tobacco: Never Used  Substance Use Topics  . Alcohol use: Yes    Alcohol/week: 0.0 standard drinks    Comment: Patient drinks 3 beers daily  . Drug use: No     Allergies   Pollen extract   Review of Systems Review of Systems  Constitutional: Negative.   HENT: Negative.   Eyes: Negative for photophobia and visual disturbance.  Respiratory: Negative.   Cardiovascular: Negative.   Gastrointestinal: Negative.   Genitourinary: Negative.   Musculoskeletal: Negative.   Skin: Positive for rash.  Neurological: Negative for dizziness and facial asymmetry.       Intermittent dizziness when going from sitting to standing.  All other systems reviewed and are negative.    Physical Exam Updated Vital Signs BP (!) 166/85   Pulse 90   Temp 98.9 F (37.2 C) (Oral)   Resp (!) 22   Ht 5\' 11"  (1.803 m)   Wt 88.5 kg   SpO2 97%   BMI 27.20 kg/m   Physical Exam  Constitutional: He appears well-developed and well-nourished.  Non-toxic  appearance. He does not have a sickly appearance. He does not appear ill. No distress.  HENT:  Head: Normocephalic and atraumatic. Head is without raccoon's eyes, without Battle's sign, without abrasion, without contusion, without laceration, without right periorbital erythema and without left periorbital erythema. Hair is normal.  Nose: Nose normal. Right sinus exhibits no maxillary sinus tenderness and no frontal sinus tenderness. Left sinus exhibits no maxillary sinus tenderness and no frontal sinus tenderness.  Mouth/Throat: Uvula is midline, oropharynx is clear and moist and mucous membranes are normal. No oropharyngeal exudate, posterior oropharyngeal  edema, posterior oropharyngeal erythema or tonsillar abscesses.  Eyes: Pupils are equal, round, and reactive to light. EOM are normal.  Neck: Normal range of motion and full passive range of motion without pain. Neck supple. No spinous process tenderness and no muscular tenderness present. No neck rigidity. No edema, no erythema and normal range of motion present.  Cardiovascular: Normal rate, regular rhythm, normal heart sounds and normal pulses. Exam reveals no gallop and no friction rub.  Pulmonary/Chest: Effort normal and breath sounds normal. No stridor. No tachypnea. No respiratory distress. He has no decreased breath sounds. He has no wheezes. He has no rhonchi. He has no rales. He exhibits no tenderness.  Abdominal: Soft. Normal appearance and bowel sounds are normal. He exhibits no shifting dullness, no distension, no fluid wave, no abdominal bruit and no mass. There is no tenderness. There is no rigidity, no rebound, no guarding and no CVA tenderness.  Musculoskeletal: Normal range of motion.       Right shoulder: Normal.       Left shoulder: Normal.       Right elbow: He exhibits normal range of motion, no swelling, no effusion, no deformity and no laceration. Tenderness found.       Left elbow: He exhibits normal range of motion, no  swelling, no effusion, no deformity and no laceration. Tenderness found.       Right wrist: Normal.       Left wrist: Normal.       Right hip: Normal.       Left hip: Normal.       Right knee: Normal.       Left knee: Normal.       Cervical back: Normal.       Thoracic back: Normal.       Lumbar back: Normal.       Right foot: There is deformity. There is normal range of motion.       Left foot: There is normal range of motion and no deformity.  No tenderness to palpation to midline spine, or bilateral lower extremities.  No tenderness palpation to hips or pelvis.  Mild tenderness palpation to bilateral elbows.  No tenderness palpation to bilateral shoulders, radius, ulna, wrist or humerus.  Full range of motion bilateral upper and lower extremities.  5/5 strength bilateral upper and lower extremities.  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  Feet:  Right Foot:  Skin Integrity: Positive for dry skin. Negative for ulcer, blister, skin breakdown, erythema or warmth.  Left Foot:  Skin Integrity: Positive for dry skin. Negative for ulcer, blister, skin breakdown, erythema or warmth.  Neurological: He is alert.  Sensation to bilateral upper and lower extremity.  Skin: Skin is warm and dry. He is not diaphoretic.  Patient has diffuse erythema and extremely dry scaling skin to entire body.  Patient has multiple areas of excoriation from pruritus.  Psychiatric: He has a normal mood and affect.  Nursing note and vitals reviewed.    ED Treatments / Results  Labs (all labs ordered are listed, but only abnormal results are displayed) Labs Reviewed  CBC WITH DIFFERENTIAL/PLATELET - Abnormal; Notable for the following components:      Result Value   Hemoglobin 12.0 (*)    Eosinophils Absolute 0.6 (*)    All other components within normal limits  BASIC METABOLIC PANEL - Abnormal; Notable for the following components:   Glucose, Bld 125 (*)    BUN 27  (*)  Creatinine, Ser 2.62 (*)    Calcium 7.8 (*)    GFR calc non Af Amer 22 (*)    GFR calc Af Amer 26 (*)    All other components within normal limits  URINALYSIS, ROUTINE W REFLEX MICROSCOPIC - Abnormal; Notable for the following components:   Protein, ur 30 (*)    All other components within normal limits  HEPATIC FUNCTION PANEL - Abnormal; Notable for the following components:   Total Protein 5.9 (*)    Albumin 2.3 (*)    All other components within normal limits    EKG EKG Interpretation  Date/Time:  Tuesday April 25 2018 19:43:25 EST Ventricular Rate:  90 PR Interval:    QRS Duration: 119 QT Interval:  377 QTC Calculation: 462 R Axis:   77 Text Interpretation:  Sinus or ectopic atrial rhythm Ventricular premature complex Incomplete right bundle branch block Artifact since last tracing no significant change Confirmed by Mancel Bale [phone]) on 04/25/2018 8:20:06 PM   Radiology Dg Elbow Complete Left  Result Date: 04/25/2018 CLINICAL DATA:  Fall.  Pain. EXAM: LEFT ELBOW - COMPLETE 3+ VIEW COMPARISON:  No recent. FINDINGS: No acute bony or joint abnormality identified. No evidence of fracture or dislocation. IMPRESSION: No acute abnormality. Electronically Signed   By: Maisie Fus  Register   On: 04/25/2018 15:37   Dg Elbow Complete Right  Result Date: 04/25/2018 CLINICAL DATA:  Larey Seat with pain in the elbow, dizziness EXAM: RIGHT ELBOW - COMPLETE 3+ VIEW COMPARISON:  None. FINDINGS: No acute fracture is seen. Alignment is normal. Only mild degenerative spurring is present. No joint effusion is seen. IMPRESSION: Mild degenerative change.  No acute abnormality. Electronically Signed   By: Dwyane Dee M.D.   On: 04/25/2018 15:37    Procedures Procedures (including critical care time)  Medications Ordered in ED Medications  sodium chloride 0.9 % bolus 1,000 mL (0 mLs Intravenous Stopped 04/25/18 1755)     Initial Impression / Assessment and Plan / ED Course  I have  reviewed the triage vital signs and the nursing notes.  Pertinent labs & imaging results that were available during my care of the patient were reviewed by me and considered in my medical decision making (see chart for details).  77 year old male who appears chronically ill presents for evaluation after mechanical fall.  Denies hitting head or loss of consciousness.  Patient states his socks were slippery when he went to stand up after getting off the toilet this morning he slipped and fell.  Patient's daughter who is also in the room states that patient has had intermittent dizzy episodes over the last year when he goes from sitting to standing or lying to sitting.  Patient states he has addressed this with his PCP and they told him to sit on the edge of the bed.  Patient states he does have a pruritic rash that he has had for "months."  Patient states he was started on hydroxyzine and was told to follow-up with a dermatologist however a referral was not placed and he has not been seen.  Normal neurologic exam without neurologic deficits.  Patient moves all extremities without difficulty.  No midline neck or midline back pain.  He does have mild tenderness to bilateral elbows, however has normal musculoskeletal exam.  Will obtain plain films to rule out fracture dislocation of elbows.  Patient does have bilateral venous stasis skin changes to bilateral legs.  He does have 2 amputated metatarsals on the right lower extremity, fourth  and fifth digit.  States he has had pain and weakness to bilateral lower extremities, however this is been chronic in nature. Normal neurologic exam without neurologic deficits. Will obtain labs, fluids, plain films and reevaluate.  On reevalaution without complaints.  His plain films were negative for fracture dislocation of his elbows.  Most likely musculoskeletal strain.  Metabolic panel with elevation in creatinine to 2.62, patient's creatinine at 2.52 on 04/14/18.  Metabolic  panel with albumin at 2.3, this appears chronic in nature according to his past medical history.  Urinalysis without evidence of infection. EKG with sinus atrial rhythm, artifact present.   Patient has positive orthostatic vital signs. He did receive 1L fluids in department. Thorough discussion with patient and family for admission for continual IV hydration given orthostasis.  Patient is adamant that he is not staying.  Denies dizziness or lightheadedness with standing and ambulation. Discussed with patient and family risk versus benefits of admission.  Patient voiced understanding, however is continues to be adamant that he wants to go home.  Discussed with daughter who is in room.  Daughter says she does not "want to fight him on this."  Daughter states that she is "okay with him going home." Discussed with them that it is my recommendation that patient be admitted however if patient has close outpatient follow-up and has help at home he can be discharged. Discussed oral rehydration as well as precautions for standing to prevent dizziness.  Discussed strict return precautions as well as close outpatient follow-up.  Patient lab work appears to be at baseline for patient according to his last labs drawn approximately 11 days ago.  Patient is hemodynamically stable and appropriate for DC home at this time.  Patient and family voiced understanding of return precautions.  All questions were answered. Patient is agreeable for follow-up.  Patient seen and evaluated by my attending, Dr. Patria Mane agrees with above treatment, plan disposition of patient. Clinical Course as of Apr 25 2050  Tue Apr 25, 2018  2032 No evidence of infection  Urinalysis, Routine w reflex microscopic(!) [BH]  2036 Creatinine at baseline, 2.52 11 days ago  Creatinine(!): 2.62 [BH]  2036 Hemoglobin at baseline, no leukocytosis  CBC with Differential(!) [BH]  2036 Negative  DG Elbow Complete Left [BH]  2036 Negative for fracture or  dislocation  DG Elbow Complete Right [BH]  2036 Sinus atrial rhythm, artifact present. No St elevation  EKG 12-Lead [BH]    Clinical Course User Index [BH] Henderly, Britni A, PA-C    Final Clinical Impressions(s) / ED Diagnoses   Final diagnoses:  Fall, initial encounter  Orthostatic dizziness    ED Discharge Orders    None       Henderly, Britni A, PA-C 04/25/18 2051    Azalia Bilis, MD 04/26/18 1553

## 2018-04-25 NOTE — Discharge Instructions (Addendum)
Evaluated today after fall.  Your scans were negative.  Your blood work did show that you have had a decrease in your kidney function.  You did have blood pressures consistent with orthostatic hypotension.  You stated you did not want to stay in the hospital.  I would suggest oral rehydration at home with liquids.  Also suggest sitting on edge of bed and not moving quickly.  Please follow-up with your PCP within the next 2 days.  Return to the ED for any worsening symptoms.

## 2018-04-25 NOTE — ED Notes (Signed)
Pt is alert and oriented x 4 and is verbally responsive. Pt reports that he fell getting up from the toilet this morning, pt reports that his socks were slippery. Pt also reports that he has had ongoing dizziness and has been having some vomiting the past few day.

## 2018-04-25 NOTE — ED Notes (Signed)
Patient transported to X-ray 

## 2018-04-25 NOTE — ED Notes (Signed)
Bed: WA02 Expected date:  Expected time:  Means of arrival:  Comments: EMS- 77yo M, fall/orthostatic

## 2018-05-09 ENCOUNTER — Emergency Department (HOSPITAL_COMMUNITY)
Admission: EM | Admit: 2018-05-09 | Discharge: 2018-06-07 | Disposition: E | Payer: Medicare Other | Attending: Emergency Medicine | Admitting: Emergency Medicine

## 2018-05-09 ENCOUNTER — Emergency Department (HOSPITAL_COMMUNITY): Payer: Medicare Other

## 2018-05-09 ENCOUNTER — Other Ambulatory Visit: Payer: Self-pay

## 2018-05-09 ENCOUNTER — Encounter (HOSPITAL_COMMUNITY): Admission: EM | Disposition: E | Payer: Self-pay | Source: Home / Self Care | Attending: Emergency Medicine

## 2018-05-09 ENCOUNTER — Encounter (HOSPITAL_COMMUNITY): Payer: Self-pay | Admitting: *Deleted

## 2018-05-09 DIAGNOSIS — I5032 Chronic diastolic (congestive) heart failure: Secondary | ICD-10-CM | POA: Diagnosis not present

## 2018-05-09 DIAGNOSIS — R001 Bradycardia, unspecified: Secondary | ICD-10-CM | POA: Diagnosis not present

## 2018-05-09 DIAGNOSIS — F17210 Nicotine dependence, cigarettes, uncomplicated: Secondary | ICD-10-CM | POA: Diagnosis not present

## 2018-05-09 DIAGNOSIS — I469 Cardiac arrest, cause unspecified: Secondary | ICD-10-CM | POA: Insufficient documentation

## 2018-05-09 DIAGNOSIS — I110 Hypertensive heart disease with heart failure: Secondary | ICD-10-CM | POA: Insufficient documentation

## 2018-05-09 DIAGNOSIS — R55 Syncope and collapse: Secondary | ICD-10-CM | POA: Diagnosis present

## 2018-05-09 DIAGNOSIS — E872 Acidosis: Secondary | ICD-10-CM

## 2018-05-09 LAB — COMPREHENSIVE METABOLIC PANEL
ALT: 219 U/L — AB (ref 0–44)
ANION GAP: 24 — AB (ref 5–15)
AST: 661 U/L — ABNORMAL HIGH (ref 15–41)
Albumin: 1.9 g/dL — ABNORMAL LOW (ref 3.5–5.0)
Alkaline Phosphatase: 188 U/L — ABNORMAL HIGH (ref 38–126)
BUN: 27 mg/dL — ABNORMAL HIGH (ref 8–23)
CHLORIDE: 105 mmol/L (ref 98–111)
CO2: 12 mmol/L — AB (ref 22–32)
Calcium: 8 mg/dL — ABNORMAL LOW (ref 8.9–10.3)
Creatinine, Ser: 4.19 mg/dL — ABNORMAL HIGH (ref 0.61–1.24)
GFR calc non Af Amer: 13 mL/min — ABNORMAL LOW (ref >60)
GFR, EST AFRICAN AMERICAN: 15 mL/min — AB (ref >60)
Glucose, Bld: 133 mg/dL — ABNORMAL HIGH (ref 70–99)
Potassium: 5 mmol/L (ref 3.5–5.1)
SODIUM: 141 mmol/L (ref 135–145)
Total Bilirubin: 1.2 mg/dL (ref 0.3–1.2)
Total Protein: 5.7 g/dL — ABNORMAL LOW (ref 6.5–8.1)

## 2018-05-09 LAB — CBC WITH DIFFERENTIAL/PLATELET
Abs Immature Granulocytes: 0.16 10*3/uL — ABNORMAL HIGH (ref 0.00–0.07)
BASOS ABS: 0 10*3/uL (ref 0.0–0.1)
Basophils Relative: 0 %
Eosinophils Absolute: 0.1 10*3/uL (ref 0.0–0.5)
Eosinophils Relative: 1 %
HCT: 39.4 % (ref 39.0–52.0)
HEMOGLOBIN: 10.9 g/dL — AB (ref 13.0–17.0)
IMMATURE GRANULOCYTES: 2 %
LYMPHS ABS: 2.2 10*3/uL (ref 0.7–4.0)
LYMPHS PCT: 23 %
MCH: 27.3 pg (ref 26.0–34.0)
MCHC: 27.7 g/dL — ABNORMAL LOW (ref 30.0–36.0)
MCV: 98.5 fL (ref 80.0–100.0)
Monocytes Absolute: 0.6 10*3/uL (ref 0.1–1.0)
Monocytes Relative: 7 %
NEUTROS PCT: 67 %
NRBC: 0 % (ref 0.0–0.2)
Neutro Abs: 6.6 10*3/uL (ref 1.7–7.7)
Platelets: 155 10*3/uL (ref 150–400)
RBC: 4 MIL/uL — ABNORMAL LOW (ref 4.22–5.81)
RDW: 14.2 % (ref 11.5–15.5)
WBC: 9.7 10*3/uL (ref 4.0–10.5)

## 2018-05-09 LAB — I-STAT VENOUS BLOOD GAS, ED
Acid-base deficit: 17 mmol/L — ABNORMAL HIGH (ref 0.0–2.0)
Bicarbonate: 15.4 mmol/L — ABNORMAL LOW (ref 20.0–28.0)
O2 Saturation: 42 %
PCO2 VEN: 65.8 mmHg — AB (ref 44.0–60.0)
TCO2: 17 mmol/L — ABNORMAL LOW (ref 22–32)
pH, Ven: 6.976 — CL (ref 7.250–7.430)
pO2, Ven: 37 mmHg (ref 32.0–45.0)

## 2018-05-09 LAB — PROTIME-INR
INR: 1.37
Prothrombin Time: 16.7 seconds — ABNORMAL HIGH (ref 11.4–15.2)

## 2018-05-09 LAB — I-STAT CHEM 8, ED
BUN: 29 mg/dL — AB (ref 8–23)
CALCIUM ION: 0.87 mmol/L — AB (ref 1.15–1.40)
CREATININE: 3.5 mg/dL — AB (ref 0.61–1.24)
Chloride: 112 mmol/L — ABNORMAL HIGH (ref 98–111)
Glucose, Bld: 127 mg/dL — ABNORMAL HIGH (ref 70–99)
HCT: 36 % — ABNORMAL LOW (ref 39.0–52.0)
Hemoglobin: 12.2 g/dL — ABNORMAL LOW (ref 13.0–17.0)
Potassium: 4.7 mmol/L (ref 3.5–5.1)
Sodium: 138 mmol/L (ref 135–145)
TCO2: 12 mmol/L — AB (ref 22–32)

## 2018-05-09 LAB — I-STAT TROPONIN, ED: TROPONIN I, POC: 17.69 ng/mL — AB (ref 0.00–0.08)

## 2018-05-09 LAB — I-STAT CG4 LACTIC ACID, ED: Lactic Acid, Venous: 13.97 mmol/L (ref 0.5–1.9)

## 2018-05-09 LAB — MAGNESIUM: Magnesium: 2.3 mg/dL (ref 1.7–2.4)

## 2018-05-09 SURGERY — TEMPORARY PACEMAKER
Anesthesia: LOCAL

## 2018-05-09 MED ORDER — NOREPINEPHRINE 4 MG/250ML-% IV SOLN: 0.0000 ug/min | INTRAVENOUS | Status: DC

## 2018-05-09 MED ORDER — MORPHINE SULFATE (PF) 4 MG/ML IV SOLN
6.0000 mg | Freq: Once | INTRAVENOUS | Status: DC
  Filled 2018-05-09: qty 2

## 2018-05-09 MED ORDER — SODIUM CHLORIDE 0.9 % IV BOLUS: 1000.0000 mL | Freq: Once | INTRAVENOUS | Status: DC

## 2018-05-09 MED ORDER — MORPHINE 100MG IN NS 100ML (1MG/ML) PREMIX INFUSION
5.0000 mg/h | INTRAVENOUS | Status: DC
  Filled 2018-05-09: qty 100

## 2018-05-09 MED ORDER — MORPHINE SULFATE (PF) 4 MG/ML IV SOLN
6.0000 mg | Freq: Once | INTRAVENOUS | Status: AC
  Administered 2018-05-09: 6 mg via INTRAVENOUS

## 2018-05-09 MED ORDER — FENTANYL 2500MCG IN NS 250ML (10MCG/ML) PREMIX INFUSION
0.0000 ug/h | INTRAVENOUS | Status: DC
  Filled 2018-05-09: qty 250

## 2018-05-09 MED ORDER — ETOMIDATE 2 MG/ML IV SOLN
INTRAVENOUS | Status: AC | PRN
  Administered 2018-05-09: 20 mg via INTRAVENOUS

## 2018-05-09 MED ORDER — NOREPINEPHRINE 4 MG/250ML-% IV SOLN
INTRAVENOUS | Status: AC
  Filled 2018-05-09: qty 250

## 2018-05-09 MED ORDER — ROCURONIUM BROMIDE 50 MG/5ML IV SOLN
INTRAVENOUS | Status: AC | PRN
  Administered 2018-05-09: 100 mg via INTRAVENOUS

## 2018-05-09 MED ORDER — FENTANYL 2500MCG IN NS 250ML (10MCG/ML) PREMIX INFUSION: 40.0000 ug/h | INTRAVENOUS | Status: DC

## 2018-05-20 ENCOUNTER — Other Ambulatory Visit: Payer: Self-pay | Admitting: Sports Medicine

## 2018-05-20 DIAGNOSIS — I10 Essential (primary) hypertension: Secondary | ICD-10-CM

## 2018-06-07 NOTE — Progress Notes (Signed)
RT NOTE: RT paged for patient, post cardiac arrest. Patient arrived via EMS with king airway in place. RT assisted MD in reintubating patient with 8.0 ETT. Patient placed on ventilator settings as follows: VT 600 (8cc's), RR 16, 100% FIO2, and PEEP of 5. Charge RT is preparing to transport patient to cath lab. Vitals are stable. RT will continue to monitor.

## 2018-06-07 NOTE — ED Notes (Signed)
Family at bedside. 

## 2018-06-07 NOTE — ED Notes (Signed)
Levophed decreased to 5 mcg

## 2018-06-07 NOTE — Progress Notes (Signed)
RT NOTE: Patient not going to cath lab per daughter. Family coming in to see patient. Vitals are stable. RT will continue to monitor.

## 2018-06-07 NOTE — ED Triage Notes (Signed)
Patient presents to ed via [facility] patient called his sister and said he was sob, sister saw him collapse and she started cpr, FD arrived very shortly applied AED no shock ems arrived patient was in asystole gave 3 epip return of ROSC place patient in the ems truck patient arrested asystole 4 mins of CPR return of ROSC. Upon arrival patient was being paced rate 60 ma of 200.

## 2018-06-07 NOTE — ED Notes (Signed)
Dr. Jacqulyn BathLong at bedside spoke with family

## 2018-06-07 NOTE — ED Notes (Signed)
Family remains at bedside. Comfort care given to family

## 2018-06-07 NOTE — ED Notes (Signed)
No pulse no resp effort Dr. Jacqulyn BathLong at bedside                                                                                                                                                   Patient  Pronounced dead

## 2018-06-07 NOTE — Consult Note (Addendum)
Cardiology Consultation:   Patient ID: Brandon Boresroy Arrowood Jr. MRN: [id number]; DOB: 03/01/1941  Admit date: 05/31/2018 Date of Consult: 05/29/2018  Primary Care Provider: [name], Thomas J, MD Primary Cardiologist: Verne Carrowhristopher McAlhany, MD  Patient Profile:   Brandon Boresroy Bickle Jr. is a 78 y.o. male with a hx of PAD, carotid disease s/p R CEA 2012, HTN, HLD  who is being seen today for the evaluation of cardiac arrest at the request of Dr. Jacqulyn BathLong.  Patient has remote cardiac catheterization by Dr. Clifton JamesMcAlhany in 2012 showing mild nonobstructive CAD.  Patient was last seen 08/2017 for preoperative clearance for lower extremity bypass and right digit amputation.  Patient had low risk stress test and normal LV function by echocardiogram for preoperative clearance.  During admission, patient was noted bradycardic at 40 to 50s while awake>> improved with reducing Toprol-XL 25 mg daily.    Significant peripheral vascular disease which followed by Dr. Imogene Burnhen. R AK to BK pop BPG, ray amp R 4th and 5th toes, debridement of R 3rd toe ulcer(08/23/17).   History of Present Illness:   Mr. [name] presented by EMS after cardiac arrest.  History provided by sister, daughter who works as Chief Technology Officerower of Attorney and significant other.  Patient does not have any living will.  Patient has not feeling well for past few weeks.  Mostly bedridden.  This morning, patient has called her sister stating that not feeling well and weak.  Sister noted swallow breathing upon arrival and then patient had sudden collapse.  She started CPR for about 5 minutes prior to EMS arrival.  Noted asystole, return of spontaneous circulation after 3 epi.  Patient again had asystole arrest during transportation requiring 4 minutes of CPR with return of ROSC.  In ER, at baseline patient is bradycardic in 10 to 20s, requiring external pacing.  Labs show normal electrolyte.  Serum creatinine elevated at 3.5.  Point-of-care troponin 17.69.  Lactic acid 14.  Hemoglobin  12.  Per daughter, patient does not feeling well for past few months but recently declined significantly.  Past Medical History:  Diagnosis Date  . Carotid artery occlusion   . Colon polyps   . Degenerative arthritis of spine   . Diverticulosis   . Hemorrhoids   . Hemorrhoids   . Hyperlipidemia   . Hypertension    2010  . Ischemia of foot 08/2017  . PVD (peripheral vascular disease) (HCC)   . Sciatica   . Solar keratosis   . Vitamin B12 deficiency     Past Surgical History:  Procedure Laterality Date  . ABDOMINAL AORTOGRAM W/LOWER EXTREMITY N/A 08/18/2017   Procedure: ABDOMINAL AORTOGRAM W/LOWER EXTREMITY;  Surgeon: Fransisco Hertzhen, Brian L, MD;  [facility];  Service: Cardiovascular;  Laterality: N/A;  rt .lower extermity  . ABDOMINAL AORTOGRAM W/LOWER EXTREMITY N/A 03/15/2018   Procedure: ABDOMINAL AORTOGRAM W/LOWER EXTREMITY;  Surgeon: Cephus Shellinglark, Christopher J, MD;  Location: [facility] INVASIVE CV LAB;  Service: Cardiovascular;  Laterality: N/A;  . AMPUTATION Right 08/23/2017   Procedure: AMPUTATION FOURTH AND FIFTH TOES RIGHT FOOT, Debridement of Right Heal and third toe.;  Surgeon: Fransisco Hertzhen, Brian L, MD;  [facility];  Service: Vascular;  Laterality: Right;  . BYPASS GRAFT POPLITEAL TO POPLITEAL Right 08/23/2017   Procedure: BYPASS GRAFT ABOVE THE KNEE POPLITEAL TO BELOW THE KNEE POPLITEAL RIGHT USING PROPATEN GORE VASCULAR GRAFT 6MM X 40MM;  Surgeon: Fransisco Hertzhen, Brian L, MD;  [facility];  Service: Vascular;  Laterality: Right;  . CAROTID ENDARTERECTOMY  09/21/10  Right CEA  . NECK MASS EXCISION    . PERIPHERAL VASCULAR INTERVENTION Right 03/15/2018   Procedure: PERIPHERAL VASCULAR INTERVENTION;  Surgeon: Cephus Shelling, MD;  Location[facility] IN[facility] CV LAB;  Service: Cardiovascular;  Laterality: Right;     Inpatient Medications: Scheduled Meds:  Continuous Infusions: . fentaNYL infusion INTRAVENOUS    . fentaNYL infusion INTRAVENOUS    . sodium chloride     PRN  Meds: etomidate, rocuronium  Allergies:    Allergies  Allergen Reactions  . Pollen Extract     Social History:   Social History   Socioeconomic History  . Marital status: Widowed    Spouse name: Not on file  . Number of children: 2  . Years of education: HS  . Highest education level: Not on file  Occupational History  . Occupation: retired  Engineer, production  . Financial resource strain: Not on file  . Food insecurity:    Worry: Not on file    Inability: Not on file  . Transportation needs:    Medical: Not on file    Non-medical: Not on file  Tobacco Use  . Smoking status: Current Every Day Smoker    Packs/day: 1.00    Years: 50.00    Pack years: 50.00    Types: Cigarettes  . Smokeless tobacco: Never Used  Substance and Sexual Activity  . Alcohol use: Yes    Alcohol/week: 0.0 standard drinks    Comment: Patient drinks 3 beers daily  . Drug use: No  . Sexual activity: Not on file  Lifestyle  . Physical activity:    Days per week: Not on file    Minutes per session: Not on file  . Stress: Not on file  Relationships  . Social connections:    Talks on phone: Not on file    Gets together: Not on file    Attends religious service: Not on file    Active member of club or organization: Not on file    Attends meetings of clubs or organizations: Not on file    Relationship status: Not on file  . Intimate partner violence:    Fear of current or ex partner: Not on file    Emotionally abused: Not on file    Physically abused: Not on file    Forced sexual activity: Not on file  Other Topics Concern  . Not on file  Social History Narrative   Patient drinks about 2 cups of coffee daily.   Patient is right handed.    Family History:   Family History  Problem Relation Age of Onset  . Dementia Mother   . Heart attack Father        First MI age 1.  Marland Kitchen Heart disease Father 54  . Heart attack Brother        CAD, prior MI  . Hypertension Brother   . ALS Sister       ROS:  Please see the history of present illness.  All other ROS reviewed and negative.     Physical Exam/Data:   Vitals:   05/27/2018 0945 05/27/2018 0946 2018-05-27 1000 27-May-2018 1015  BP: 112/84  139/75 131/76  Pulse: 83  80 70  Resp: 16  16 16   Temp:      TempSrc:      SpO2: (!) 88%  92% 100%  Weight:  88.7 kg    Height:  5\' 11"  (1.803 m)     No intake or output data in  the 24 hours ending 15-May-2018 1029 Filed Weights   2018-05-15 0946  Weight: 88.7 kg   Body mass index is 27.27 kg/m.  General: Critically ill appearing intubated male HEENT: normal Lymph: no adenopathy Neck: no JVD Endocrine:  No thryomegaly Vascular: No carotid bruits; FA pulses 2+ bilaterally without bruits  Cardiac:  normal S1, S2; RRR; no murmur Lungs:  clear to auscultation bilaterally, no wheezing, rhonchi or rales  Abd: soft, nontender, no hepatomegaly  Ext:  R ray amp R 4th and 5th toes Musculoskeletal:  No deformities, BUE and BLE strength normal and equal Skin: warm and dry  Neuro:   no focal abnormalities noted Psych:  Intubated   EKG:  The EKG was personally reviewed and demonstrates: Sinus rhythm with chronic right bundle branch block  Relevant CV Studies:  Cath 09/2010 DETAILS OF PROCEDURE:  The patient was brought to the outpatient cardiac catheterization laboratory after signing informed consent for the procedure.  The right groin was prepped and draped in a sterile fashion. A 1% lidocaine was used for local anesthesia.  A 4-French sheath was inserted into the right femoral artery without difficulty.  Standard diagnostic catheters were used to perform selective coronary angiography.  A pigtail catheter was used to perform a left ventricular angiogram.  The patient tolerated the procedure well and was taken to the recovery area in stable condition.  HEMODYNAMIC FINDINGS:  Central aortic pressure 144/74.  Left ventricular pressure 146/9/25.  ANGIOGRAPHIC FINDINGS: 1. The left main  coronary artery had no evidence of disease. 2. Left anterior descending was a large vessel that coursed to the     apex and gave off several diagonal branches.  There appeared to be     mild 20% stenosis in the proximal left anterior descending artery. 3. Circumflex artery had no obstructive disease. 4. The right coronary artery had mild proximal 20% stenosis. 5. Left ventricular angiogram was performed in the RAO projection and     showed normal left ventricular systolic function with an ejection     fraction of 60%.  IMPRESSION: 1. Mild nonobstructive coronary artery disease. 2. Normal left ventricular systolic function.  Laboratory Data:  Chemistry Recent Labs  Lab May 15, 2018 0945  NA 138  K 4.7  CL 112*  GLUCOSE 127*  BUN 29*  CREATININE 3.50*    No results for input(s): PROT, ALBUMIN, AST, ALT, ALKPHOS, BILITOT in the last 168 hours. Hematology Recent Labs  Lab 05/15/2018 0940 2018/05/15 0945  WBC 9.7  --   RBC 4.00*  --   HGB 10.9* 12.2*  HCT 39.4 36.0*  MCV 98.5  --   MCH 27.3  --   MCHC 27.7*  --   RDW 14.2  --   PLT 155  --    Cardiac EnzymesNo results for input(s): TROPONINI in the last 168 hours.  Recent Labs  Lab 05/15/18 0944  TROPIPOC 17.69*     Radiology/Studies:  Dg Chest Portable 1 View  Result Date: May 15, 2018 CLINICAL DATA:  Status post cardiac arrest, intubated patient. Assess support apparatus. EXAM: PORTABLE CHEST 1 VIEW COMPARISON:  PA and lateral chest x-ray of September 17, 2010 FINDINGS: The lungs are well-expanded. There is no pneumothorax nor large pleural effusion. The retrocardiac region medially on the left is increased in density. The cardiac silhouette is mildly enlarged. The pulmonary vascularity is mildly engorged and indistinct. The endotracheal tube tip projects 4 cm above the carina. The esophagogastric tube tip projects below the inferior margin of the image.  The proximal port may be obscured by a cardiac monitoring Lee. External  pacemaker defibrillator pads are present. IMPRESSION: The observed support devices are in reasonable position with exception of the nasogastric tube whose tip in proximal port are not clearly evident. A lower thoracic-upper abdominal radiograph is recommended. Mild enlargement of the cardiac silhouette and pulmonary vascular engorgement. Probable mild atelectatic change at the left lung base medially. Electronically Signed   By: David  [place] M.D.   On: 05-15-2018 09:26    Assessment and Plan:   1. Cardiac arrest -ROSC x 2.  Currently requiring external pacing.  Troponin and lactic acid elevated.  EKG without acute changes.  It is possible that patient might have a MI.  Difficult situation as patient does not have any living well or power attorney despite daughter works as a Chief Technology Officer for occupation.  Daughter and sister has defer to take patient for immediate cardiac catheterization or temporary pacer wire.  They will make further decision after son's arrival.  Meanwhile, patient will remain on external pacer under ER physician care.   For questions or updates, please contact [facility] Please consult [email or web address] for contact info under     Signed, Manson Passey, PA  05-15-2018 10:29 AM   Agree with note by Chelsea Aus PA-C  We were asked to see Mr. Chamar Broughton with witnessed cardiac arrest he was in the emergency room intubated, hypotensive with a transcutaneous pacemaker.  His underlying rhythm is junctional at 15-20.  He does have chronic renal insufficiency and PAD status post recent peripheral procedure performed using CO2.  He apparently had a witnessed arrest by his sister who did CPR.  When EMS arrived he was asystolic but responded to epinephrine.  His EKG showed right bundle branch block.  His labs are remarkable for fairly normal electrolytes, elevated creatinine, elevated troponin and high serum lactate level.  After a long discussion with the patient family including  his sister and daughter who apparently is a healthcare power of attorney attorney the decision was not to bring him to the Cath Lab for temporary pacemaker or cardiac catheterization.  He was placed on pressors which resulted in both systolic blood pressure from a systolic in the past 120-130 although his underlying rhythm with the pacemaker turned off with still junctional bradycardia with a ventricular response of 15.  Runell Gess, M.D., FACP, [facility], [facility] [address] [place], [place]  [address]  [phone] 05-15-2018 12:59 PM

## 2018-06-07 NOTE — ED Notes (Signed)
Levophed decreased to 2.5

## 2018-06-07 NOTE — ED Notes (Signed)
Cards paged to [phone]-per Dr. Marena ChancyLong-paged by Marylene LandAngela

## 2018-06-07 NOTE — ED Notes (Signed)
Dr. Allyson SabalBerry at beside.  0932 Levophed stared in right tib IO at 10mcg.  0930 Dr. Nat MathBerry  Spoke with daughter.

## 2018-06-07 NOTE — Procedures (Signed)
Extubation Procedure Note  Patient Details:   Name: Brandon Boresroy Reicher Jr. DOB: 11/07/1940 MRN: [id number]   Airway Documentation:    Vent end date: (not recorded) Vent end time: (not recorded)   Evaluation  O2 sats: monitor turned on comfort care, one way extubation Complications: Complications of one way extubation per patient/family wishes Patient did tolerate procedure well. Bilateral Breath Sounds: Clear, Diminished   No   One way extubation per family wishes. Patient is comfort care only. RT will continue to monitor.   [name] [name] 06/01/2018, 11:15 AM

## 2018-06-07 NOTE — ED Notes (Signed)
Dr. Jacqulyn BathLong at bedside very weak pulse

## 2018-06-07 NOTE — ED Provider Notes (Signed)
Emergency Department Provider Note   I have reviewed the triage vital signs and the nursing notes.   HISTORY  Chief Complaint No chief complaint on file.   HPI [name]. is a 78 y.o. male with PMH of HTN, HLD, PVD, and CAD presents to the emergency department status post cardiac arrest.  The vision had a witnessed arrest by family.  He has been complaining of some shortness of breath prior.  Family began CPR while the patient was in bed.  On arrival, EMS reports asystole rhythm.  She received 3 epinephrines and developed a junctional bradycardia.  They began external pacing and transported to the emergency department.  They had loss of pulses and required a brief episode of CPR in route and achieved ROSC prior to ED arrival. [facility] airway in place.   Level 5 caveat: Intubation    Past Medical History:  Diagnosis Date  . Carotid artery occlusion   . Colon polyps   . Degenerative arthritis of spine   . Diverticulosis   . Hemorrhoids   . Hemorrhoids   . Hyperlipidemia   . Hypertension    2010  . Ischemia of foot 08/2017  . PVD (peripheral vascular disease) (HCC)   . Sciatica   . Solar keratosis   . Vitamin B12 deficiency     Patient Active Problem List   Diagnosis Date Noted  . PVD (peripheral vascular disease) (HCC) 03/15/2018  . Itching 10/28/2017  . Postoperative seroma of subcutaneous tissue after non-dermatologic procedure 10/12/2017  . Onychodystrophy 10/12/2017  . 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 Gangrene of right foot (HCC) 08/24/2017  . 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 Acute renal failure superimposed on stage 3 chronic kidney disease (HCC)   . PAD (peripheral artery disease) (HCC) 08/23/2017  . Peripheral vascular disease (HCC)   . Preoperative clearance   . ARF (acute renal failure) (HCC) 08/16/2017  . Ischemic foot 08/15/2017  . Infection of right foot 08/09/2017  . Depressed  mood 08/09/2017  . Allergic conjunctivitis 08/23/2016  . 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 Abnormality of gait 01/27/2015  . Chronic diastolic heart failure (HCC) 01/02/2015  . Preventive measure 01/05/2013  . Occlusion and stenosis of carotid artery without mention of cerebral infarction 11/12/2011  . Carotid stenosis 04/16/2011  . Lumbar degenerative disc disease 08/03/2010  . HEMORRHOIDS 03/11/2009  . Hyperlipidemia 01/29/2009  . CIGARETTE SMOKER 10/04/2008  . Essential hypertension, benign 10/04/2008    Past Surgical History:  Procedure Laterality Date  . ABDOMINAL AORTOGRAM W/LOWER EXTREMITY N/A 08/18/2017   Procedure: ABDOMINAL AORTOGRAM W/LOWER EXTREMITY;  Surgeon: [name], MD;  Location: [facility] INVASIVE CV LAB;  Service: Cardiovascular;  Laterality: N/A;  rt .lower extermity  . ABDOMINAL AORTOGRAM W/LOWER EXTREMITY N/A 03/15/2018   Procedure: ABDOMINAL AORTOGRAM W/LOWER EXTREMITY;  Surgeon: Cephus Shelling, MD;  Location: [facility] INVASIVE CV LAB;  Service: Cardiovascular;  Laterality: N/A;  . 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 BYPASS GRAFT POPLITEAL TO POPLITEAL Right 08/23/2017   Procedure: BYPASS GRAFT ABOVE THE KNEE POPLITEAL TO BELOW THE KNEE POPLITEAL RIGHT USING PROPATEN GORE VASCULAR GRAFT X ;  Surgeon: [name], MD;  Location: [facility] OR;  Service: Vascular;  Laterality: Right;  . CAROTID ENDARTERECTOMY  09/21/10   Right CEA  . NECK MASS EXCISION    . PERIPHERAL VASCULAR INTERVENTION Right 03/15/2018   Procedure: PERIPHERAL VASCULAR INTERVENTION;  Surgeon: Cephus Shelling, MD;  Location: [facility] INVASIVE CV LAB;  Service: Cardiovascular;  Laterality: Right;   Allergies Pollen extract  Family History  Problem Relation Age of  Onset  . Dementia Mother   . Heart attack Father        First MI age 37.  Marland Kitchen Heart disease Father 47  . Heart attack Brother        CAD, prior MI  . Hypertension Brother   . ALS Sister     Social History Social History   Tobacco Use  . Smoking status: Current Every Day Smoker    Packs/day: 1.00    Years: 50.00    Pack years: 50.00    Types: Cigarettes  . Smokeless tobacco: Never Used  Substance Use Topics  . Alcohol use: Yes    Alcohol/week: 0.0 standard drinks    Comment: Patient drinks 3 beers daily  . Drug use: No    Review of Systems  Level 5 caveat: intubated   ____________________________________________   PHYSICAL EXAM:  VITAL SIGNS: Vitals:   06-Jun-2018 1030 06/06/2018 1045  BP: 133/71 (!) 129/16  Pulse: 70 70  Resp: 16 16  Temp:    SpO2: 100% 100%    Constitutional: Somnolent. Occasional respiratory but not making purposeful movements.  Eyes: Conjunctivae are normal. Pupils are 4 mm and fixed.  Head: Atraumatic. Nose: No congestion/rhinnorhea. Mouth/Throat: Mucous membranes are dry.  Neck: No stridor.  Cardiovascular: Bradycardia to the 20s and 30s. External pacing in progress. Cool extremities.  Respiratory: Intubated with King airway. Good aeration.  Gastrointestinal:  No distention.  Musculoskeletal: No gross deformities of extremities. Neurologic: GCS 2 T Skin:  Skin is cool, dry, and intact.   ____________________________________________   LABS (all labs ordered are listed, but only abnormal results are displayed)  Labs Reviewed  COMPREHENSIVE METABOLIC PANEL - Abnormal; Notable for the following components:      Result Value   CO2 12 (*)    Glucose, Bld 133 (*)    BUN 27 (*)    Creatinine, Ser 4.19 (*)    Calcium 8.0 (*)    Total Protein 5.7 (*)    Albumin 1.9 (*)    AST 661 (*)    ALT 219 (*)    Alkaline Phosphatase 188 (*)    GFR calc non Af Amer 13 (*)    GFR calc Af Amer 15 (*)    Anion gap 24 (*)    All other components  within normal limits  CBC WITH DIFFERENTIAL/PLATELET - Abnormal; Notable for the following components:   RBC 4.00 (*)    Hemoglobin 10.9 (*)    MCHC 27.7 (*)    Abs Immature Granulocytes 0.16 (*)    All other components within normal limits  PROTIME-INR - Abnormal; Notable for the following components:   Prothrombin Time 16.7 (*)    All other components within normal limits  I-STAT TROPONIN, ED - Abnormal; Notable for the following components:   Troponin i, poc 17.69 (*)    All other components within normal limits  I-STAT CG4 LACTIC ACID, ED - Abnormal; Notable for the following components:   Lactic Acid, Venous 13.97 (*)    All other components within normal limits  I-STAT CHEM 8, ED - Abnormal; Notable for the following components:  Chloride 112 (*)    BUN 29 (*)    Creatinine, Ser 3.50 (*)    Glucose, Bld 127 (*)    Calcium, Ion 0.87 (*)    TCO2 12 (*)    Hemoglobin 12.2 (*)    HCT 36.0 (*)    All other components within normal limits  I-STAT VENOUS BLOOD GAS, ED - Abnormal; Notable for the following components:   pH, Ven 6.976 (*)    pCO2, Ven 65.8 (*)    Bicarbonate 15.4 (*)    TCO2 17 (*)    Acid-base deficit 17.0 (*)    All other components within normal limits  MAGNESIUM  TSH  I-STAT CG4 LACTIC ACID, ED   ____________________________________________  EKG  Rate: 25 Junctional rhythm.  RBBB. No ST elevation or depression. No STEMI.  ____________________________________________  RADIOLOGY  Dg Chest Portable 1 View  Result Date: 05/13/2018 CLINICAL DATA:  Status post cardiac arrest, intubated patient. Assess support apparatus. EXAM: PORTABLE CHEST 1 VIEW COMPARISON:  PA and lateral chest x-ray of September 17, 2010 FINDINGS: The lungs are well-expanded. There is no pneumothorax nor large pleural effusion. The retrocardiac region medially on the left is increased in density. The cardiac silhouette is mildly enlarged. The pulmonary vascularity is mildly engorged and  indistinct. The endotracheal tube tip projects 4 cm above the carina. The esophagogastric tube tip projects below the inferior margin of the image. The proximal port may be obscured by a cardiac monitoring Lee. External pacemaker defibrillator pads are present. IMPRESSION: The observed support devices are in reasonable position with exception of the nasogastric tube whose tip in proximal port are not clearly evident. A lower thoracic-upper abdominal radiograph is recommended. Mild enlargement of the cardiac silhouette and pulmonary vascular engorgement. Probable mild atelectatic change at the left lung base medially. Electronically Signed   By: David  SwazilandJordan M.D.   On: 12-May-2018 09:26    ____________________________________________   PROCEDURES  Procedure(s) performed:   .Critical Care Performed by: Maia PlanLong, Izic Stfort G, MD Authorized by: Maia PlanLong, Destynee Stringfellow G, MD   Critical care provider statement:    Critical care time (minutes):  35   Critical care time was exclusive of:  Separately billable procedures and treating other patients and teaching time   Critical care was necessary to treat or prevent imminent or life-threatening deterioration of the following conditions:  Respiratory failure, circulatory failure and cardiac failure   Critical care was time spent personally by me on the following activities:  Blood draw for specimens, development of treatment plan with patient or surrogate, discussions with consultants, evaluation of patient's response to treatment, examination of patient, obtaining history from patient or surrogate, ordering and performing treatments and interventions, ordering and review of laboratory studies, ordering and review of radiographic studies, pulse oximetry, re-evaluation of patient's condition and review of old charts   I assumed direction of critical care for this patient from another provider in my specialty: no   Date/Time: 05/08/2018 12:27 PM Performed by: Maia PlanLong, Judia Arnott G,  MD Pre-anesthesia Checklist: Patient identified, Emergency Drugs available, Suction available and Patient being monitored Oxygen Delivery Method: Nasal cannula Preoxygenation: Pre-oxygenation with 100% oxygen Induction Type: Rapid sequence Laryngoscope Size: Glidescope and 4 Grade View: Grade II Tube size: 8.0 mm Number of attempts: 1 Placement Confirmation: ETT inserted through vocal cords under direct vision,  CO2 detector,  Breath sounds checked- equal and bilateral and Positive ETCO2 Secured at: 26 cm Tube secured with: ETT holder Difficulty Due To: Difficult Airway-  due to  edematous airway, Difficult Airway- due to limited oral opening and Difficult Airway- due to dentition    External pacer Date/Time: 05-24-2018 12:28 PM Performed by: Maia Plan, MD Authorized by: Maia Plan, MD  Consent: The procedure was performed in an emergent situation. Preparation: Patient was prepped and draped in the usual sterile fashion. Local anesthesia used: no  Anesthesia: Local anesthesia used: no  Sedation: Patient sedated: yes Sedatives: fentanyl  Patient tolerance: Patient tolerated the procedure well with no immediate complications Comments: 200 mAmp with capture at rate of 70      ____________________________________________   INITIAL IMPRESSION / ASSESSMENT AND PLAN / ED COURSE  Pertinent labs & imaging results that were available during my care of the patient were reviewed by me and considered in my medical decision making (see chart for details).  Patient arrives to the ER status post a cardiac arrest.  External pacing in progress.  Underlying rhythm appears junctional with rate in the 20s.  Patient paced with good capture at bedside.  Patient was intubated without difficulty.  Some spontaneous respirations prior to intubation.  Sending labs and will follow chest x-ray.  I paged cardiology to discuss pacemaker.  Patient given fentanyl infusion while intubated. No  family at bedside but told they are en route with EMS.   Consulted cardiology with bradycardia. Dr. Allyson Sabal offered family a heart cath and pacemaker. After discussion they decided that patient would not want this intervention. They will wait for son to arrive and decide on possible comfort care measures. Labs reviewed with elevated lactate, troponin, and acidosis on VBG consistent with cardiac arrest. Continue to provide supportive care at this time.   11:00 AM Spoke with family (brother and sister) at bedside. They elect for comfort care measures at this time. Would like to extubate here and discontinue external pacing/pressors. They note that the patient would not want this intervention. Will withdrawal care and start morphine drip for comfort in accordance with family wishes.   11:25 AM Patient with asystole on monitor. No spontaneous respirations. Time of death 11:25 AM. No ME case.  ____________________________________________  FINAL CLINICAL IMPRESSION(S) / ED DIAGNOSES  Final diagnoses:  Cardiac arrest (HCC)  Bradycardia     MEDICATIONS GIVEN DURING THIS VISIT:  Medications  fentaNYL in NS (69mcg/ml) infusion-PREMIX (has no administration in time range)  sodium chloride 0.9 % bolus 1,000 mL (has no administration in time range)  fentaNYL in NS (49mcg/ml) infusion-PREMIX (has no administration in time range)  morphine 4 MG/ML injection 6 mg (has no administration in time range)  morphine 100mg  in NS (1mg /mL) infusion - premix (has no administration in time range)  norepinephrine (LEVOPHED) 4-5 MG/250ML-% infusion SOLN (has no administration in time range)  etomidate (AMIDATE) injection (20 mg Intravenous Given May 24, 2018 0951)  rocuronium (ZEMURON) injection (100 mg Intravenous Given 05/24/2018 0907)  morphine 4 MG/ML injection 6 mg (6 mg Intravenous Given 2018/05/24 1106)    Note:  This document was prepared using Dragon voice recognition software and  may include unintentional dictation errors.  Alona Bene, MD Emergency Medicine    Ethelean Colla, Arlyss Repress, MD 05-24-18 1235

## 2018-06-07 NOTE — ED Notes (Signed)
Daughter at bedside.

## 2018-06-07 NOTE — ED Notes (Signed)
Patient has IO right tib. Per ems.

## 2018-06-07 NOTE — ED Notes (Signed)
Resp at bedside ET tube pulled and pacer turned off.

## 2018-06-07 DEATH — deceased

## 2019-03-18 IMAGING — CR DG ELBOW COMPLETE 3+V*R*
4 series · 4 of 4 positions shown · non-contrast
Comparison: None.

CLINICAL DATA: Fell with pain in the elbow, dizziness

EXAM:
RIGHT ELBOW - COMPLETE 3+ VIEW

[x elbow ap right]
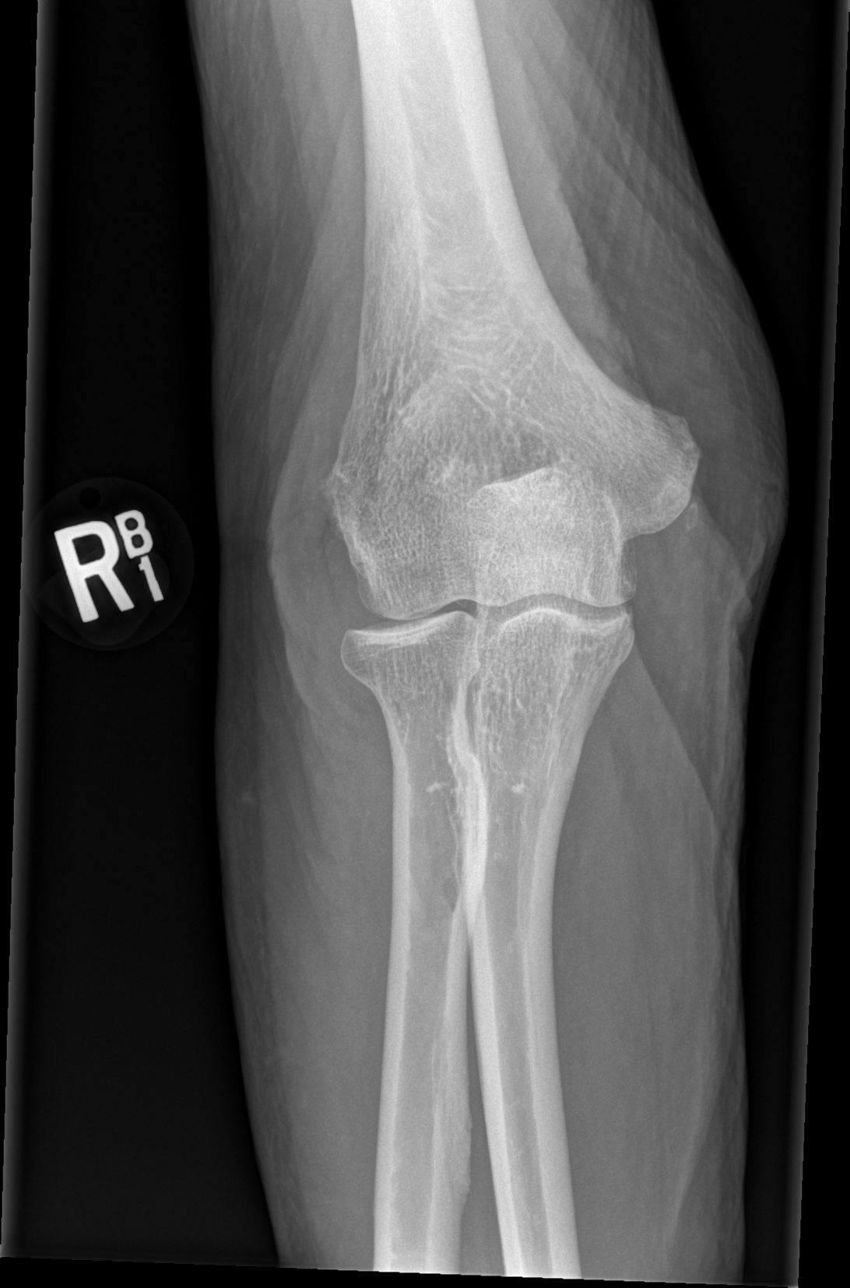

[x elbow obl right (1 of 2)]
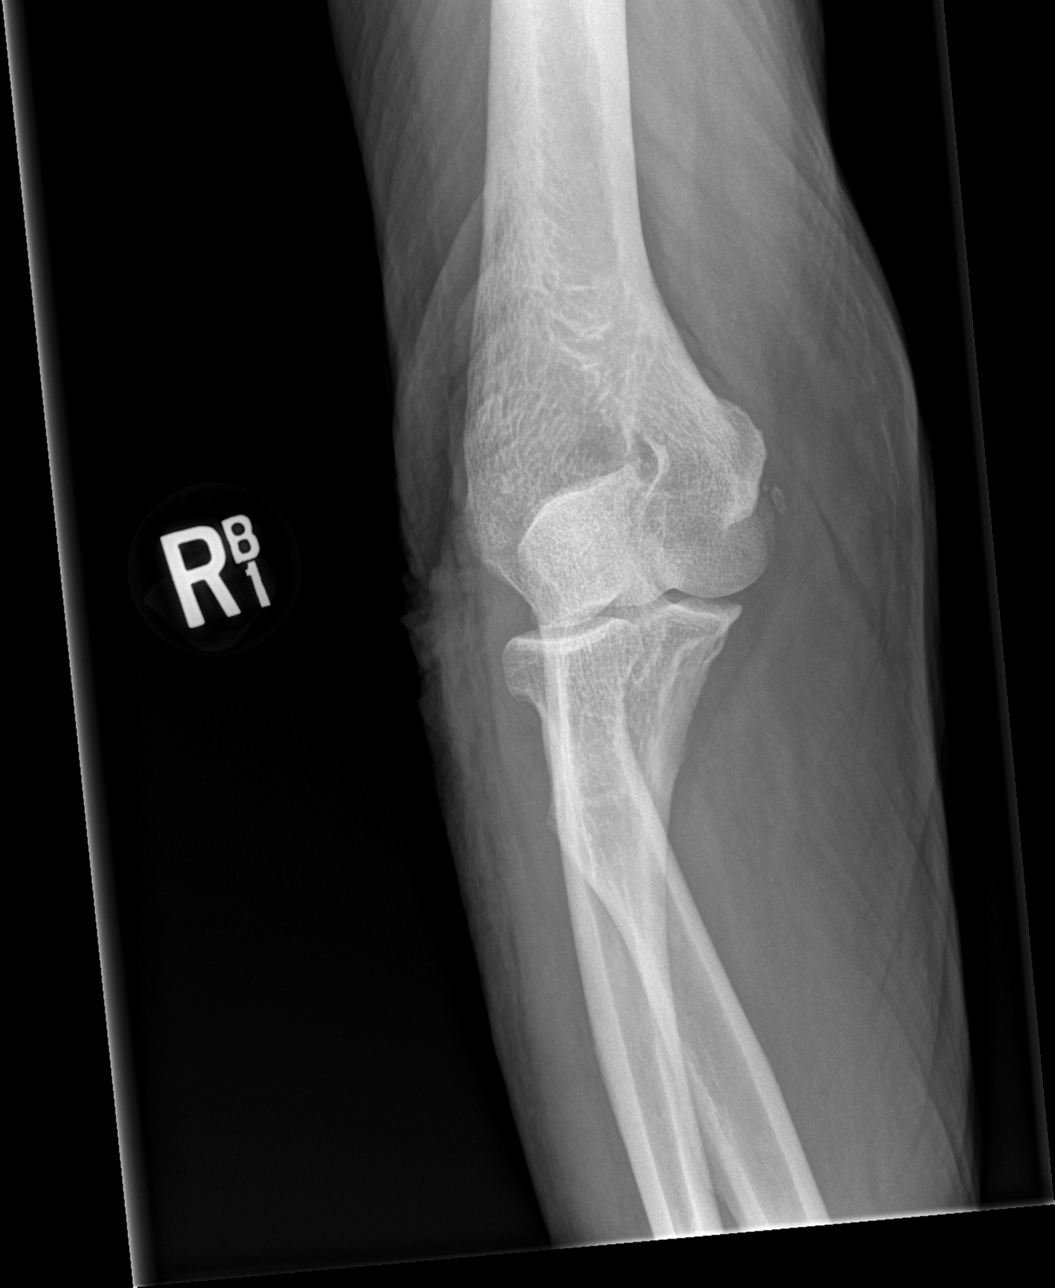

[x elbow obl right (2 of 2)]
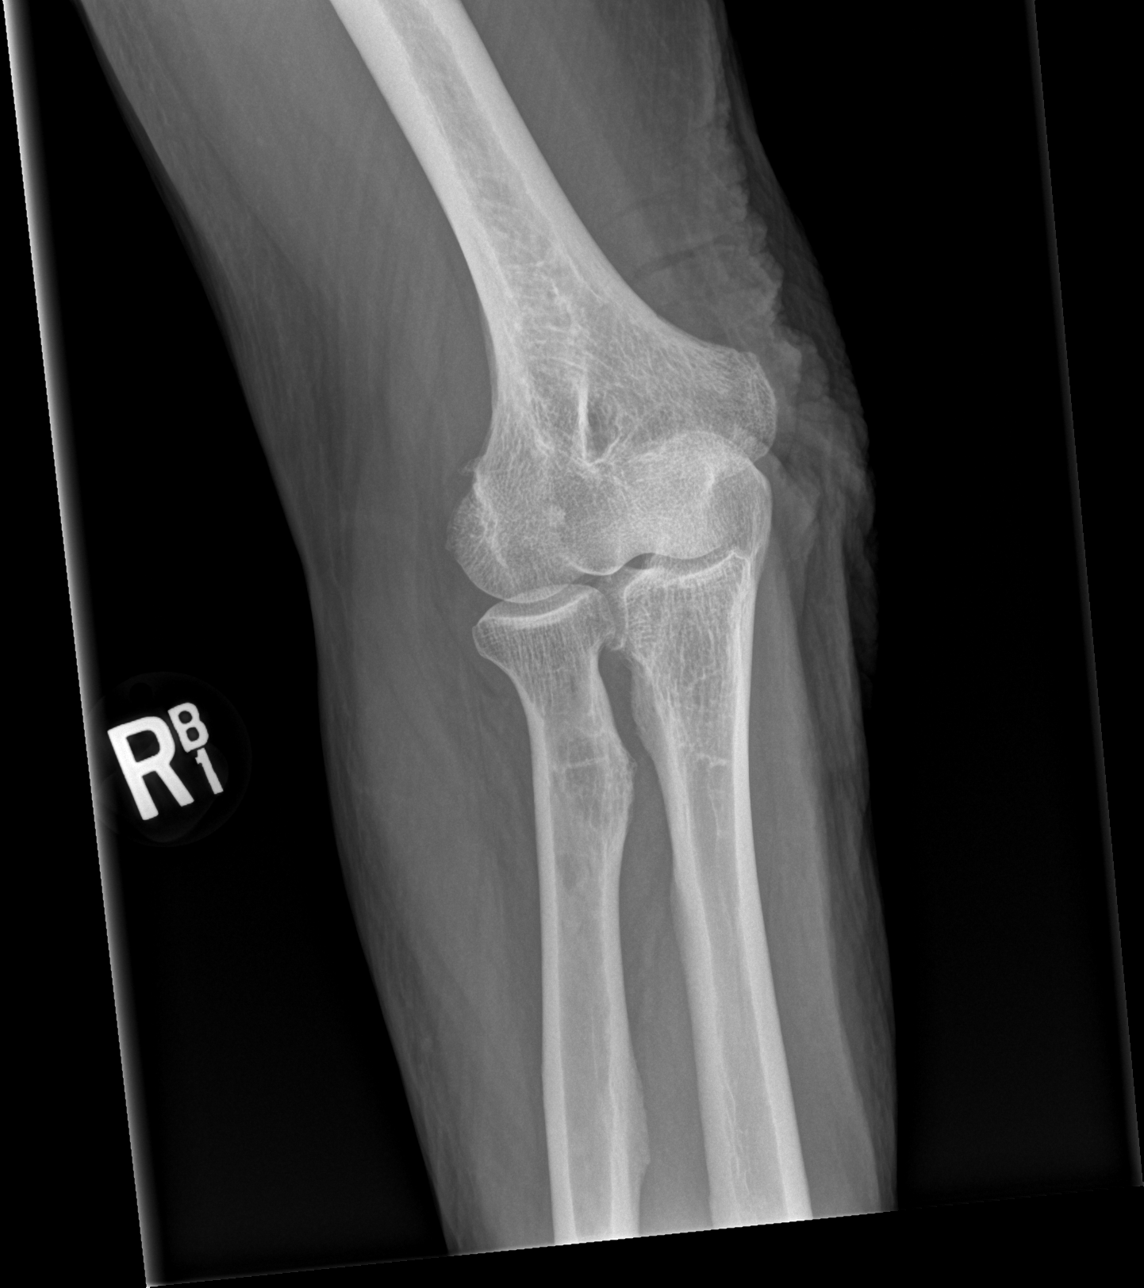

[x elbow lat right]
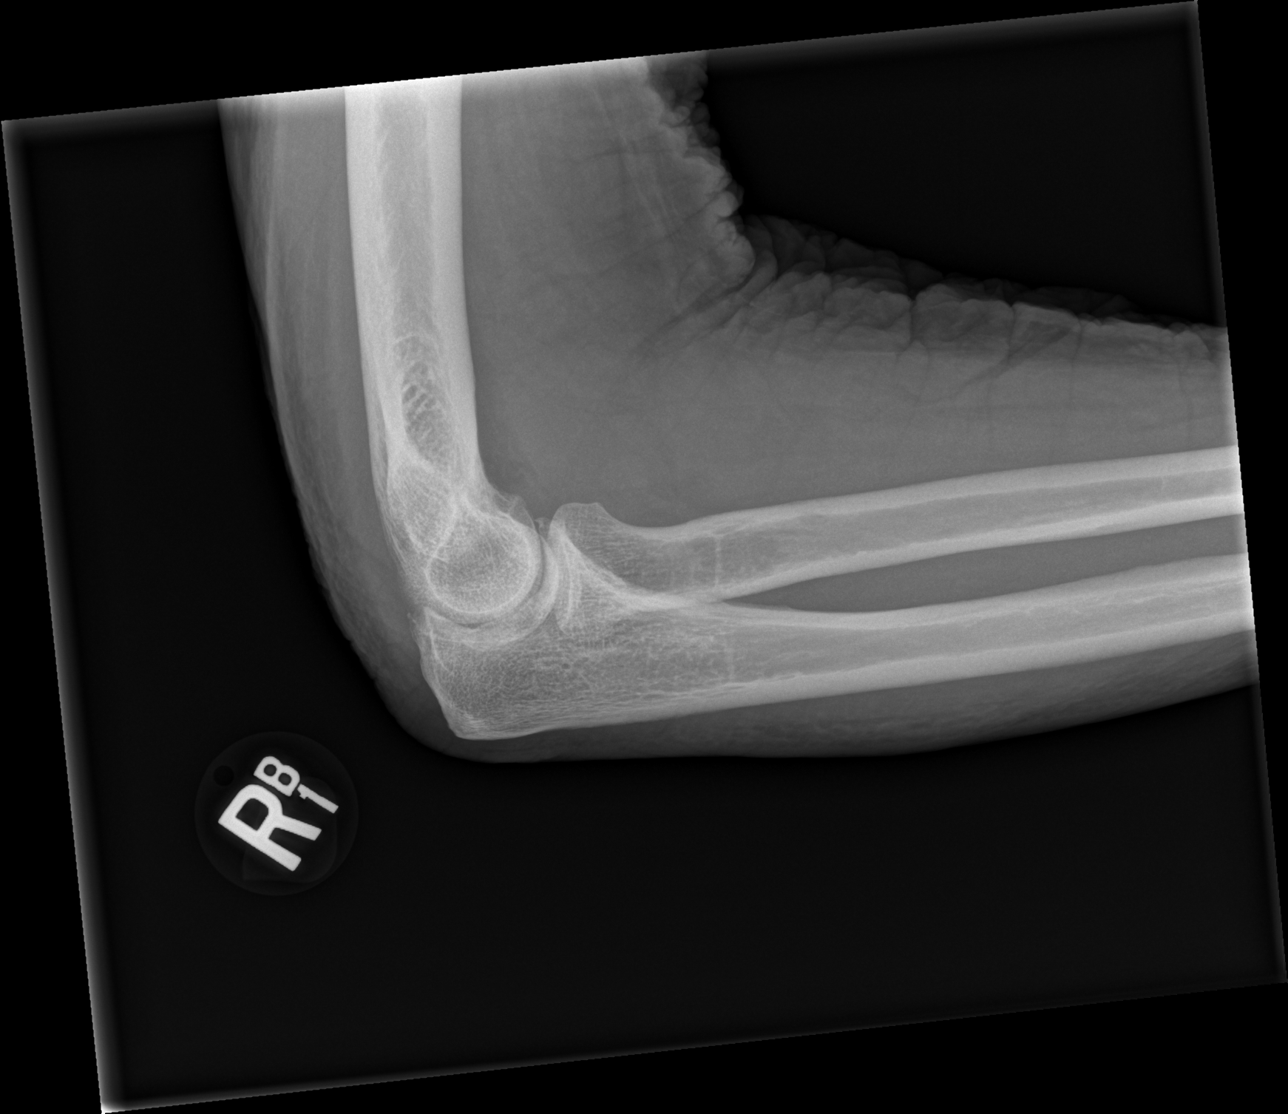

[4 of 4 positions shown; findings below may reference images not displayed]

FINDINGS: No acute fracture is seen. Alignment is normal. Only mild
degenerative spurring is present. No joint effusion is seen.
IMPRESSION: Mild degenerative change.  No acute abnormality.

## 2019-03-19 IMAGING — US US RENAL
1 series · 14 of 25 positions shown · non-contrast
Comparison: None.

CLINICAL DATA: Chronic renal insufficiency.

EXAM:
RENAL / URINARY TRACT ULTRASOUND COMPLETE

[Series 1: us renal · 0.24mm/px · 14 of 35 slices shown]
[im 1/35]
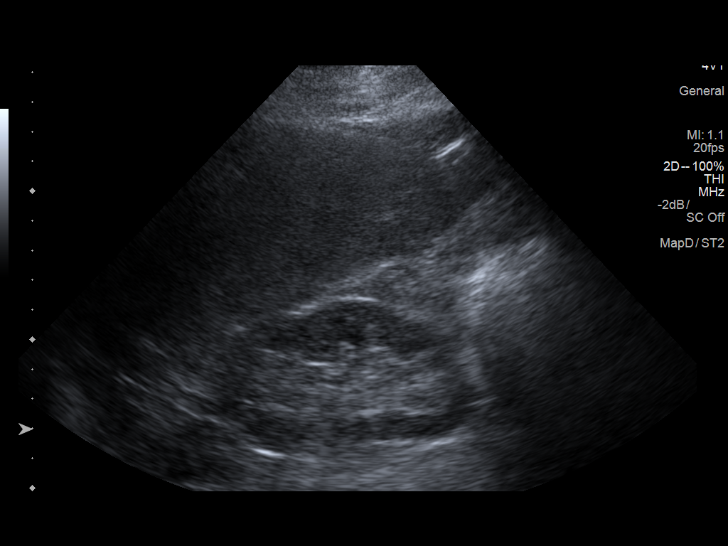
[im 3/35]
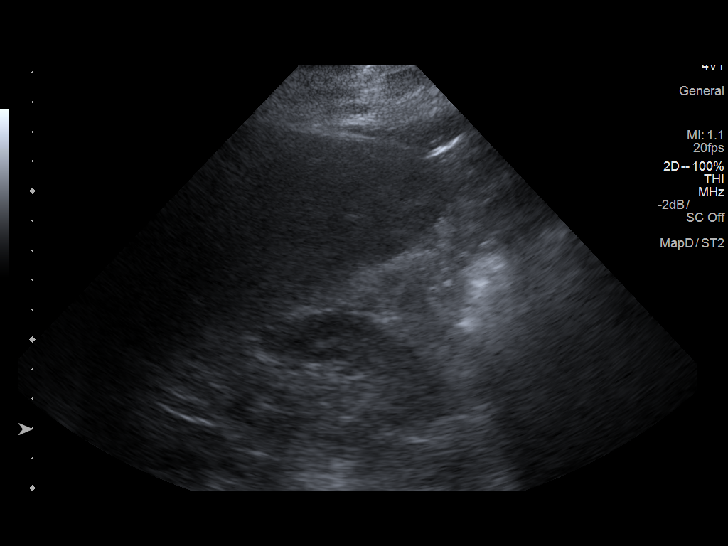
[im 6/35]
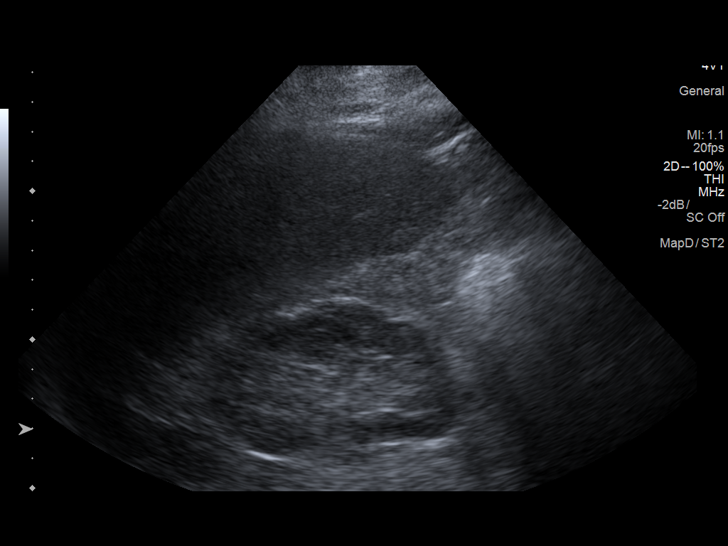
[im 9/35]
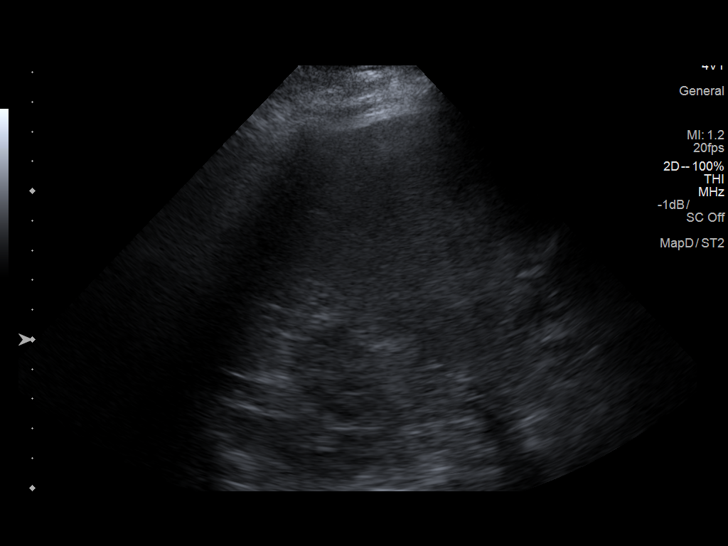
[im 12/35]
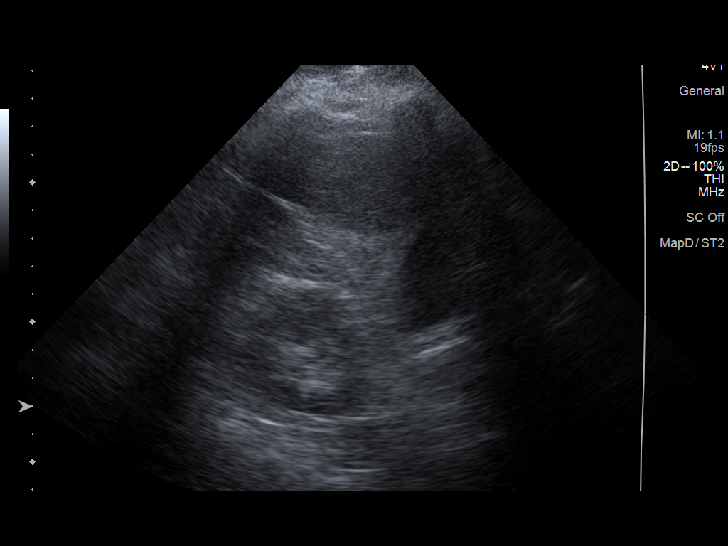
[im 13/35]
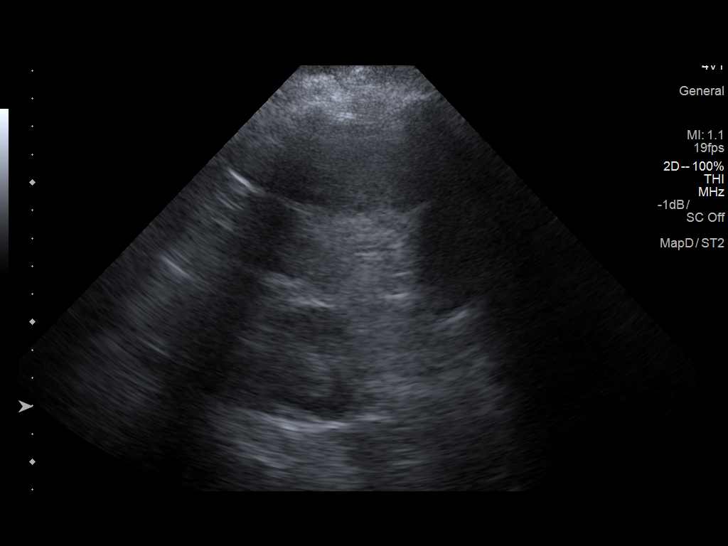
[im 16/35]
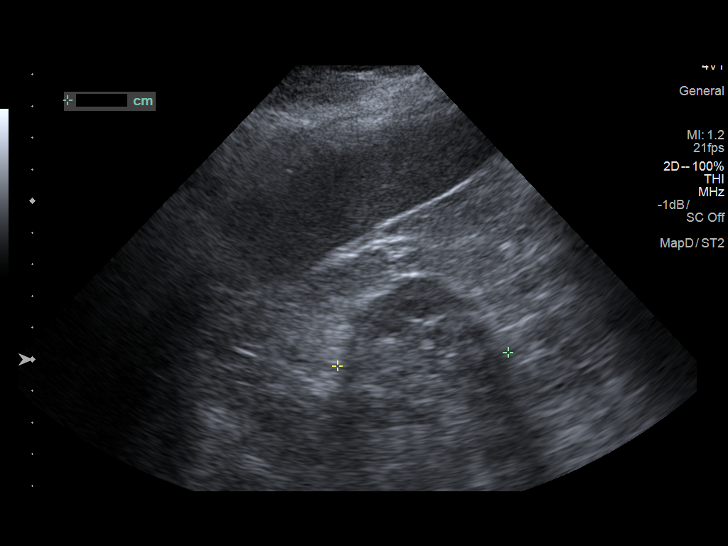
[im 19/35]
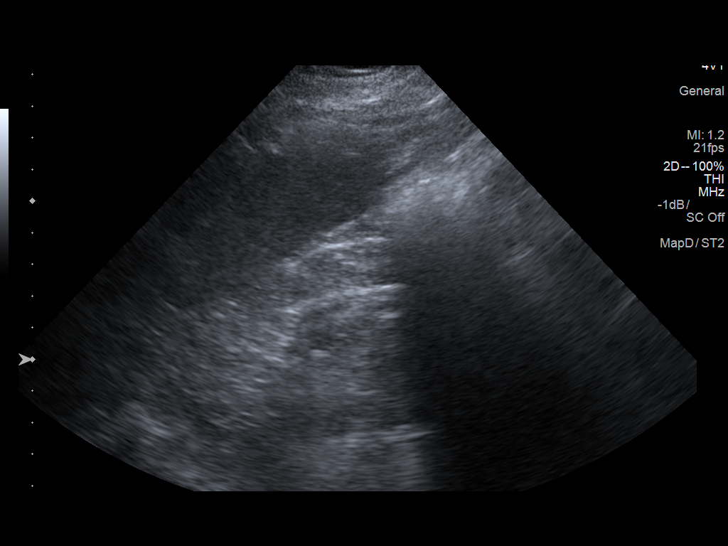
[im 22/35]
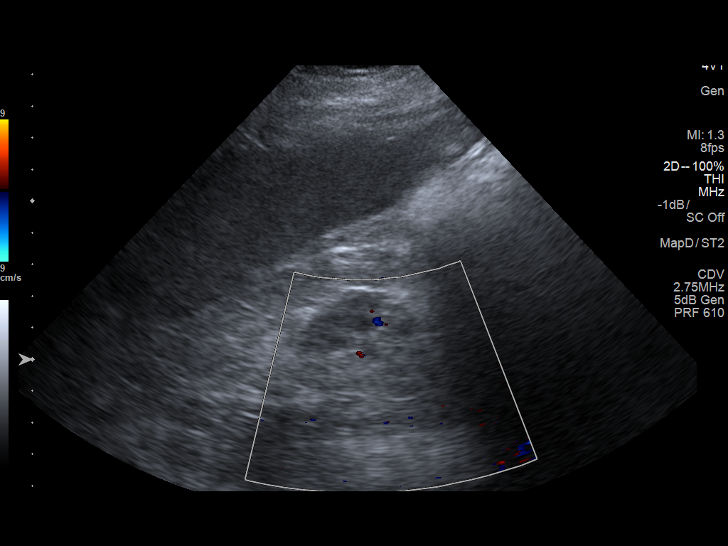
[im 23/35]
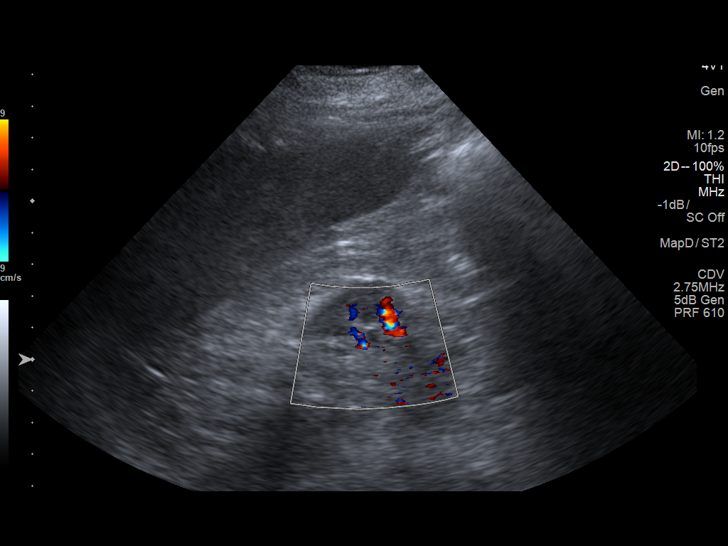
[im 26/35]
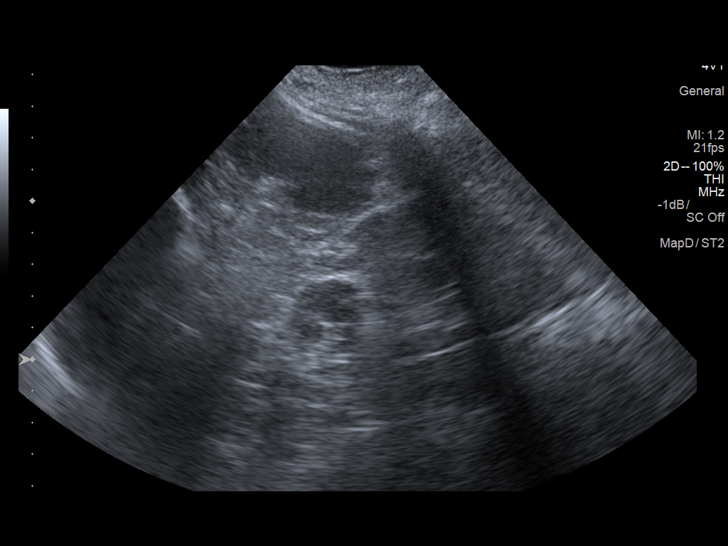
[im 29/35]
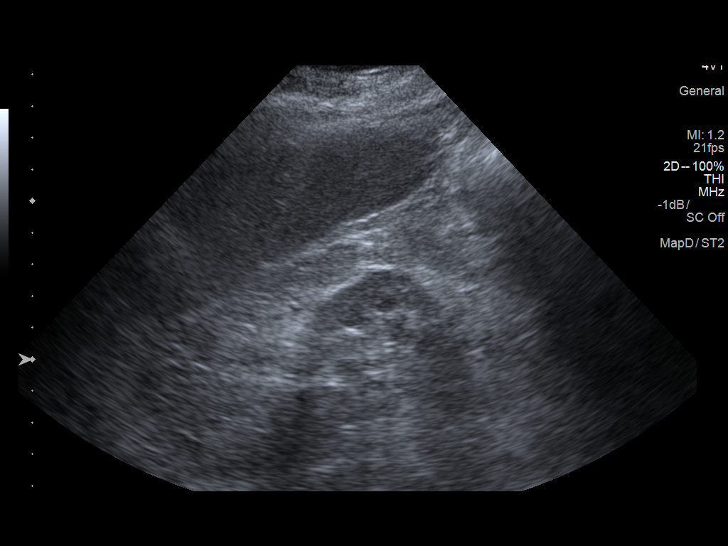
[im 32/35]
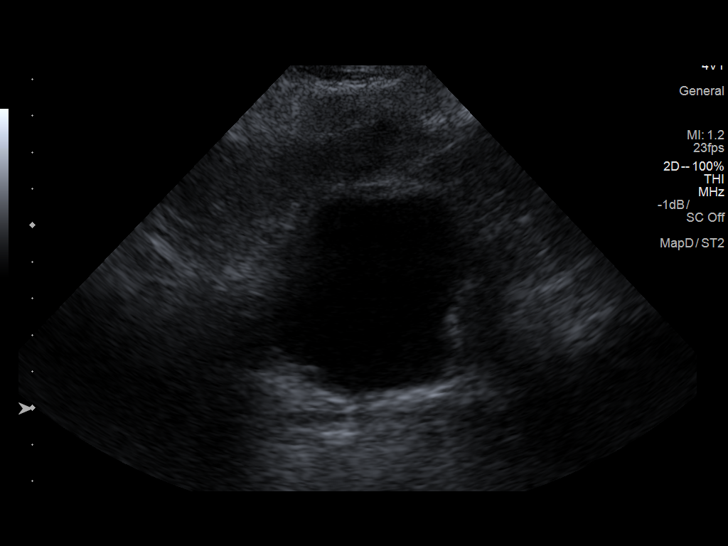
[im 35/35]
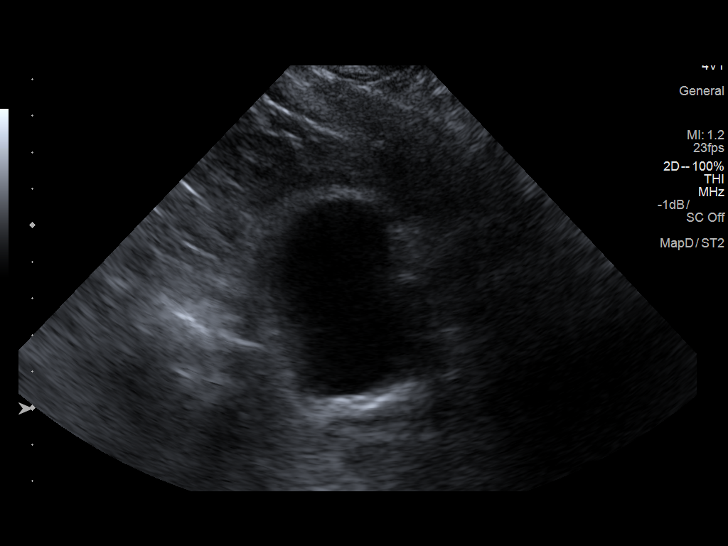

[14 of 25 positions shown; findings below may reference images not displayed]

FINDINGS: Right Kidney:

Length: 8.9 cm. Echogenicity within normal limits. No mass or
hydronephrosis visualized.

Left Kidney:

Length: 6 cm. The left kidney is atrophic with diffuse increased
echotexture. No mass or hydronephrosis visualized.

Bladder:

Appears normal for degree of bladder distention.
IMPRESSION: Marked atrophic left kidney.  No acute abnormality identified.
